# Patient Record
Sex: Male | Born: 1935 | Race: White | Hispanic: No | Marital: Married | Smoking: Former smoker
Health system: Southern US, Community
[De-identification: ages and names within clinical notes are randomized; demographics above are authoritative.]

## PROBLEM LIST (undated history)

## (undated) DIAGNOSIS — N400 Enlarged prostate without lower urinary tract symptoms: Secondary | ICD-10-CM

## (undated) DIAGNOSIS — K219 Gastro-esophageal reflux disease without esophagitis: Secondary | ICD-10-CM

## (undated) DIAGNOSIS — G629 Polyneuropathy, unspecified: Secondary | ICD-10-CM

## (undated) DIAGNOSIS — I639 Cerebral infarction, unspecified: Secondary | ICD-10-CM

## (undated) DIAGNOSIS — I10 Essential (primary) hypertension: Secondary | ICD-10-CM

## (undated) DIAGNOSIS — I6529 Occlusion and stenosis of unspecified carotid artery: Secondary | ICD-10-CM

## (undated) DIAGNOSIS — I4891 Unspecified atrial fibrillation: Secondary | ICD-10-CM

## (undated) DIAGNOSIS — R519 Headache, unspecified: Secondary | ICD-10-CM

## (undated) DIAGNOSIS — H409 Unspecified glaucoma: Secondary | ICD-10-CM

## (undated) DIAGNOSIS — E119 Type 2 diabetes mellitus without complications: Secondary | ICD-10-CM

## (undated) DIAGNOSIS — R51 Headache: Secondary | ICD-10-CM

## (undated) DIAGNOSIS — C801 Malignant (primary) neoplasm, unspecified: Secondary | ICD-10-CM

## (undated) DIAGNOSIS — M1990 Unspecified osteoarthritis, unspecified site: Secondary | ICD-10-CM

## (undated) DIAGNOSIS — I509 Heart failure, unspecified: Secondary | ICD-10-CM

## (undated) HISTORY — PX: OTHER SURGICAL HISTORY: SHX169

## (undated) HISTORY — PX: TRANSURETHRAL RESECTION OF PROSTATE: SHX73

## (undated) HISTORY — PX: CAROTID ENDARTERECTOMY: SUR193

---

## 2004-09-04 ENCOUNTER — Ambulatory Visit: Payer: Self-pay | Admitting: Cardiology

## 2004-09-11 ENCOUNTER — Ambulatory Visit: Payer: Self-pay | Admitting: Cardiology

## 2007-07-06 ENCOUNTER — Ambulatory Visit: Payer: Self-pay | Admitting: Cardiology

## 2011-04-02 DIAGNOSIS — R079 Chest pain, unspecified: Secondary | ICD-10-CM

## 2011-04-02 DIAGNOSIS — I4891 Unspecified atrial fibrillation: Secondary | ICD-10-CM

## 2011-04-03 DIAGNOSIS — I2510 Atherosclerotic heart disease of native coronary artery without angina pectoris: Secondary | ICD-10-CM

## 2011-04-03 DIAGNOSIS — R072 Precordial pain: Secondary | ICD-10-CM

## 2012-02-19 DIAGNOSIS — I639 Cerebral infarction, unspecified: Secondary | ICD-10-CM

## 2012-02-19 HISTORY — DX: Cerebral infarction, unspecified: I63.9

## 2015-07-02 DIAGNOSIS — R0789 Other chest pain: Secondary | ICD-10-CM | POA: Diagnosis not present

## 2015-07-02 DIAGNOSIS — Z7984 Long term (current) use of oral hypoglycemic drugs: Secondary | ICD-10-CM | POA: Diagnosis not present

## 2015-07-02 DIAGNOSIS — I482 Chronic atrial fibrillation: Secondary | ICD-10-CM | POA: Diagnosis not present

## 2015-07-02 DIAGNOSIS — R079 Chest pain, unspecified: Secondary | ICD-10-CM | POA: Diagnosis not present

## 2015-07-02 DIAGNOSIS — I10 Essential (primary) hypertension: Secondary | ICD-10-CM | POA: Diagnosis not present

## 2015-07-02 DIAGNOSIS — Z79899 Other long term (current) drug therapy: Secondary | ICD-10-CM | POA: Diagnosis not present

## 2015-07-02 DIAGNOSIS — E119 Type 2 diabetes mellitus without complications: Secondary | ICD-10-CM | POA: Diagnosis not present

## 2015-07-03 DIAGNOSIS — I517 Cardiomegaly: Secondary | ICD-10-CM | POA: Diagnosis not present

## 2015-07-03 DIAGNOSIS — R079 Chest pain, unspecified: Secondary | ICD-10-CM | POA: Diagnosis not present

## 2015-07-03 DIAGNOSIS — I080 Rheumatic disorders of both mitral and aortic valves: Secondary | ICD-10-CM | POA: Diagnosis not present

## 2015-07-04 DIAGNOSIS — R079 Chest pain, unspecified: Secondary | ICD-10-CM | POA: Diagnosis not present

## 2015-07-11 DIAGNOSIS — M4316 Spondylolisthesis, lumbar region: Secondary | ICD-10-CM | POA: Diagnosis not present

## 2015-07-11 DIAGNOSIS — M47817 Spondylosis without myelopathy or radiculopathy, lumbosacral region: Secondary | ICD-10-CM | POA: Diagnosis not present

## 2015-07-11 DIAGNOSIS — S32009A Unspecified fracture of unspecified lumbar vertebra, initial encounter for closed fracture: Secondary | ICD-10-CM | POA: Diagnosis not present

## 2015-08-01 DIAGNOSIS — Z9889 Other specified postprocedural states: Secondary | ICD-10-CM | POA: Diagnosis not present

## 2015-08-01 DIAGNOSIS — M47817 Spondylosis without myelopathy or radiculopathy, lumbosacral region: Secondary | ICD-10-CM | POA: Diagnosis not present

## 2015-08-01 DIAGNOSIS — M47816 Spondylosis without myelopathy or radiculopathy, lumbar region: Secondary | ICD-10-CM | POA: Diagnosis not present

## 2015-08-01 DIAGNOSIS — M4806 Spinal stenosis, lumbar region: Secondary | ICD-10-CM | POA: Diagnosis not present

## 2015-08-03 DIAGNOSIS — M47817 Spondylosis without myelopathy or radiculopathy, lumbosacral region: Secondary | ICD-10-CM | POA: Diagnosis not present

## 2015-08-03 DIAGNOSIS — M4316 Spondylolisthesis, lumbar region: Secondary | ICD-10-CM | POA: Diagnosis not present

## 2016-05-09 ENCOUNTER — Inpatient Hospital Stay (HOSPITAL_COMMUNITY)
Admission: AD | Admit: 2016-05-09 | Discharge: 2016-05-17 | DRG: 375 | Disposition: A | Discharge status: Home Health | Payer: PPO | Source: Other Acute Inpatient Hospital | Attending: Internal Medicine | Admitting: Internal Medicine

## 2016-05-09 ENCOUNTER — Encounter (HOSPITAL_COMMUNITY): Payer: Self-pay | Admitting: *Deleted

## 2016-05-09 DIAGNOSIS — R933 Abnormal findings on diagnostic imaging of other parts of digestive tract: Secondary | ICD-10-CM | POA: Diagnosis not present

## 2016-05-09 DIAGNOSIS — R112 Nausea with vomiting, unspecified: Secondary | ICD-10-CM | POA: Diagnosis not present

## 2016-05-09 DIAGNOSIS — E119 Type 2 diabetes mellitus without complications: Secondary | ICD-10-CM | POA: Diagnosis not present

## 2016-05-09 DIAGNOSIS — E1142 Type 2 diabetes mellitus with diabetic polyneuropathy: Secondary | ICD-10-CM | POA: Diagnosis not present

## 2016-05-09 DIAGNOSIS — D123 Benign neoplasm of transverse colon: Secondary | ICD-10-CM | POA: Diagnosis not present

## 2016-05-09 DIAGNOSIS — R935 Abnormal findings on diagnostic imaging of other abdominal regions, including retroperitoneum: Secondary | ICD-10-CM | POA: Diagnosis not present

## 2016-05-09 DIAGNOSIS — K8689 Other specified diseases of pancreas: Secondary | ICD-10-CM | POA: Diagnosis not present

## 2016-05-09 DIAGNOSIS — Z79899 Other long term (current) drug therapy: Secondary | ICD-10-CM | POA: Diagnosis not present

## 2016-05-09 DIAGNOSIS — E1159 Type 2 diabetes mellitus with other circulatory complications: Secondary | ICD-10-CM | POA: Diagnosis not present

## 2016-05-09 DIAGNOSIS — I429 Cardiomyopathy, unspecified: Secondary | ICD-10-CM | POA: Diagnosis present

## 2016-05-09 DIAGNOSIS — E1151 Type 2 diabetes mellitus with diabetic peripheral angiopathy without gangrene: Secondary | ICD-10-CM | POA: Diagnosis present

## 2016-05-09 DIAGNOSIS — K56699 Other intestinal obstruction unspecified as to partial versus complete obstruction: Secondary | ICD-10-CM | POA: Diagnosis present

## 2016-05-09 DIAGNOSIS — K6389 Other specified diseases of intestine: Secondary | ICD-10-CM

## 2016-05-09 DIAGNOSIS — I4891 Unspecified atrial fibrillation: Secondary | ICD-10-CM

## 2016-05-09 DIAGNOSIS — K640 First degree hemorrhoids: Secondary | ICD-10-CM | POA: Diagnosis not present

## 2016-05-09 DIAGNOSIS — R1110 Vomiting, unspecified: Secondary | ICD-10-CM | POA: Diagnosis not present

## 2016-05-09 DIAGNOSIS — K639 Disease of intestine, unspecified: Secondary | ICD-10-CM | POA: Diagnosis not present

## 2016-05-09 DIAGNOSIS — C186 Malignant neoplasm of descending colon: Secondary | ICD-10-CM

## 2016-05-09 DIAGNOSIS — C269 Malignant neoplasm of ill-defined sites within the digestive system: Principal | ICD-10-CM | POA: Diagnosis present

## 2016-05-09 DIAGNOSIS — Z01810 Encounter for preprocedural cardiovascular examination: Secondary | ICD-10-CM | POA: Diagnosis not present

## 2016-05-09 DIAGNOSIS — N400 Enlarged prostate without lower urinary tract symptoms: Secondary | ICD-10-CM | POA: Diagnosis present

## 2016-05-09 DIAGNOSIS — R945 Abnormal results of liver function studies: Secondary | ICD-10-CM | POA: Diagnosis not present

## 2016-05-09 DIAGNOSIS — R1902 Left upper quadrant abdominal swelling, mass and lump: Secondary | ICD-10-CM | POA: Diagnosis not present

## 2016-05-09 DIAGNOSIS — C252 Malignant neoplasm of tail of pancreas: Secondary | ICD-10-CM | POA: Diagnosis not present

## 2016-05-09 DIAGNOSIS — R531 Weakness: Secondary | ICD-10-CM | POA: Diagnosis not present

## 2016-05-09 DIAGNOSIS — Z8619 Personal history of other infectious and parasitic diseases: Secondary | ICD-10-CM | POA: Diagnosis not present

## 2016-05-09 DIAGNOSIS — Z886 Allergy status to analgesic agent status: Secondary | ICD-10-CM | POA: Diagnosis not present

## 2016-05-09 DIAGNOSIS — R7989 Other specified abnormal findings of blood chemistry: Secondary | ICD-10-CM | POA: Diagnosis not present

## 2016-05-09 DIAGNOSIS — F0390 Unspecified dementia without behavioral disturbance: Secondary | ICD-10-CM | POA: Diagnosis not present

## 2016-05-09 DIAGNOSIS — Z7901 Long term (current) use of anticoagulants: Secondary | ICD-10-CM

## 2016-05-09 DIAGNOSIS — K5730 Diverticulosis of large intestine without perforation or abscess without bleeding: Secondary | ICD-10-CM | POA: Diagnosis not present

## 2016-05-09 DIAGNOSIS — I255 Ischemic cardiomyopathy: Secondary | ICD-10-CM | POA: Diagnosis not present

## 2016-05-09 DIAGNOSIS — R1012 Left upper quadrant pain: Secondary | ICD-10-CM | POA: Diagnosis not present

## 2016-05-09 DIAGNOSIS — Z7189 Other specified counseling: Secondary | ICD-10-CM

## 2016-05-09 DIAGNOSIS — Z8673 Personal history of transient ischemic attack (TIA), and cerebral infarction without residual deficits: Secondary | ICD-10-CM

## 2016-05-09 DIAGNOSIS — C189 Malignant neoplasm of colon, unspecified: Secondary | ICD-10-CM | POA: Diagnosis not present

## 2016-05-09 DIAGNOSIS — R188 Other ascites: Secondary | ICD-10-CM | POA: Diagnosis not present

## 2016-05-09 DIAGNOSIS — I5022 Chronic systolic (congestive) heart failure: Secondary | ICD-10-CM | POA: Diagnosis present

## 2016-05-09 DIAGNOSIS — Z87891 Personal history of nicotine dependence: Secondary | ICD-10-CM | POA: Diagnosis not present

## 2016-05-09 DIAGNOSIS — E440 Moderate protein-calorie malnutrition: Secondary | ICD-10-CM | POA: Diagnosis not present

## 2016-05-09 DIAGNOSIS — I110 Hypertensive heart disease with heart failure: Secondary | ICD-10-CM | POA: Diagnosis not present

## 2016-05-09 DIAGNOSIS — I700 Atherosclerosis of aorta: Secondary | ICD-10-CM | POA: Diagnosis not present

## 2016-05-09 DIAGNOSIS — K56609 Unspecified intestinal obstruction, unspecified as to partial versus complete obstruction: Secondary | ICD-10-CM | POA: Diagnosis not present

## 2016-05-09 DIAGNOSIS — K635 Polyp of colon: Secondary | ICD-10-CM | POA: Diagnosis not present

## 2016-05-09 DIAGNOSIS — K219 Gastro-esophageal reflux disease without esophagitis: Secondary | ICD-10-CM | POA: Diagnosis not present

## 2016-05-09 DIAGNOSIS — I482 Chronic atrial fibrillation: Secondary | ICD-10-CM | POA: Diagnosis not present

## 2016-05-09 DIAGNOSIS — R110 Nausea: Secondary | ICD-10-CM

## 2016-05-09 DIAGNOSIS — Z794 Long term (current) use of insulin: Secondary | ICD-10-CM | POA: Diagnosis not present

## 2016-05-09 DIAGNOSIS — I420 Dilated cardiomyopathy: Secondary | ICD-10-CM | POA: Diagnosis not present

## 2016-05-09 DIAGNOSIS — R1900 Intra-abdominal and pelvic swelling, mass and lump, unspecified site: Secondary | ICD-10-CM

## 2016-05-09 DIAGNOSIS — D490 Neoplasm of unspecified behavior of digestive system: Secondary | ICD-10-CM | POA: Diagnosis not present

## 2016-05-09 DIAGNOSIS — Z515 Encounter for palliative care: Secondary | ICD-10-CM

## 2016-05-09 DIAGNOSIS — R109 Unspecified abdominal pain: Secondary | ICD-10-CM

## 2016-05-09 DIAGNOSIS — I517 Cardiomegaly: Secondary | ICD-10-CM | POA: Diagnosis not present

## 2016-05-09 DIAGNOSIS — K5669 Other partial intestinal obstruction: Secondary | ICD-10-CM | POA: Diagnosis not present

## 2016-05-09 DIAGNOSIS — I10 Essential (primary) hypertension: Secondary | ICD-10-CM | POA: Diagnosis not present

## 2016-05-09 HISTORY — DX: Type 2 diabetes mellitus without complications: E11.9

## 2016-05-09 HISTORY — DX: Occlusion and stenosis of unspecified carotid artery: I65.29

## 2016-05-09 HISTORY — DX: Polyneuropathy, unspecified: G62.9

## 2016-05-09 HISTORY — DX: Essential (primary) hypertension: I10

## 2016-05-09 HISTORY — DX: Benign prostatic hyperplasia without lower urinary tract symptoms: N40.0

## 2016-05-09 HISTORY — DX: Unspecified atrial fibrillation: I48.91

## 2016-05-09 LAB — GLUCOSE, CAPILLARY
GLUCOSE-CAPILLARY: 61 mg/dL — AB (ref 65–99)
Glucose-Capillary: 76 mg/dL (ref 65–99)

## 2016-05-09 LAB — APTT: aPTT: 34 seconds (ref 24–36)

## 2016-05-09 LAB — TROPONIN I: Troponin I: <0.03 ng/mL (ref <0.03)

## 2016-05-09 MED ORDER — INSULIN GLARGINE 100 UNIT/ML ~~LOC~~ SOLN
20.0000 [IU] | Freq: Every day | SUBCUTANEOUS | Status: DC
  Filled 2016-05-09: qty 0.2

## 2016-05-09 MED ORDER — METOPROLOL TARTRATE 5 MG/5ML IV SOLN
2.5000 mg | INTRAVENOUS | Status: AC
  Administered 2016-05-09: 2.5 mg via INTRAVENOUS

## 2016-05-09 MED ORDER — DILTIAZEM HCL ER COATED BEADS 120 MG PO CP24
120.0000 mg | ORAL_CAPSULE | Freq: Every day | ORAL | Status: DC
  Administered 2016-05-10: 120 mg via ORAL
  Filled 2016-05-09: qty 1

## 2016-05-09 MED ORDER — MORPHINE SULFATE (PF) 4 MG/ML IV SOLN: 2.0000 mg | INTRAVENOUS | Status: DC | PRN

## 2016-05-09 MED ORDER — LATANOPROST 0.005 % OP SOLN
1.0000 [drp] | Freq: Every day | OPHTHALMIC | Status: DC
  Administered 2016-05-09 – 2016-05-16 (×8): 1 [drp] via OPHTHALMIC
  Filled 2016-05-09: qty 2.5

## 2016-05-09 MED ORDER — HEPARIN (PORCINE) IN NACL 100-0.45 UNIT/ML-% IJ SOLN: 1000.0000 [IU]/h | INTRAMUSCULAR | Status: DC

## 2016-05-09 MED ORDER — HEPARIN BOLUS VIA INFUSION
2000.0000 [IU] | Freq: Once | INTRAVENOUS | Status: DC
  Filled 2016-05-09: qty 2000

## 2016-05-09 MED ORDER — METOPROLOL TARTRATE 25 MG PO TABS
25.0000 mg | ORAL_TABLET | Freq: Two times a day (BID) | ORAL | Status: DC
  Administered 2016-05-09 – 2016-05-15 (×12): 25 mg via ORAL
  Filled 2016-05-09 (×13): qty 1

## 2016-05-09 MED ORDER — METOPROLOL TARTRATE 5 MG/5ML IV SOLN
INTRAVENOUS | Status: AC
  Administered 2016-05-09: 2.5 mg
  Filled 2016-05-09: qty 5

## 2016-05-09 MED ORDER — ONDANSETRON HCL 4 MG/2ML IJ SOLN
4.0000 mg | Freq: Four times a day (QID) | INTRAMUSCULAR | Status: DC | PRN
  Administered 2016-05-09 – 2016-05-14 (×4): 4 mg via INTRAVENOUS
  Filled 2016-05-09 (×4): qty 2

## 2016-05-09 MED ORDER — HEPARIN (PORCINE) IN NACL 100-0.45 UNIT/ML-% IJ SOLN
1000.0000 [IU]/h | INTRAMUSCULAR | Status: DC
  Administered 2016-05-09 – 2016-05-10 (×2): 1000 [IU]/h via INTRAVENOUS
  Filled 2016-05-09 (×2): qty 250

## 2016-05-09 MED ORDER — GABAPENTIN 300 MG PO CAPS
300.0000 mg | ORAL_CAPSULE | Freq: Every day | ORAL | Status: DC
  Administered 2016-05-10 – 2016-05-16 (×7): 300 mg via ORAL
  Filled 2016-05-09 (×8): qty 1

## 2016-05-09 MED ORDER — POLYVINYL ALCOHOL 1.4 % OP SOLN
1.0000 [drp] | Freq: Two times a day (BID) | OPHTHALMIC | Status: DC
  Administered 2016-05-10 – 2016-05-17 (×12): 1 [drp] via OPHTHALMIC
  Filled 2016-05-09: qty 15

## 2016-05-09 MED ORDER — ACETAMINOPHEN 325 MG PO TABS
650.0000 mg | ORAL_TABLET | ORAL | Status: DC | PRN
  Administered 2016-05-10 – 2016-05-16 (×5): 650 mg via ORAL
  Filled 2016-05-09 (×5): qty 2

## 2016-05-09 MED ORDER — INSULIN ASPART 100 UNIT/ML ~~LOC~~ SOLN
0.0000 [IU] | Freq: Three times a day (TID) | SUBCUTANEOUS | Status: DC
  Administered 2016-05-10: 5 [IU] via SUBCUTANEOUS
  Administered 2016-05-10: 3 [IU] via SUBCUTANEOUS
  Administered 2016-05-11: 8 [IU] via SUBCUTANEOUS
  Administered 2016-05-11: 3 [IU] via SUBCUTANEOUS
  Administered 2016-05-12: 11 [IU] via SUBCUTANEOUS
  Administered 2016-05-12 – 2016-05-13 (×3): 3 [IU] via SUBCUTANEOUS
  Administered 2016-05-13 – 2016-05-16 (×5): 2 [IU] via SUBCUTANEOUS

## 2016-05-09 NOTE — H&P (Signed)
History and Physical    Benjamin Hurley TDD:[id number] DOB: 12-Mar-1925 DOA: 05/09/2016  PCP: [place] Consultants:  Cardiology; GI; Urology; Endocrinology - all through the [place] Patient coming from: home - lives with wife; NOK: wife, [phone]  Chief Complaint: abdominal pain  HPI: Benjamin Hurley is a 83 y.o. male with medical history significant of DM with peripheral neuropathy; HTN; carotid steonsis; BPH; and afib on Eliquis presenting as a transfer from [facility] with diagnosis of new-onset colon cancer.  His family reports that he remotely had back surgery - and had strokes before and after surgery.  About 3-5 years ago, he started with GI symptoms - midepigastric pain, nausea.  C diff x 2 (remote and recently).  Has been nauseated 24/7.  Thought maybe his symptoms were due to Metoprolol, changed medication within the last month to Diltiazem.  Since then, his heart rate has been very elevated.  When he was hospitalized 2 weeks ago for severe C. Diff, they were adding Metoprolol to the Cardizem.  Came home prior to medication being completed.  A week later, he went back to the [place].  Heart rate was back up again and was admitted again.  Heart rate back down and discharged again.  Given Carafate and Phenergan.  He again started throwing up yesterday and this AM and stools became loose today.  They took him to [facility] today and he had CXR, CT.  They told him it was perforation and then came back to report free air and a "big tumor" in his colon, that it was colon cancer.  10 pounds weight GAIN over 2 weeks.  Wife reports he is not eating hardly anything.  He has never had a colonoscopy/flex sig in his life and also never did annual stool cards.  Abdominal cramping.  Loose stools yesterday x 2, again today (usually 2 daily but these are more loose).  No fevers.  At [facility], pertinent info:  HR 150, given IV metoprolol Lactate 1.6, 1.7 Troponin 0.02, 0.03 BNP 4972 Magnesium  1.2 CT with primary colon cancer in the proximal descending colon and infiltrating the splenic hilum and spleen.  This is resulting in a colon and distal small bowel obstruction.  There is a small amount of free air.  The mass is 6.2 x 4.8 cm.  Review of Systems: As per HPI; otherwise 10 point review of systems reviewed and negative.   Ambulatory Status:  ambulates with a walker  Past Medical History:  Diagnosis Date  . A-fib ([place])    on Eliquis  . BPH (benign prostatic hyperplasia)   . Carotid stenosis   . Diabetes ([place])   . Essential hypertension   . Peripheral neuropathy [facility])     Past Surgical History:  Procedure Laterality Date  . CAROTID ENDARTERECTOMY Bilateral   . lumber spine    . TRANSURETHRAL RESECTION OF PROSTATE      Social History   Social History  . Marital status: Married    Spouse name: N/A  . Number of children: N/A  . Years of education: N/A   Occupational History  . retired    Social History Main Topics  . Smoking status: Former Smoker    Types: Pipe    Quit date: 1968  . Smokeless tobacco: Former Systems developer    Types: Chew    Quit date: 1968  . Alcohol use No  . Drug use: No  . Sexual activity: Not on file   Other Topics Concern  .  Not on file   Social History Narrative  . No narrative on file    No Known Allergies  History reviewed. No pertinent family history.  Prior to Admission medications   Not on File    Physical Exam: Vitals:   05/09/16 1804 05/09/16 2014 05/09/16 2100 05/09/16 2153  BP:  118/71    Pulse:  (!) 138  (!) 120  Temp:  97.4 F (36.3 C)    TempSrc:  Oral    SpO2:  98%    Weight: 71.9 kg (158 lb 8.2 oz)  71.9 kg (158 lb 8.2 oz)   Height:   5\' 6"  (1.676 m)      General: Appears calm and comfortable and is NAD Eyes:  PERRL, EOMI, normal lids, iris ENT:  grossly normal hearing, lips & tongue, mmm Neck:  no LAD, masses or thyromegaly Cardiovascular: Irregularly irregular with tachycardia, no m/r/g. No LE edema.   Respiratory:  CTA bilaterally, no w/r/r. Normal respiratory effort. Abdomen:  soft, mildly diffusely tender, nd, NABS Skin:  no rash or induration seen on limited exam Musculoskeletal:  grossly normal tone BUE/BLE, good ROM, no bony abnormality Psychiatric:  grossly normal mood and affect, speech fluent and appropriate, AOx3 Neurologic:  CN 2-12 grossly intact, moves all extremities in coordinated fashion, sensation intact  Labs on Admission: I have personally reviewed following labs and imaging studies  CBC: No results for input(s): WBC, NEUTROABS, HGB, HCT, MCV, PLT in the last 168 hours. Basic Metabolic Panel: No results for input(s): NA, K, CL, CO2, GLUCOSE, BUN, CREATININE, CALCIUM, MG, PHOS in the last 168 hours. GFR: CrCl cannot be calculated (No order found.). Liver Function Tests: No results for input(s): AST, ALT, ALKPHOS, BILITOT, PROT, ALBUMIN in the last 168 hours. No results for input(s): LIPASE, AMYLASE in the last 168 hours. No results for input(s): AMMONIA in the last 168 hours. Coagulation Profile: No results for input(s): INR, PROTIME in the last 168 hours. Cardiac Enzymes:  Recent Labs Lab 05/09/16 2057  TROPONINI <0.03   BNP (last 3 results) No results for input(s): PROBNP in the last 8760 hours. HbA1C: No results for input(s): HGBA1C in the last 72 hours. CBG:  Recent Labs Lab 05/09/16 2011 05/09/16 2151  GLUCAP 61* 76   Lipid Profile: No results for input(s): CHOL, HDL, LDLCALC, TRIG, CHOLHDL, LDLDIRECT in the last 72 hours. Thyroid Function Tests: No results for input(s): TSH, T4TOTAL, FREET4, T3FREE, THYROIDAB in the last 72 hours. Anemia Panel: No results for input(s): VITAMINB12, FOLATE, FERRITIN, TIBC, IRON, RETICCTPCT in the last 72 hours. Urine analysis: No results found for: COLORURINE, APPEARANCEUR, LABSPEC, PHURINE, GLUCOSEU, HGBUR, BILIRUBINUR, KETONESUR, PROTEINUR, UROBILINOGEN, NITRITE, LEUKOCYTESUR  Creatinine Clearance: CrCl  cannot be calculated (No order found.).  Sepsis Labs: @LABRCNTIP (procalcitonin:4,lacticidven:4) )No results found for this or any previous visit (from the past 240 hour(s)).   Radiological Exams on Admission: No results found.  EKG: not done  Assessment/Plan Principal Problem:   Colon cancer (HCC) Active Problems:   Atrial fibrillation with RVR (HCC)   Hypomagnesemia   Diabetes mellitus, type 2 (HCC)   Colon cancer -This is a new diagnosis -At his age and with afib, this is likely to be a terminal condition -He does not appear to have ever had colon cancer screening -He does appear to have a partial obstruction and possibly a small amount of free air -His nausea is currently controlled and he requests clear liquids and no NG tube; he understands that an NG tube could help  to allay his nausea and he is encouraged to request it if needed -Suggest surgery consult in AM to determine if this is an operable condition -Also suggest oncology in the AM to further assess what treatment options are available -With that information available, the family can then decide how to proceed.  They understand that comfort care may be the most appropriate route. -They are interested in meeting with palliative care and this consult has been placed. -He is on Eliquis (last dose 530 pm on 3/22) and so will need to be off this medication prior to any surgical interventions, if needed. -Pain control with morphine -Nausea control with Zofran for now, although NG tube placement remains a good option  Afib with RVR -Patient presenting with known afib with RVR.  -If possible, will try to control HR with IV pushes of metoprolol; per report, this was successful at the [place] and [facility]. -If this is unsuccessful, he will need to transfer to the SDU for Diltiazem drip as per protocol with plan to transition to PO Diltiazem once heart rate is controlled.   -Will continue home PO Cardizem for now. Will cycle  cardiac enzymes, as they were minimally elevated at [facility] (likely demand ischemia) -His CHA2DS2-VASc Score is 7 with an annual stroke risk of 11.2%/year.  This is an extremely high stroke risk, and by the family's description it is possible that he previously stroked twice in the short peri-operative period for back surgery in the past.  -He has been taking Eliquis at home; this has been held since 3/22pm. -However, given his extremely high stroke risk, he will need bridge therapy with a Heparin drip until all possible procedures are completed. -Heparin dosing per pharmacy has been ordered.  DM -Continue Lantus -Cover with SSI   Hypomagnesemia -Replete and recheck in AM  DVT prophylaxis: Heparin drip Code Status:  Full - confirmed with patient/family Family Communication: Wife and daughter present throughout; we also talked privately for approximately 40 minutes for a total encounter time of 90+ minutes  Disposition Plan: To be determined Consults called: Palliative; will need surgery and oncology consults called in the AM Admission status: Admit - It is my clinical opinion that admission to INPATIENT is reasonable and necessary because this patient will require at least 2 midnights in the hospital to treat this condition based on the medical complexity of the problems presented.  Given the aforementioned information, the predictability of an adverse outcome is felt to be significant.    Karmen Bongo MD [facility]  If 7PM-7AM, please contact night-coverage [email or web address] Password [facility]  05/10/2016, 2:24 AM

## 2016-05-09 NOTE — Progress Notes (Signed)
[facility] for Heparin Indication: atrial fibrillation  No Known Allergies  Patient Measurements: Height: 5\' 6"  (167.6 cm) Weight: 158 lb 8.2 oz (71.9 kg) IBW/kg (Calculated) : 63.8 Heparin Dosing Weight: 71.9  Vital Signs: Temp: 97.4 F (36.3 C) (03/22 2014) Temp Source: Oral (03/22 2014) BP: 118/71 (03/22 2014) Pulse Rate: 138 (03/22 2014)  Labs: No results for input(s): HGB, HCT, PLT, APTT, LABPROT, INR, HEPARINUNFRC, HEPRLOWMOCWT, CREATININE, CKTOTAL, CKMB, TROPONINI in the last 72 hours.  CrCl cannot be calculated (No order found.).  Medical History: Past Medical History:  Diagnosis Date  . A-fib ([place])    on Eliquis  . BPH (benign prostatic hyperplasia)   . Carotid stenosis   . Diabetes ([place])   . Essential hypertension   . Peripheral neuropathy (HCC)    Medications:  Scheduled:  . [START ON 05/10/2016] insulin aspart  0-15 Units Subcutaneous TID WC  . metoprolol tartrate  25 mg Oral BID   Infusions:  . heparin     Assessment: 62 yoM admitted with probable colon perforation and mass, in Afib, seen prev at [place]: for elevated HR - tried Diltiazem & Metoprolol, anti-nausea meds. Hx CVA, on Apixaban 5mg  bid, LD 3/21 at 1700  Begin Heparin infusion for Afib, no bolus  aPTT ordered for baseline, use to measure Apixaban effect  Once aPTT and Heparin level correlate can monitor using only Heparin levels  Goal of Therapy:  APTT 66-102 sec Heparin level 0.3-0.7 units/ml Monitor platelets by anticoagulation protocol: Yes   Plan:   Begin Heparin infusion at 1000 units/hr, no bolus  APTT and Heparin level at 0500  Minda Ditto PharmD Pager [phone] 05/09/2016, 10:36 PM

## 2016-05-10 DIAGNOSIS — Z7189 Other specified counseling: Secondary | ICD-10-CM

## 2016-05-10 DIAGNOSIS — E119 Type 2 diabetes mellitus without complications: Secondary | ICD-10-CM

## 2016-05-10 DIAGNOSIS — Z515 Encounter for palliative care: Secondary | ICD-10-CM

## 2016-05-10 DIAGNOSIS — I4891 Unspecified atrial fibrillation: Secondary | ICD-10-CM | POA: Diagnosis present

## 2016-05-10 DIAGNOSIS — R110 Nausea: Secondary | ICD-10-CM

## 2016-05-10 LAB — COMPREHENSIVE METABOLIC PANEL
ALBUMIN: 3.4 g/dL — AB (ref 3.5–5.0)
ALK PHOS: 68 U/L (ref 38–126)
ALT: 14 U/L — AB (ref 17–63)
AST: 31 U/L (ref 15–41)
Anion gap: 8 (ref 5–15)
BUN: 18 mg/dL (ref 6–20)
CHLORIDE: 101 mmol/L (ref 101–111)
CO2: 21 mmol/L — ABNORMAL LOW (ref 22–32)
Calcium: 8.5 mg/dL — ABNORMAL LOW (ref 8.9–10.3)
Creatinine, Ser: 1.07 mg/dL (ref 0.61–1.24)
GFR calc Af Amer: >60 mL/min (ref >60)
GFR calc non Af Amer: 60 mL/min — ABNORMAL LOW (ref >60)
GLUCOSE: 226 mg/dL — AB (ref 65–99)
Potassium: 4.5 mmol/L (ref 3.5–5.1)
Sodium: 130 mmol/L — ABNORMAL LOW (ref 135–145)
Total Bilirubin: 0.7 mg/dL (ref 0.3–1.2)
Total Protein: 6.8 g/dL (ref 6.5–8.1)

## 2016-05-10 LAB — TROPONIN I: Troponin I: <0.03 ng/mL (ref <0.03)

## 2016-05-10 LAB — GLUCOSE, CAPILLARY
GLUCOSE-CAPILLARY: 161 mg/dL — AB (ref 65–99)
Glucose-Capillary: 79 mg/dL (ref 65–99)
Glucose-Capillary: 243 mg/dL — ABNORMAL HIGH (ref 65–99)
Glucose-Capillary: 70 mg/dL (ref 65–99)

## 2016-05-10 LAB — BASIC METABOLIC PANEL
ANION GAP: 8 (ref 5–15)
BUN: 18 mg/dL (ref 6–20)
CALCIUM: 8.8 mg/dL — AB (ref 8.9–10.3)
CO2: 22 mmol/L (ref 22–32)
CREATININE: 1.01 mg/dL (ref 0.61–1.24)
Chloride: 104 mmol/L (ref 101–111)
Glucose, Bld: 85 mg/dL (ref 65–99)
Potassium: 4.8 mmol/L (ref 3.5–5.1)
SODIUM: 134 mmol/L — AB (ref 135–145)

## 2016-05-10 LAB — GASTROINTESTINAL PANEL BY PCR, STOOL (REPLACES STOOL CULTURE)

## 2016-05-10 LAB — APTT
aPTT: 91 seconds — ABNORMAL HIGH (ref 24–36)
aPTT: 83 seconds — ABNORMAL HIGH (ref 24–36)

## 2016-05-10 LAB — CBC
HCT: 37.1 % — ABNORMAL LOW (ref 39.0–52.0)
Hemoglobin: 12.5 g/dL — ABNORMAL LOW (ref 13.0–17.0)
MCH: 30.6 pg (ref 26.0–34.0)
MCHC: 33.7 g/dL (ref 30.0–36.0)
MCV: 90.7 fL (ref 78.0–100.0)
Platelets: 293 10*3/uL (ref 150–400)
RBC: 4.09 MIL/uL — ABNORMAL LOW (ref 4.22–5.81)
RDW: 14.6 % (ref 11.5–15.5)
WBC: 5.6 10*3/uL (ref 4.0–10.5)

## 2016-05-10 LAB — C DIFFICILE QUICK SCREEN W PCR REFLEX
C DIFFICILE (CDIFF) TOXIN: NEGATIVE
C DIFFICLE (CDIFF) ANTIGEN: NEGATIVE
C Diff interpretation: NOT DETECTED

## 2016-05-10 LAB — HEPARIN LEVEL (UNFRACTIONATED)

## 2016-05-10 LAB — MAGNESIUM: Magnesium: 1.9 mg/dL (ref 1.7–2.4)

## 2016-05-10 MED ORDER — DILTIAZEM HCL 100 MG IV SOLR
5.0000 mg/h | INTRAVENOUS | Status: DC
  Administered 2016-05-10 – 2016-05-15 (×7): 5 mg/h via INTRAVENOUS
  Filled 2016-05-10 (×10): qty 100

## 2016-05-10 MED ORDER — INSULIN GLARGINE 100 UNIT/ML ~~LOC~~ SOLN
5.0000 [IU] | Freq: Every day | SUBCUTANEOUS | Status: DC
  Filled 2016-05-10 (×2): qty 0.05

## 2016-05-10 MED ORDER — BOOST / RESOURCE BREEZE PO LIQD
1.0000 | Freq: Three times a day (TID) | ORAL | Status: DC
  Administered 2016-05-10 – 2016-05-16 (×9): 1 via ORAL

## 2016-05-10 MED ORDER — DEXTROSE 5 % IV SOLN
INTRAVENOUS | Status: DC
  Administered 2016-05-10 – 2016-05-11 (×3): via INTRAVENOUS

## 2016-05-10 MED ORDER — GLUCERNA SHAKE PO LIQD
237.0000 mL | Freq: Three times a day (TID) | ORAL | Status: DC
  Administered 2016-05-10 – 2016-05-14 (×6): 237 mL via ORAL
  Filled 2016-05-10 (×22): qty 237

## 2016-05-10 MED ORDER — MAGNESIUM SULFATE 2 GM/50ML IV SOLN
2.0000 g | Freq: Once | INTRAVENOUS | Status: AC
  Administered 2016-05-10: 2 g via INTRAVENOUS
  Filled 2016-05-10: qty 50

## 2016-05-10 NOTE — Consult Note (Signed)
Reason for Consult:  Colon cancer Referring Physician: Chauncey Cruel Benjamin Hurley is an 83 y.o. male.   HPI: Elderly male transferred from [facility] yesterday evening with a history of recurring C diff colitis, treated at the [place] ? He reports issues with abdominal pain and nausea for 2 years. All his care has been at the [facility] [place]. We do not have records for this. It sounds like he presented to the [place] there about 2 weeks ago and was admitted for uncontrolled atrial fibrillation with rapid ventricular rate. During that time he also was treated for C. difficile colitis. He reports his first admission was about 6 days. He was sent home he returned a few days later was admitted for another 24 hours. Is sounds like both times the primary issue was his atrial fibrillation. Neither ureter is wife can remember exact dates for any of these events. His daughter seems to be the primary person to speak to about times and dates in his medical history. He presented to [facility] yesterday.  He reported having nausea and vomiting yesterday with loose stools at that time.  He had a CXR or three-way film that appeared to have possible free air. A CT scan was then performed. CT scan shows a mass extending from the descending colon into the inferior splenic hilum and into the inferior portion of the spleen. Findings are compatible with the descending colon cancer invading the inferior splenic hilum and spleen. This was causing a colon and distal small bowel obstruction. There was a small amount of peritoneal fluid that was free. Proximal sigmoid, distal and mid descending colon show diverticulosis. He has coronary artery and aortic atherosclerosis. Bilateral pleural effusions and diffuse subcutaneous edema. Dr. Redmond Pulling has reviewed the CT findings and films in our radiology department.. Patient was subsequently referred to [facility] hospital for higher level of  care.  Labs here this AM;  shows the BMP is ok, CBC is also normal. Negative C diff. He is on heparin for anticoagulation.  Past Medical History:  Diagnosis Date  . A-fib ([place])    on Eliquis  . BPH (benign prostatic hyperplasia)   . Carotid stenosis   . Diabetes ([place])   . Essential hypertension   . Peripheral neuropathy [facility])     Past Surgical History:  Procedure Laterality Date  . CAROTID ENDARTERECTOMY Bilateral   . lumber spine    . TRANSURETHRAL RESECTION OF PROSTATE     Married wife is with him. EtOH: Rare none for 20-30 years. Tobacco: 2-3 years while he was in the service; 1945 - 1948 Drugs: None He was a Hydrologist for air conditioning units.  History reviewed. No pertinent family history.  Social History:  reports that he quit smoking about 50 years ago. His smoking use included Pipe. He quit smokeless tobacco use about 50 years ago. His smokeless tobacco use included Chew. He reports that he does not drink alcohol or use drugs.  Allergies: No Known Allergies  Prior to Admission medications   Medication Sig Start Date End Date Taking? Authorizing Provider  apixaban (ELIQUIS) 5 MG TABS tablet Take 5 mg by mouth 2 (two) times daily.   Yes Historical Provider, MD  carboxymethylcellulose (REFRESH PLUS) 0.5 % SOLN Place 1 drop into both eyes 2 (two) times daily.   Yes Historical Provider, MD  diltiazem (DILACOR XR) 120 MG 24 hr capsule Take 120 mg by mouth daily.   Yes Historical Provider, MD  feeding supplement, GLUCERNA SHAKE, (GLUCERNA SHAKE) LIQD Take 237 mLs by mouth daily.   Yes Historical Provider, MD  gabapentin (NEURONTIN) 300 MG capsule Take 300 mg by mouth at bedtime. 03/13/16  Yes Historical Provider, MD  insulin glargine (LANTUS) 100 UNIT/ML injection Inject 15-20 Units into the skin at bedtime. Patients wife states that she does a three day cycle. The first two days she gives the patient 20 units. Then on the third night she checks blood sugar. If blood  sugar is under 100 she gives patient 15 units but if it is over 100 she gives the patient 20 units.   Yes Historical Provider, MD  latanoprost (XALATAN) 0.005 % ophthalmic solution Place 1 drop into both eyes at bedtime.   Yes Historical Provider, MD  metFORMIN (GLUCOPHAGE) 500 MG tablet Take 500 mg by mouth 2 (two) times daily with a meal.   Yes Historical Provider, MD  pantoprazole (PROTONIX) 40 MG tablet Take 40 mg by mouth daily.   Yes Historical Provider, MD  promethazine (PHENERGAN) 25 MG tablet Take 12.5 mg by mouth every 4 (four) hours as needed for nausea or vomiting.   Yes Historical Provider, MD  simethicone (MYLICON) 80 MG chewable tablet Chew 80 mg by mouth every 6 (six) hours as needed for flatulence.   Yes Historical Provider, MD  sucralfate (CARAFATE) 1 GM/10ML suspension Take 1 g by mouth 2 (two) times daily.   Yes Historical Provider, MD     Results for orders placed or performed during the hospital encounter of 05/09/16 (from the past 48 hour(s))  Glucose, capillary     Status: Abnormal   Collection Time: 05/09/16  8:11 PM  Result Value Ref Range   Glucose-Capillary 61 (L) 65 - 99 mg/dL  Troponin I     Status: None   Collection Time: 05/09/16  8:57 PM  Result Value Ref Range   Troponin I <0.03 <0.03 ng/mL  Glucose, capillary     Status: None   Collection Time: 05/09/16  9:51 PM  Result Value Ref Range   Glucose-Capillary 76 65 - 99 mg/dL  APTT     Status: None   Collection Time: 05/09/16 10:56 PM  Result Value Ref Range   aPTT 34 24 - 36 seconds  C difficile quick scan w PCR reflex     Status: None   Collection Time: 05/10/16  3:07 AM  Result Value Ref Range   C Diff antigen NEGATIVE NEGATIVE   C Diff toxin NEGATIVE NEGATIVE   C Diff interpretation No C. difficile detected.   Troponin I     Status: None   Collection Time: 05/10/16  5:00 AM  Result Value Ref Range   Troponin I <0.03 <0.03 ng/mL  Basic metabolic panel     Status: Abnormal   Collection Time:  05/10/16  5:00 AM  Result Value Ref Range   Sodium 134 (L) 135 - 145 mmol/L   Potassium 4.8 3.5 - 5.1 mmol/L   Chloride 104 101 - 111 mmol/L   CO2 22 22 - 32 mmol/L   Glucose, Bld 85 65 - 99 mg/dL   BUN 18 6 - 20 mg/dL   Creatinine, Ser 1.01 0.61 - 1.24 mg/dL   Calcium 8.8 (L) 8.9 - 10.3 mg/dL   GFR calc non Af Amer >60 >60 mL/min   GFR calc Af Amer >60 >60 mL/min    Comment: (NOTE) The eGFR has been calculated using the CKD EPI equation. This calculation has not been validated  in all clinical situations. eGFR's persistently <60 mL/min signify possible Chronic Kidney Disease.    Anion gap 8 5 - 15  CBC     Status: Abnormal   Collection Time: 05/10/16  5:00 AM  Result Value Ref Range   WBC 5.6 4.0 - 10.5 K/uL   RBC 4.09 (L) 4.22 - 5.81 MIL/uL   Hemoglobin 12.5 (L) 13.0 - 17.0 g/dL   HCT 37.1 (L) 39.0 - 52.0 %   MCV 90.7 78.0 - 100.0 fL   MCH 30.6 26.0 - 34.0 pg   MCHC 33.7 30.0 - 36.0 g/dL   RDW 14.6 11.5 - 15.5 %   Platelets 293 150 - 400 K/uL  Heparin level (unfractionated)     Status: Abnormal   Collection Time: 05/10/16  5:00 AM  Result Value Ref Range   Heparin Unfractionated >2.20 (H) 0.30 - 0.70 IU/mL    Comment: RESULTS CONFIRMED BY MANUAL DILUTION        IF HEPARIN RESULTS ARE BELOW EXPECTED VALUES, AND PATIENT DOSAGE HAS BEEN CONFIRMED, SUGGEST FOLLOW UP TESTING OF ANTITHROMBIN III LEVELS.   APTT     Status: Abnormal   Collection Time: 05/10/16  5:00 AM  Result Value Ref Range   aPTT 91 (H) 24 - 36 seconds    Comment:        IF BASELINE aPTT IS ELEVATED, SUGGEST PATIENT RISK ASSESSMENT BE USED TO DETERMINE APPROPRIATE ANTICOAGULANT THERAPY.   Magnesium     Status: None   Collection Time: 05/10/16  5:00 AM  Result Value Ref Range   Magnesium 1.9 1.7 - 2.4 mg/dL  Glucose, capillary     Status: None   Collection Time: 05/10/16  7:32 AM  Result Value Ref Range   Glucose-Capillary 79 65 - 99 mg/dL    No results found.  Review of Systems   Constitutional: Positive for weight loss (sounds like he is gaining weight, not really sure). Negative for chills and fever.  HENT: Positive for hearing loss.   Eyes: Negative.   Respiratory: Negative for cough, hemoptysis, sputum production, shortness of breath and wheezing.   Cardiovascular: Positive for chest pain and leg swelling (Chronic lower extremity swelling if he does not keep his feet raise.). Negative for palpitations, orthopnea, claudication and PND.  Gastrointestinal: Positive for abdominal pain, diarrhea, heartburn, nausea and vomiting. Negative for blood in stool, constipation and melena.  Genitourinary: Negative.   Musculoskeletal: Positive for back pain (He had back surgery for ruptured disc and since that time has had issues with mobility. He can use walker, and wheelchair.).  Skin: Negative.   Neurological: Negative for dizziness, tingling, tremors, sensory change, speech change, focal weakness, seizures, loss of consciousness and headaches.  Endo/Heme/Allergies: Negative for environmental allergies and polydipsia. Bruises/bleeds easily.  Psychiatric/Behavioral: Positive for depression and memory loss.   Blood pressure 94/77, pulse (!) 123, temperature 98.4 F (36.9 C), temperature source Oral, resp. rate 18, height 5' 6"  (1.676 m), weight 65.9 kg (145 lb 4.5 oz), SpO2 97 %. Physical Exam  Constitutional: He is oriented to person, place, and time.  Elderly male in no acute distress.  HENT:  Head: Normocephalic and atraumatic.  Mouth/Throat: No oropharyngeal exudate.  He has a hearing deficit. His wife says he has hearing aids but he won't wear them.  Eyes: Right eye exhibits no discharge. Left eye exhibits no discharge. No scleral icterus.  Neck: Normal range of motion. Neck supple. No JVD present. No tracheal deviation present. No thyromegaly present.  Bilateral  carotid endarterectomy scars.  Cardiovascular: Normal rate, regular rhythm and normal heart sounds.   No  murmur heard. Respiratory: Effort normal and breath sounds normal. No respiratory distress. He has no wheezes. He has no rales. He exhibits no tenderness.  GI: Soft. Bowel sounds are normal. He exhibits no distension and no mass. There is no tenderness. There is no rebound and no guarding.  Musculoskeletal: He exhibits edema (trace edema in lower extremities). He exhibits no tenderness.  Lymphadenopathy:    He has no cervical adenopathy.  Neurological: He is alert and oriented to person, place, and time. No cranial nerve deficit.  He has memory issues.  Psychiatric: He has a normal mood and affect. His behavior is normal. Judgment and thought content normal.  He reports being a little depressed with all the current events.    Assessment/Plan: Abdominal pain, nausea and vomiting Descending colon mass, extending into the spleen. Recent history of C. difficile colitis. Atrial fibrillation with RVR/on chronic anticoagulation (Eliquis) History of strokes/1 affecting his left leg/1 after back surgery History ruptured disc with surgical repair - limited mobility since that surgery Type 2 diabetes with diabetic neuropathy. History of bilateral carotid endarterectomies Hypertension   Plan: Dr. Redmond Pulling has reviewed the CT scan. At this point he would need a left colon, and splenectomy for adequate surgical resection. He has never had a colonoscopy. He was told he didn't need one after 92. I have discussed this with Dr. [place] Pink. We are recommending cardiology consult and palliative medicine consult;  for goals of care. We will follow with you as this evaluation continues. I have ordered labs for the a.m. and included a CEA and prealbumin.Marland Kitchen  Kaylyne Axton 05/10/2016, 10:09 AM

## 2016-05-10 NOTE — Progress Notes (Signed)
Heparin dosing: please see note from N.Glogovac at 2:30 pm.

## 2016-05-10 NOTE — Care Management Note (Signed)
Case Management Note  Patient Details  Name: Benjamin Hurley MRN: [id number] Date of Birth: 03-21-1925  Subjective/Objective:83 y/o m admitted w/Colon Ca. Hx: afib. From home.Full code.Sx cons/palliative cons-await recc.  Has [facility] banner-will cons.                 Action/Plan:d/c plan home.   Expected Discharge Date:                  Expected Discharge Plan:  Home/Self Care  In-House Referral:     Discharge planning Services  CM Consult  Post Acute Care Choice:    Choice offered to:     DME Arranged:    DME Agency:     HH Arranged:    HH Agency:     Status of Service:  In process, will continue to follow  If discussed at Long Length of Stay Meetings, dates discussed:    Additional Comments:  Dessa Phi, RN 05/10/2016, 10:42 AM

## 2016-05-10 NOTE — Progress Notes (Signed)
ANTICOAGULATION CONSULT NOTE   Pharmacy Consult for Heparin Indication: atrial fibrillation  No Known Allergies  Patient Measurements: Height: 5\' 6"  (167.6 cm) Weight: 145 lb 4.5 oz (65.9 kg) IBW/kg (Calculated) : 63.8 Heparin Dosing Weight: 71.9  Vital Signs: Temp: 97.8 F (36.6 C) (03/23 1401) Temp Source: Oral (03/23 1401) BP: 104/68 (03/23 1401) Pulse Rate: 108 (03/23 1401)  Labs:  Recent Labs  05/09/16 2057 05/09/16 2256 05/10/16 0500 05/10/16 1200  HGB  --   --  12.5*  --   HCT  --   --  37.1*  --   PLT  --   --  293  --   APTT  --  34 91* 83*  HEPARINUNFRC  --   --  >2.20*  --   CREATININE  --   --  1.01 1.07  TROPONINI <0.03  --  <0.03 <0.03    Estimated Creatinine Clearance: 43.1 mL/min (by C-G formula based on SCr of 1.07 mg/dL).  Medical History: Past Medical History:  Diagnosis Date  . A-fib ([place])    on Eliquis  . BPH (benign prostatic hyperplasia)   . Carotid stenosis   . Diabetes ([place])   . Essential hypertension   . Peripheral neuropathy ([facility])    Medications:  Scheduled:  . feeding supplement  1 Container Oral TID BM  . gabapentin  300 mg Oral QHS  . insulin aspart  0-15 Units Subcutaneous TID WC  . insulin glargine  5 Units Subcutaneous QHS  . latanoprost  1 drop Both Eyes QHS  . metoprolol tartrate  25 mg Oral BID  . polyvinyl alcohol  1 drop Both Eyes BID   Infusions:  . dextrose 50 mL/hr at 05/10/16 1406  . diltiazem (CARDIZEM) infusion 5 mg/hr (05/10/16 1406)  . heparin 1,000 Units/hr (05/09/16 2332)   Assessment: 42 yoM admitted with probable colon perforation and mass, in Afib, seen prev at [place]: for elevated HR - tried Diltiazem & Metoprolol, anti-nausea meds. Hx CVA, on Apixaban 5mg  bid, LD 3/21 at 1700  Begin Heparin infusion for Afib, no bolus  aPTT ordered for baseline, use to measure Apixaban effect  Once aPTT and Heparin level correlate can monitor using only Heparin levels  Today, 05/10/16  APTT is 83  Hgb 12.5,  plt 293  No bleeding issues reported  Goal of Therapy:  APTT 66-102 sec Heparin level 0.3-0.7 units/ml Monitor platelets by anticoagulation protocol: Yes   Plan:   Continue Heparin infusion at 1000 units/hr, no bolus  APTT and Heparin level with AM labs    Royetta Asal, PharmD, BCPS Pager [phone] 05/10/2016 2:35 PM

## 2016-05-10 NOTE — Progress Notes (Signed)
PROGRESS NOTE  Benjamin Hurley:[id number] DOB: 1925-03-22 DOA: 05/09/2016 PCP: No PCP Per Patient  HPI/Recap of past 67 hours: 83 year old male past medical history of atrial fibrillation on chronic anticoagulation and diabetes mellitus who said several hospitalizations at the [place] for C. difficile including one 2 weeks ago was evaluated at [facility] for several days of severe nausea and vomiting and very loose stools and reportedly had a chest x-ray that showed questionable free air. Patient then underwent a CT scan which did not note any type of bowel perforation, but did note a mass extending from the descending colon into inferior splenic hilum and with spleen involvement concerning for cancer. Hospitalists were contacted and patient was transferred over to [facility].   This morning, patient is doing okay. Does not really complain of pain, only very loose stools and some nausea, which is a chronic problem for the patient. General surgery consulted as well as cardiology. Stools tested on admission negative for C. difficile.  Assessment/Plan: Principal Problem:  Suspected Colon cancer ([place]) with colonic mass causing partial obstruction extending into spleen: Discussed with general surgery and the patient is to be a surgery candidate, he would require a partial colectomy with splenectomy. CEA level pending. Reviewed albumin and if needed, discussed with nutrition. Cardiology to see for possible surgical clearance. Palliative care consulted Active Problems:   Atrial fibrillation with RVR [facility]): Chads 2 score of 7. Eliquis on hold and patient on IV heparin. Given tachycardia, have started Cardizem drip   Hypomagnesemia: Replacing   Diabetes mellitus, type 2 ([place]): With decreased by mouth, patient had hypoglycemia this morning. Checking A1c. Have decrease Lantus from 20 units down to 5. Have added D5 to IV fluids. Continue sliding scale   Code Status: Full code   Family  Communication: Wife at the bedside   Disposition Plan: Depending on goals of care, cardiac clearance    Consultants: General surgery Cardiology Palliative care  Procedures:  None   Antimicrobials:  None   DVT prophylaxis: IV heparin  Objective: Vitals:   05/09/16 2100 05/09/16 2153 05/10/16 0602 05/10/16 0920  BP:   104/72 94/77  Pulse:  (!) 120  (!) 123  Resp:   18 18  Temp:   98 F (36.7 C) 98.4 F (36.9 C)  TempSrc:   Oral Oral  SpO2:   97% 97%  Weight: 71.9 kg (158 lb 8.2 oz)  65.9 kg (145 lb 4.5 oz)   Height: 5\' 6"  (1.676 m)       Intake/Output Summary (Last 24 hours) at 05/10/16 1252 Last data filed at 05/10/16 0200  Gross per 24 hour  Intake            24.67 ml  Output                0 ml  Net            24.67 ml   Filed Weights   05/09/16 2014 05/09/16 2100 05/10/16 0602  Weight: 71.9 kg (158 lb 8.2 oz) 71.9 kg (158 lb 8.2 oz) 65.9 kg (145 lb 4.5 oz)    Exam:   General:  Alert and oriented 2, no acute distress   Cardiovascular: Irregular rhythm, tachycardic   Respiratory: Clear to Auscultation bilaterally  Abdomen:  soft, mild distention, nontender, hypoactive bowel sounds   Musculoskeletal: no clubbing or cyanosis, trace pitting edema   Skin: no skin breaks, tears or lesions  Psychiatry: patient is appropriate, no evidence  of psychoses    Data Reviewed: CBC:  Recent Labs Lab 05/10/16 0500  WBC 5.6  HGB 12.5*  HCT 37.1*  MCV 90.7  PLT 007   Basic Metabolic Panel:  Recent Labs Lab 05/10/16 0500  NA 134*  K 4.8  CL 104  CO2 22  GLUCOSE 85  BUN 18  CREATININE 1.01  CALCIUM 8.8*  MG 1.9   GFR: Estimated Creatinine Clearance: 45.6 mL/min (by C-G formula based on SCr of 1.01 mg/dL). Liver Function Tests: No results for input(s): AST, ALT, ALKPHOS, BILITOT, PROT, ALBUMIN in the last 168 hours. No results for input(s): LIPASE, AMYLASE in the last 168 hours. No results for input(s): AMMONIA in the last 168  hours. Coagulation Profile: No results for input(s): INR, PROTIME in the last 168 hours. Cardiac Enzymes:  Recent Labs Lab 05/09/16 2057 05/10/16 0500  TROPONINI <0.03 <0.03   BNP (last 3 results) No results for input(s): PROBNP in the last 8760 hours. HbA1C: No results for input(s): HGBA1C in the last 72 hours. CBG:  Recent Labs Lab 05/09/16 2011 05/09/16 2151 05/10/16 0732 05/10/16 1154  GLUCAP 61* 76 79 161*   Lipid Profile: No results for input(s): CHOL, HDL, LDLCALC, TRIG, CHOLHDL, LDLDIRECT in the last 72 hours. Thyroid Function Tests: No results for input(s): TSH, T4TOTAL, FREET4, T3FREE, THYROIDAB in the last 72 hours. Anemia Panel: No results for input(s): VITAMINB12, FOLATE, FERRITIN, TIBC, IRON, RETICCTPCT in the last 72 hours. Urine analysis: No results found for: COLORURINE, APPEARANCEUR, LABSPEC, PHURINE, GLUCOSEU, HGBUR, BILIRUBINUR, KETONESUR, PROTEINUR, UROBILINOGEN, NITRITE, LEUKOCYTESUR Sepsis Labs: @LABRCNTIP (procalcitonin:4,lacticidven:4)  ) Recent Results (from the past 240 hour(s))  C difficile quick scan w PCR reflex     Status: None   Collection Time: 05/10/16  3:07 AM  Result Value Ref Range Status   C Diff antigen NEGATIVE NEGATIVE Final   C Diff toxin NEGATIVE NEGATIVE Final   C Diff interpretation No C. difficile detected.  Final      Studies: No results found.  Scheduled Meds: . feeding supplement  1 Container Oral TID BM  . gabapentin  300 mg Oral QHS  . insulin aspart  0-15 Units Subcutaneous TID WC  . insulin glargine  5 Units Subcutaneous QHS  . latanoprost  1 drop Both Eyes QHS  . metoprolol tartrate  25 mg Oral BID  . polyvinyl alcohol  1 drop Both Eyes BID    Continuous Infusions: . dextrose    . diltiazem (CARDIZEM) infusion    . heparin 1,000 Units/hr (05/09/16 2332)     LOS: 1 day    Annita Brod, MD [facility] Pager [phone]  If 7PM-7AM, please contact  night-coverage [email or web address] Password [facility] 05/10/2016, 12:52 PM

## 2016-05-10 NOTE — Consult Note (Signed)
Consultation Note Date: 05/10/2016   Patient Name: Benjamin Hurley  DOB: [birth year]  MRN: [id number]  Age / Sex: 83 y.o., male  PCP: No Pcp Per Patient Referring Physician: Annita Brod, MD  Reason for Consultation: Establishing goals of care  HPI/Patient Profile: 83 y.o. 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 He reportedly had a chest x-ray that showed questionable free air. Patient then underwent a CT scan which did not note any type of bowel perforation, but did note a mass extending from the descending colon into inferior splenic hilum and with spleen involvement concerning for cancer. Hospitalists were contacted and patient was transferred over to [facility].   Clinical Assessment and Goals of Care:  Patient has since been admitted for suspected colon cancer, colonic mass causing partial obstruction extending into the spleen. He has been seen by surgery, being considered for partial colectomy with splenectomy. A CEA level has been ordered. Additionally, cardiology input is being sought for the patient's A fib.   A palliative consult has been placed for goals of care discussions.   The patient is resting in bed, he is in no distress, a peripheral IV is being placed. Son and wife are at the bedside.  I introduced myself and palliative care as follows: Palliative medicine is specialized medical care for people living with serious illness. It focuses on providing relief from the symptoms and stress of a serious illness. The goal is to improve quality of life for both the patient and the family.  The patient has had nausea for 2 years  now, he is gradually getting weaker, has not been able to eat well at home. Additionally, he developed lots of diarrhea and abdominal pain at home. Patient lives at home with wife and daughter. His son lives in [place], [place].   Patient reportedly has had advanced directives discussions with his PCP in the past, he elects full code status, he has discussed CPR and full scope of resuscitative attempts with his provider, his son states that he is not a DNR.   We discussed about possible surgical options, son is asking if there is evidence of metastases. We discussed about getting all of our information from various specialists to guide appropriate decision making. Offered active listening and supportive care.   HCPOA  daughter.  Has a wife, has 2 sons.   SUMMARY OF RECOMMENDATIONS    full code Family and patient asking about surgical options. Continue current mode of care Palliative will continue to follow  Code Status/Advance Care Planning:  Full code    Symptom Management:     As above  Palliative Prophylaxis:   Bowel Regimen  Additional Recommendations (Limitations, Scope, Preferences):  Full Scope Treatment  Psycho-social/Spiritual:   Desire for further Chaplaincy support:no  Additional Recommendations: Caregiving  Support/Resources  Prognosis:   Unable to determine  Discharge Planning: To Be Determined       Primary Diagnoses: Present on Admission: . Colon cancer ([place]) . Atrial fibrillation with RVR ([place]) . Hypomagnesemia   I have reviewed the medical record, interviewed the patient and family, and examined the patient. The following aspects are pertinent.  Past Medical History:  Diagnosis Date  . A-fib ([place])    on Eliquis  . BPH (benign prostatic hyperplasia)   . Carotid stenosis   . Diabetes ([place])   . Essential hypertension   . Peripheral neuropathy [facility])    Social History   Social History  . Marital status: Married    Spouse name: N/A  .  Number of children: N/A  . Years of education: N/A   Occupational History  . retired    Social History Main Topics  . Smoking status: Former Smoker    Types: Pipe    Quit date: 1968  . Smokeless tobacco: Former Systems developer    Types: Chew    Quit date: 1968  . Alcohol use No  . Drug use: No  . Sexual activity: Not Asked   Other Topics Concern  . None   Social History Narrative  . None   History reviewed. No pertinent family history. Scheduled Meds: . feeding supplement  1 Container Oral TID BM  . gabapentin  300 mg Oral QHS  . insulin aspart  0-15 Units Subcutaneous TID WC  . insulin glargine  5 Units Subcutaneous QHS  . latanoprost  1 drop Both Eyes QHS  . metoprolol tartrate  25 mg Oral BID  . polyvinyl alcohol  1 drop Both Eyes BID   Continuous Infusions: . dextrose    . diltiazem (CARDIZEM) infusion    . heparin 1,000 Units/hr (05/09/16 2332)   PRN Meds:.acetaminophen, morphine injection, ondansetron (ZOFRAN) IV Medications Prior to Admission:  Prior to Admission medications   Medication Sig Start Date End Date Taking? Authorizing Provider  apixaban (ELIQUIS) 5 MG TABS tablet Take 5 mg by mouth 2 (two) times daily.   Yes Historical Provider, MD  carboxymethylcellulose (REFRESH PLUS) 0.5 % SOLN Place 1 drop into both eyes 2 (two) times daily.   Yes Historical Provider, MD  diltiazem (DILACOR XR) 120 MG 24 hr capsule Take 120 mg by mouth daily.   Yes Historical Provider, MD  feeding supplement, GLUCERNA SHAKE, (GLUCERNA SHAKE) LIQD Take 237 mLs by mouth daily.   Yes Historical Provider, MD  gabapentin (NEURONTIN) 300 MG capsule Take 300 mg by mouth at bedtime. 03/13/16  Yes Historical Provider, MD  insulin glargine (LANTUS) 100 UNIT/ML injection Inject 15-20 Units into the skin at bedtime. Patients wife states that she does a three day cycle. The first two days she gives the patient 20 units. Then on the third night she checks blood sugar. If blood sugar is under 100 she gives  patient 15 units but if it is over 100 she gives the patient 20 units.   Yes Historical Provider, MD  latanoprost (XALATAN) 0.005 % ophthalmic solution Place 1 drop into both eyes at bedtime.   Yes Historical Provider, MD  metFORMIN (GLUCOPHAGE) 500 MG tablet Take 500 mg by mouth 2 (two) times daily with a meal.   Yes Historical Provider, MD  pantoprazole (PROTONIX) 40 MG tablet Take 40 mg by mouth daily.   Yes Historical Provider, MD  promethazine (PHENERGAN) 25 MG tablet Take 12.5 mg by mouth every 4 (four)  hours as needed for nausea or vomiting.   Yes Historical Provider, MD  simethicone (MYLICON) 80 MG chewable tablet Chew 80 mg by mouth every 6 (six) hours as needed for flatulence.   Yes Historical Provider, MD  sucralfate (CARAFATE) 1 GM/10ML suspension Take 1 g by mouth 2 (two) times daily.   Yes Historical Provider, MD   No Known Allergies Review of Systems +nausea   Physical Exam   General:  Alert and oriented 2, no acute distress   Cardiovascular: Irregular rhythm, tachycardic   Respiratory: Clear to Auscultation bilaterally  Abdomen:  soft, mild distention, nontender, hypoactive bowel sounds   Musculoskeletal: no clubbing or cyanosis, trace pitting edema   Skin: no skin breaks, tears or lesions  Psychiatry: patient is appropriate, no evidence of psychoses  Vital Signs: BP 94/77 (BP Location: Right Arm)   Pulse (!) 123   Temp 98.4 F (36.9 C) (Oral)   Resp 18   Ht 5\' 6"  (1.676 m)   Wt 65.9 kg (145 lb 4.5 oz)   SpO2 97%   BMI 23.45 kg/m  Pain Assessment: 0-10   Pain Score: 7    SpO2: SpO2: 97 % O2 Device:SpO2: 97 % O2 Flow Rate: .   IO: Intake/output summary:  Intake/Output Summary (Last 24 hours) at 05/10/16 1331 Last data filed at 05/10/16 0200  Gross per 24 hour  Intake            24.67 ml  Output                0 ml  Net            24.67 ml    LBM: Last BM Date: 05/09/16 Baseline Weight: Weight: 71.9 kg (158 lb 8.2 oz) Most recent weight:  Weight: 65.9 kg (145 lb 4.5 oz)     Palliative Assessment/Data:   Flowsheet Rows     Most Recent Value  Intake Tab  Referral Department  Hospitalist  Unit at Time of Referral  Oncology Unit  Palliative Care Primary Diagnosis  Cancer  Date Notified  05/09/16  Palliative Care Type  New Palliative care  Reason for referral  Clarify Goals of Care  Date of Admission  05/09/16  Date first seen by Palliative Care  05/10/16  # of days IP prior to Palliative referral  0  Clinical Assessment  Palliative Performance Scale Score  30%  Pain Max last 24 hours  4  Pain Min Last 24 hours  3  Dyspnea Max Last 24 Hours  4  Dyspnea Min Last 24 hours  3  Nausea Max Last 24 Hours  6  Nausea Min Last 24 Hours  5  Psychosocial & Spiritual Assessment  Palliative Care Outcomes  Patient/Family meeting held?  Yes  Who was at the meeting?  patient son wife   Palliative Care Outcomes  Clarified goals of care      Time In:  37 Time Out:  1300 Time Total:  60  Greater than 50%  of this time was spent counseling and coordinating care related to the above assessment and plan.  Signed by: Loistine Chance, MD  [phone]  Please contact Palliative Medicine Team phone at [phone] for questions and concerns.  For individual provider: See Shea Evans

## 2016-05-10 NOTE — Consult Note (Signed)
   [facility] Inpatient Consult   05/10/2016  Benjamin Hurley August 29, 1925 [id number]     [facility] Care Management referral received. Chart reviewed. Palliative Medicine consult pending. Will follow up at later time if appropriate. Discussed above with inpatient above inpatient RNCM.   Marthenia Rolling, MSN-Ed, RN,BSN [facility] Liaison [phone]

## 2016-05-10 NOTE — Progress Notes (Signed)
Nutrition Brief Note  Patient seen and discussed during rounds this AM.  Wt Readings from Last 15 Encounters:  05/10/16 145 lb 4.5 oz (65.9 kg)    Body mass index is 23.45 kg/m. Patient meets criteria for normal weight based on current BMI. Skin WDL.   Current diet order is CLD and for breakfast this AM pt had 100% of broth and cranberry juice. He denies abdominal pain but reports some nausea. His wife, who is at bedside, reports that he has chronic nausea which has been ongoing x2 years. Per Dr. Lorin Mercy' note from last night: transfer from [facility] today and he had CXR, CT.  They told him it was perforation and then came back to report free air and a "big tumor" in his colon, that it was colon cancer. 10 pounds weight GAIN over 2 weeks.  Wife reports he is not eating hardly anything.  Labs and medications reviewed. Will order Boost Breeze TID, each supplement provides 250 kcal and 9 grams of protein; CLD provides very minimal protein. No further nutrition interventions warranted at this time but RD will continue to monitor for recommendations per Surgery and for Palliative Care note. If nutrition issues arise, please consult RD.     Benjamin Matin, MS, RD, LDN, [facility] Inpatient Clinical Dietitian Pager # [phone] After hours/weekend pager # [phone]

## 2016-05-10 NOTE — Plan of Care (Signed)
Problem: Pain Managment: Goal: General experience of comfort will improve Pain 9/10 aching to head. Tylenol requested and given. Adequate relief.    Problem: Nutrition: Goal: Adequate nutrition will be maintained Outcome: Not Progressing On clear liquid diet.  Poor appetite, poor intake. Blood sugar 79 this AM. D5 started at 50 ml/hr. Will continue to monitor.

## 2016-05-10 NOTE — Progress Notes (Signed)
ANTICOAGULATION CONSULT NOTE   Pharmacy Consult for Heparin Indication: atrial fibrillation  No Known Allergies  Patient Measurements: Height: 5\' 6"  (167.6 cm) Weight: 145 lb 4.5 oz (65.9 kg) IBW/kg (Calculated) : 63.8 Heparin Dosing Weight: 71.9  Vital Signs: Temp: 98 F (36.7 C) (03/23 0602) Temp Source: Oral (03/23 0602) BP: 104/72 (03/23 0602) Pulse Rate: 120 (03/22 2153)  Labs:  Recent Labs  05/09/16 2057 05/09/16 2256 05/10/16 0500  HGB  --   --  12.5*  HCT  --   --  37.1*  PLT  --   --  293  APTT  --  34 91*  CREATININE  --   --  1.01  TROPONINI <0.03  --  <0.03    Estimated Creatinine Clearance: 45.6 mL/min (by C-G formula based on SCr of 1.01 mg/dL).  Medical History: Past Medical History:  Diagnosis Date  . A-fib ([place])    on Eliquis  . BPH (benign prostatic hyperplasia)   . Carotid stenosis   . Diabetes ([place]   . Essential hypertension   . Peripheral neuropathy ([facility])    Medications:  Scheduled:  . diltiazem  120 mg Oral Daily  . gabapentin  300 mg Oral QHS  . insulin aspart  0-15 Units Subcutaneous TID WC  . insulin glargine  20 Units Subcutaneous QHS  . latanoprost  1 drop Both Eyes QHS  . metoprolol tartrate  25 mg Oral BID  . polyvinyl alcohol  1 drop Both Eyes BID   Infusions:  . heparin 1,000 Units/hr (05/09/16 2332)   Assessment: [name] admitted with probable colon perforation and mass, in Afib, seen prev at [place]: for elevated HR - tried Diltiazem & Metoprolol, anti-nausea meds. Hx CVA, on Apixaban 5mg  bid, LD 3/21 at 1700  Begin Heparin infusion for Afib, no bolus  aPTT ordered for baseline, use to measure Apixaban effect  Once aPTT and Heparin level correlate can monitor using only Heparin levels  Today, 3/23  0500 aPtt=91 and HL=  >2.20>aPtt at goal (HL affected by recent apixaban use), no infusion or bleeding issues per RN  Goal of Therapy:  APTT 66-102 sec Heparin level 0.3-0.7 units/ml Monitor platelets by  anticoagulation protocol: Yes   Plan:   Continue Heparin infusion at 1000 units/hr  Recheck aPtt at noon today   Benjamin Hurley 05/10/2016, 6:09 AM

## 2016-05-11 LAB — CBC
HCT: 32.2 % — ABNORMAL LOW (ref 39.0–52.0)
HEMOGLOBIN: 10.9 g/dL — AB (ref 13.0–17.0)
MCH: 30.8 pg (ref 26.0–34.0)
MCHC: 33.9 g/dL (ref 30.0–36.0)
MCV: 91 fL (ref 78.0–100.0)
PLATELETS: 210 10*3/uL (ref 150–400)
RBC: 3.54 MIL/uL — ABNORMAL LOW (ref 4.22–5.81)
RDW: 14.5 % (ref 11.5–15.5)
WBC: 4.5 10*3/uL (ref 4.0–10.5)

## 2016-05-11 LAB — GLUCOSE, CAPILLARY
GLUCOSE-CAPILLARY: 119 mg/dL — AB (ref 65–99)
GLUCOSE-CAPILLARY: 161 mg/dL — AB (ref 65–99)
GLUCOSE-CAPILLARY: 110 mg/dL — AB (ref 65–99)
Glucose-Capillary: 267 mg/dL — ABNORMAL HIGH (ref 65–99)

## 2016-05-11 LAB — HEPARIN LEVEL (UNFRACTIONATED): HEPARIN UNFRACTIONATED: 2.14 [IU]/mL — AB (ref 0.30–0.70)

## 2016-05-11 LAB — APTT
aPTT: 158 seconds — ABNORMAL HIGH (ref 24–36)
aPTT: 66 seconds — ABNORMAL HIGH (ref 24–36)

## 2016-05-11 LAB — TSH: TSH: 1.509 u[IU]/mL (ref 0.350–4.500)

## 2016-05-11 LAB — PREALBUMIN: Prealbumin: 9.8 mg/dL — ABNORMAL LOW (ref 18–38)

## 2016-05-11 MED ORDER — INSULIN GLARGINE 100 UNIT/ML ~~LOC~~ SOLN
7.0000 [IU] | Freq: Every day | SUBCUTANEOUS | Status: DC
  Administered 2016-05-11 – 2016-05-16 (×6): 7 [IU] via SUBCUTANEOUS
  Filled 2016-05-11 (×7): qty 0.07

## 2016-05-11 MED ORDER — HEPARIN (PORCINE) IN NACL 100-0.45 UNIT/ML-% IJ SOLN: 800.0000 [IU]/h | INTRAMUSCULAR | Status: DC

## 2016-05-11 MED ORDER — POLYETHYLENE GLYCOL 3350 17 G PO PACK
17.0000 g | PACK | Freq: Three times a day (TID) | ORAL | Status: AC
  Administered 2016-05-11 – 2016-05-12 (×2): 17 g via ORAL
  Filled 2016-05-11: qty 1

## 2016-05-11 MED ORDER — HEPARIN (PORCINE) IN NACL 100-0.45 UNIT/ML-% IJ SOLN
850.0000 [IU]/h | INTRAMUSCULAR | Status: DC
  Administered 2016-05-11: 850 [IU]/h via INTRAVENOUS
  Filled 2016-05-11: qty 250

## 2016-05-11 NOTE — Consult Note (Signed)
Primary cardiologist: [facility] cardiology Consulting cardiologist: Dr Carlyle Dolly Requesting physician: Dr Maryland Pink Indication: afib  Clinical Summary Mr. Santoli is a 83 y.o.male history of DM2, HTN, carotid stenosis, afib on eliquis admitted with abdominal pain and new diagnosis of colon cancer. Appears his metoprolol was changed to diltiazem recently due to nausea suspected to be medication related, since then has had elevated heart rates. Appears he has had recent admits for cdiff and other medical issues, high heart rates during admission and CCB and BB titrated. Currently on dilt gtt and oral lopressor 25mg  bid.     K 4.8 Cr 1, Hgb 12.5 Plt 293 Mg 1.9  Trop neg x 2 EKG afib  No Known Allergies  Medications Scheduled Medications: . feeding supplement  1 Container Oral TID BM  . feeding supplement (GLUCERNA SHAKE)  237 mL Oral TID BM  . gabapentin  300 mg Oral QHS  . insulin aspart  0-15 Units Subcutaneous TID WC  . insulin glargine  5 Units Subcutaneous QHS  . latanoprost  1 drop Both Eyes QHS  . metoprolol tartrate  25 mg Oral BID  . polyvinyl alcohol  1 drop Both Eyes BID     Infusions: . dextrose 50 mL/hr at 05/11/16 0625  . diltiazem (CARDIZEM) infusion 5 mg/hr (05/11/16 0625)  . heparin       PRN Medications:  acetaminophen, morphine injection, ondansetron (ZOFRAN) IV   Past Medical History:  Diagnosis Date  . A-fib ([place])    on Eliquis  . BPH (benign prostatic hyperplasia)   . Carotid stenosis   . Diabetes ([place])   . Essential hypertension   . Peripheral neuropathy [facility])     Past Surgical History:  Procedure Laterality Date  . CAROTID ENDARTERECTOMY Bilateral   . lumber spine    . TRANSURETHRAL RESECTION OF PROSTATE      History reviewed. No pertinent family history.  Social History Mr. Inabinet reports that he quit smoking about 50 years ago. His smoking use included Pipe. He quit smokeless tobacco use about 50 years ago. His smokeless  tobacco use included Chew. Mr. Rewerts reports that he does not drink alcohol.  Review of Systems CONSTITUTIONAL: No weight loss, fever, chills, weakness or fatigue.  HEENT: Eyes: No visual loss, blurred vision, double vision or yellow sclerae. No hearing loss, sneezing, congestion, runny nose or sore throat.  SKIN: No rash or itching.  CARDIOVASCULAR: No chest pain, chest pressure or chest discomfort. No palpitations or edema.  RESPIRATORY: No shortness of breath, cough or sputum.  GASTROINTESTINAL: No anorexia, nausea, vomiting or diarrhea. No abdominal pain or blood.  GENITOURINARY: no polyuria, no dysuria NEUROLOGICAL: No headache, dizziness, syncope, paralysis, ataxia, numbness or tingling in the extremities. No change in bowel or bladder control.  MUSCULOSKELETAL: No muscle, back pain, joint pain or stiffness.  HEMATOLOGIC: No anemia, bleeding or bruising.  LYMPHATICS: No enlarged nodes. No history of splenectomy.  PSYCHIATRIC: No history of depression or anxiety.      Physical Examination Blood pressure (!) 108/52, pulse 76, temperature 97.7 F (36.5 C), temperature source Oral, resp. rate 18, height 5\' 6"  (1.676 m), weight 154 lb 12.2 oz (70.2 kg), SpO2 99 %.  Intake/Output Summary (Last 24 hours) at 05/11/16 0910 Last data filed at 05/11/16 0600  Gross per 24 hour  Intake           1214.5 ml  Output                0  ml  Net           1214.5 ml    HEENT: sclera clear, throat clear  Cardiovascular: irreg, no m/r/g, no jvd  Respiratory: CTAB  GI: abdomen soft, NT, ND  MSK: no LE edema  Neuro: no focal deficits  Psych: appropriate affect   Lab Results  Basic Metabolic Panel:  Recent Labs Lab 05/10/16 0500 05/10/16 1200  NA 134* 130*  K 4.8 4.5  CL 104 101  CO2 22 21*  GLUCOSE 85 226*  BUN 18 18  CREATININE 1.01 1.07  CALCIUM 8.8* 8.5*  MG 1.9  --     Liver Function Tests:  Recent Labs Lab 05/10/16 1200  AST 31  ALT 14*  ALKPHOS 68    BILITOT 0.7  PROT 6.8  ALBUMIN 3.4*    CBC:  Recent Labs Lab 05/10/16 0500 05/11/16 0433  WBC 5.6 4.5  HGB 12.5* 10.9*  HCT 37.1* 32.2*  MCV 90.7 91.0  PLT 293 210    Cardiac Enzymes:  Recent Labs Lab 05/09/16 2057 05/10/16 0500 05/10/16 1200  TROPONINI <0.03 <0.03 <0.03    BNP: Invalid input(s): POCBNP      Impression/Recommendations 1. Afib - on dilt gtt at 5, lopressor 25mg  bid. Rates controlled this AM. Reasonable bp's.  - given his intermittent nausea and vomiting, possible need to be NPO for procedures, we will continue IV dilt today - transition to oral once N/V has resovled and no further invasive testing required - CHADS2Vasc score is 7, eliquis on hold on heparin drip in anticipation of invasive workup for abdominal mass.   2. Abdominal mass - suspected cancer, workup per primary team and oncology     Carlyle Dolly, M.D.

## 2016-05-11 NOTE — Progress Notes (Signed)
ANTICOAGULATION CONSULT NOTE   Pharmacy Consult for Heparin Indication: atrial fibrillation  No Known Allergies  Patient Measurements: Height: 5\' 6"  (167.6 cm) Weight: 154 lb 12.2 oz (70.2 kg) IBW/kg (Calculated) : 63.8 Heparin Dosing Weight: 71.9  Vital Signs: Temp: 97.7 F (36.5 C) (03/23 2017) Temp Source: Oral (03/23 2017) BP: 108/52 (03/24 0543) Pulse Rate: 76 (03/24 0543)  Labs:  Recent Labs  05/09/16 2057  05/10/16 0500 05/10/16 1200 05/11/16 0433  HGB  --   --  12.5*  --  10.9*  HCT  --   --  37.1*  --  32.2*  PLT  --   --  293  --  210  APTT  --   < > 91* 83* 158*  HEPARINUNFRC  --   --  >2.20*  --  2.14*  CREATININE  --   --  1.01 1.07  --   TROPONINI <0.03  --  <0.03 <0.03  --   < > = values in this interval not displayed.  Estimated Creatinine Clearance: 43.1 mL/min (by C-G formula based on SCr of 1.07 mg/dL).  Medical History: Past Medical History:  Diagnosis Date  . A-fib ([place])    on Eliquis  . BPH (benign prostatic hyperplasia)   . Carotid stenosis   . Diabetes ([place])   . Essential hypertension   . Peripheral neuropathy ([facility])    Medications:  Scheduled:  . feeding supplement  1 Container Oral TID BM  . feeding supplement (GLUCERNA SHAKE)  237 mL Oral TID BM  . gabapentin  300 mg Oral QHS  . insulin aspart  0-15 Units Subcutaneous TID WC  . insulin glargine  5 Units Subcutaneous QHS  . latanoprost  1 drop Both Eyes QHS  . metoprolol tartrate  25 mg Oral BID  . polyvinyl alcohol  1 drop Both Eyes BID   Infusions:  . dextrose 50 mL/hr at 05/11/16 0625  . diltiazem (CARDIZEM) infusion 5 mg/hr (05/11/16 0625)  . heparin 1,000 Units/hr (05/10/16 2146)   Assessment: 41 yoM admitted with probable colon perforation and mass, in Afib, seen prev at [place]: for elevated HR - tried Diltiazem & Metoprolol, anti-nausea meds. Hx CVA, on Apixaban 5mg  bid, LD 3/21 at 1700  Begin Heparin infusion for Afib, no bolus  aPTT ordered for baseline, use to  measure Apixaban effect  Once aPTT and Heparin level correlate can monitor using only Heparin levels  Today, 05/11/16  APTT is 158, supratherapeutic  Hgb 12.5, plt 293  No bleeding issues reported  Goal of Therapy:  APTT 66-102 sec Heparin level 0.3-0.7 units/ml Monitor platelets by anticoagulation protocol: Yes   Plan:   Hold heparin infusion for one hour  After one hour restart heparin at 800 units/hr  APTT and Heparin level 8 hours after restart   Royetta Asal, PharmD, BCPS Pager [phone] 05/11/2016 8:15 AM

## 2016-05-11 NOTE — Progress Notes (Signed)
ANTICOAGULATION CONSULT NOTE   Pharmacy Consult for Heparin Indication: atrial fibrillation  No Known Allergies  Patient Measurements: Height: 5\' 6"  (167.6 cm) Weight: 154 lb 12.2 oz (70.2 kg) IBW/kg (Calculated) : 63.8 Heparin Dosing Weight: 71.9  Vital Signs: Temp Source: Oral (03/24 1206) BP: 124/68 (03/24 1206) Pulse Rate: 77 (03/24 1206)  Labs:  Recent Labs  05/09/16 2057  05/10/16 0500 05/10/16 1200 05/11/16 0433 05/11/16 1839  HGB  --   --  12.5*  --  10.9*  --   HCT  --   --  37.1*  --  32.2*  --   PLT  --   --  293  --  210  --   APTT  --   < > 91* 83* 158* 66*  HEPARINUNFRC  --   --  >2.20*  --  2.14*  --   CREATININE  --   --  1.01 1.07  --   --   TROPONINI <0.03  --  <0.03 <0.03  --   --   < > = values in this interval not displayed.  Estimated Creatinine Clearance: 43.1 mL/min (by C-G formula based on SCr of 1.07 mg/dL).  Medical History: Past Medical History:  Diagnosis Date  . A-fib ([place])    on Eliquis  . BPH (benign prostatic hyperplasia)   . Carotid stenosis   . Diabetes ([place])   . Essential hypertension   . Peripheral neuropathy ([facility])    Medications:  Scheduled:  . feeding supplement  1 Container Oral TID BM  . feeding supplement (GLUCERNA SHAKE)  237 mL Oral TID BM  . gabapentin  300 mg Oral QHS  . insulin aspart  0-15 Units Subcutaneous TID WC  . insulin glargine  7 Units Subcutaneous QHS  . latanoprost  1 drop Both Eyes QHS  . metoprolol tartrate  25 mg Oral BID  . polyethylene glycol  17 g Oral TID  . polyvinyl alcohol  1 drop Both Eyes BID   Infusions:  . dextrose 50 mL/hr at 05/11/16 0625  . diltiazem (CARDIZEM) infusion 5 mg/hr (05/11/16 0625)  . heparin 800 Units/hr (05/11/16 1015)   Assessment: 43 yoM admitted with probable colon perforation and mass, in Afib, seen prev at [place]: for elevated HR - tried Diltiazem & Metoprolol, anti-nausea meds. Hx CVA, on Apixaban 5mg  bid, LD 3/21 at 1700  Begin Heparin infusion for Afib, no  bolus  aPTT ordered for baseline, use to measure Apixaban effect  Once aPTT and Heparin level correlate can monitor using only Heparin levels  Today, 05/11/16  AM labs: APTT is 158, supratherapeutic. HL still elevated due to apixaban  1800 level is 66  Hgb 12.5, plt 293  No bleeding issues reported  Goal of Therapy:  APTT 66-102 sec Heparin level 0.3-0.7 units/ml Monitor platelets by anticoagulation protocol: Yes   Plan:   Increase heparin to 850 units/hr  APTT and Heparin level with AM labs   Royetta Asal, PharmD, BCPS Pager [phone] 05/11/2016 7:45 PM

## 2016-05-11 NOTE — Progress Notes (Addendum)
PROGRESS NOTE  Benjamin Hurley TML:[id number] DOB: 01-14-1926 DOA: 05/09/2016 PCP: No PCP Per Patient  HPI/Recap of past 58 hours: 83 year old male past medical history of atrial fibrillation on chronic anticoagulation and diabetes mellitus who said several hospitalizations at the [place] for C. difficile including one 2 weeks ago was evaluated at [facility] for several days of severe nausea and vomiting and very loose stools and reportedly had a chest x-ray that showed questionable free air. Patient then underwent a CT scan which did not note any type of bowel perforation, but did note a mass extending from the descending colon into inferior splenic hilum and with spleen involvement concerning for cancer. Hospitalists were contacted and patient was transferred over to [facility] long hospital.   Stools tested on admission negative for C. difficile.  Seen by general surgery and cardiology. GI consult pending although a plan for potential flexible sigmoidoscopy on Monday 3/26. Patient self doing well with only minimal pain and left upper quadrant  Assessment/Plan: Principal Problem:  Suspected Colon cancer ([place]) with colonic mass causing partial obstruction extending into spleen: Discussed with general surgery and the patient is to be a surgery candidate, he would require a partial colectomy with splenectomy. CEA level pending. Nutritional supplementation with boost breeze, patient is not protein calorie malnourished. Cardiology to see for possible surgical clearance. Seen by palliative care and patient would like everything done. GI to see, potential flexible sigmoidoscopy on Monday. As per their instructions, have started some MiraLAX for cleaning out prior to scope. Active Problems:  Chronic Atrial fibrillation with [facility]): Chads 2 score of 7. Eliquis on hold and patient on IV heparin. Tolerating Cardizem drip. Heart rate under control   Hypomagnesemia: Replacing   Diabetes mellitus, type 2  ([place]): With decreased by mouth, patient had hypoglycemia this morning. Checking A1c. Have decrease Lantus from 20 units down to 7. Have added D5 to IV fluids. Continue sliding scale   Code Status: Full code   Family Communication: Wife and daughter at the bedside   Disposition Plan: Depending on outcome of flex sig and potential surgery   Consultants: General surgery Cardiology Palliative care Gastroenterology   Procedures:  None   Antimicrobials:  None   DVT prophylaxis: IV heparin  Objective: Vitals:   05/11/16 0543 05/11/16 0625 05/11/16 1015 05/11/16 1206  BP: (!) 108/52  105/68 124/68  Pulse: 76  76 77  Resp: 18   19  Temp:      TempSrc:    Oral  SpO2: 99%   100%  Weight:  70.2 kg (154 lb 12.2 oz)    Height:        Intake/Output Summary (Last 24 hours) at 05/11/16 1342 Last data filed at 05/11/16 0600  Gross per 24 hour  Intake           1214.5 ml  Output                0 ml  Net           1214.5 ml   Filed Weights   05/09/16 2100 05/10/16 0602 05/11/16 0625  Weight: 71.9 kg (158 lb 8.2 oz) 65.9 kg (145 lb 4.5 oz) 70.2 kg (154 lb 12.2 oz)    Exam:   General:  Alert and oriented 2, no acute distress   Cardiovascular: Irregular rhythm, Rate controlled   Respiratory: Clear to Auscultation bilaterally  Abdomen:  soft, mild distention, tender in left upper quadrant, hypoactive bowel sounds  Musculoskeletal: no clubbing or cyanosis, trace pitting edema   Skin: no skin breaks, tears or lesions  Psychiatry: patient is appropriate, no evidence of psychoses    Data Reviewed: CBC:  Recent Labs Lab 05/10/16 0500 05/11/16 0433  WBC 5.6 4.5  HGB 12.5* 10.9*  HCT 37.1* 32.2*  MCV 90.7 91.0  PLT 293 474   Basic Metabolic Panel:  Recent Labs Lab 05/10/16 0500 05/10/16 1200  NA 134* 130*  K 4.8 4.5  CL 104 101  CO2 22 21*  GLUCOSE 85 226*  BUN 18 18  CREATININE 1.01 1.07  CALCIUM 8.8* 8.5*  MG 1.9  --    GFR: Estimated  Creatinine Clearance: 43.1 mL/min (by C-G formula based on SCr of 1.07 mg/dL). Liver Function Tests:  Recent Labs Lab 05/10/16 1200  AST 31  ALT 14*  ALKPHOS 68  BILITOT 0.7  PROT 6.8  ALBUMIN 3.4*   No results for input(s): LIPASE, AMYLASE in the last 168 hours. No results for input(s): AMMONIA in the last 168 hours. Coagulation Profile: No results for input(s): INR, PROTIME in the last 168 hours. Cardiac Enzymes:  Recent Labs Lab 05/09/16 2057 05/10/16 0500 05/10/16 1200  TROPONINI <0.03 <0.03 <0.03   BNP (last 3 results) No results for input(s): PROBNP in the last 8760 hours. HbA1C: No results for input(s): HGBA1C in the last 72 hours. CBG:  Recent Labs Lab 05/10/16 1154 05/10/16 1812 05/10/16 2142 05/11/16 0748 05/11/16 1202  GLUCAP 161* 243* 70 119* 161*   Lipid Profile: No results for input(s): CHOL, HDL, LDLCALC, TRIG, CHOLHDL, LDLDIRECT in the last 72 hours. Thyroid Function Tests:  Recent Labs  05/11/16 0433  TSH 1.509   Anemia Panel: No results for input(s): VITAMINB12, FOLATE, FERRITIN, TIBC, IRON, RETICCTPCT in the last 72 hours. Urine analysis: No results found for: COLORURINE, APPEARANCEUR, LABSPEC, PHURINE, GLUCOSEU, HGBUR, BILIRUBINUR, KETONESUR, PROTEINUR, UROBILINOGEN, NITRITE, LEUKOCYTESUR Sepsis Labs: @LABRCNTIP (procalcitonin:4,lacticidven:4)  ) Recent Results (from the past 240 hour(s))  Gastrointestinal Panel by PCR , Stool     Status: None   Collection Time: 05/10/16  3:07 AM  Result Value Ref Range Status   Campylobacter species NOT DETECTED NOT DETECTED Final   Plesimonas shigelloides NOT DETECTED NOT DETECTED Final   Salmonella species NOT DETECTED NOT DETECTED Final   Yersinia enterocolitica NOT DETECTED NOT DETECTED Final   Vibrio species NOT DETECTED NOT DETECTED Final   Vibrio cholerae NOT DETECTED NOT DETECTED Final   Enteroaggregative E coli (EAEC) NOT DETECTED NOT DETECTED Final   Enteropathogenic E coli (EPEC) NOT  DETECTED NOT DETECTED Final   Enterotoxigenic E coli (ETEC) NOT DETECTED NOT DETECTED Final   Shiga like toxin producing E coli (STEC) NOT DETECTED NOT DETECTED Final   Shigella/Enteroinvasive E coli (EIEC) NOT DETECTED NOT DETECTED Final   Cryptosporidium NOT DETECTED NOT DETECTED Final   Cyclospora cayetanensis NOT DETECTED NOT DETECTED Final   Entamoeba histolytica NOT DETECTED NOT DETECTED Final   Giardia lamblia NOT DETECTED NOT DETECTED Final   Adenovirus F40/41 NOT DETECTED NOT DETECTED Final   Astrovirus NOT DETECTED NOT DETECTED Final   Norovirus GI/GII NOT DETECTED NOT DETECTED Final   Rotavirus A NOT DETECTED NOT DETECTED Final   Sapovirus (I, II, IV, and V) NOT DETECTED NOT DETECTED Final  C difficile quick scan w PCR reflex     Status: None   Collection Time: 05/10/16  3:07 AM  Result Value Ref Range Status   C Diff antigen NEGATIVE NEGATIVE Final   C  Diff toxin NEGATIVE NEGATIVE Final   C Diff interpretation No C. difficile detected.  Final      Studies: No results found.  Scheduled Meds: . feeding supplement  1 Container Oral TID BM  . feeding supplement (GLUCERNA SHAKE)  237 mL Oral TID BM  . gabapentin  300 mg Oral QHS  . insulin aspart  0-15 Units Subcutaneous TID WC  . insulin glargine  5 Units Subcutaneous QHS  . latanoprost  1 drop Both Eyes QHS  . metoprolol tartrate  25 mg Oral BID  . polyethylene glycol  17 g Oral TID  . polyvinyl alcohol  1 drop Both Eyes BID    Continuous Infusions: . dextrose 50 mL/hr at 05/11/16 0625  . diltiazem (CARDIZEM) infusion 5 mg/hr (05/11/16 0625)  . heparin 800 Units/hr (05/11/16 1015)     LOS: 2 days    Annita Brod, MD [facility] Pager [phone]  If 7PM-7AM, please contact night-coverage [email or web address] Password [email or web address] 05/11/2016, 1:42 PM

## 2016-05-11 NOTE — Plan of Care (Signed)
Problem: Nutrition: Goal: Adequate nutrition will be maintained Outcome: Not Progressing Pt has Glucerna unopened still at bedside, refusing breeze.   Will continue to encourage and remind family of importance of supplements.

## 2016-05-11 NOTE — Progress Notes (Signed)
Patient ID: Benjamin Hurley, male   DOB: 12/23/1925, 83 y.o.   MRN: [id number]  [facility]  Chart reviewed.  Dr. Maryland Pink in room with patient.  Family present.  Brief "social" visit today.  Await GI and cardiology evaluations.  Will follow with you.  Earnstine Regal, MD, [facility] Office: [phone]

## 2016-05-12 ENCOUNTER — Inpatient Hospital Stay (HOSPITAL_COMMUNITY): Payer: PPO

## 2016-05-12 DIAGNOSIS — R1012 Left upper quadrant pain: Secondary | ICD-10-CM

## 2016-05-12 DIAGNOSIS — R112 Nausea with vomiting, unspecified: Secondary | ICD-10-CM

## 2016-05-12 DIAGNOSIS — R935 Abnormal findings on diagnostic imaging of other abdominal regions, including retroperitoneum: Secondary | ICD-10-CM

## 2016-05-12 DIAGNOSIS — I482 Chronic atrial fibrillation: Secondary | ICD-10-CM

## 2016-05-12 LAB — GLUCOSE, CAPILLARY
GLUCOSE-CAPILLARY: 154 mg/dL — AB (ref 65–99)
GLUCOSE-CAPILLARY: 315 mg/dL — AB (ref 65–99)
GLUCOSE-CAPILLARY: 80 mg/dL (ref 65–99)
GLUCOSE-CAPILLARY: 187 mg/dL — AB (ref 65–99)
Glucose-Capillary: 172 mg/dL — ABNORMAL HIGH (ref 65–99)

## 2016-05-12 LAB — CEA: CEA: 23.2 ng/mL — ABNORMAL HIGH (ref 0.0–4.7)

## 2016-05-12 LAB — APTT
aPTT: 83 seconds — ABNORMAL HIGH (ref 24–36)
aPTT: 65 seconds — ABNORMAL HIGH (ref 24–36)

## 2016-05-12 LAB — CBC
HCT: 31.9 % — ABNORMAL LOW (ref 39.0–52.0)
Hemoglobin: 10.9 g/dL — ABNORMAL LOW (ref 13.0–17.0)
MCH: 31 pg (ref 26.0–34.0)
MCHC: 34.2 g/dL (ref 30.0–36.0)
MCV: 90.6 fL (ref 78.0–100.0)
PLATELETS: 214 10*3/uL (ref 150–400)
RBC: 3.52 MIL/uL — ABNORMAL LOW (ref 4.22–5.81)
RDW: 14.5 % (ref 11.5–15.5)
WBC: 6.4 10*3/uL (ref 4.0–10.5)

## 2016-05-12 LAB — HEPARIN LEVEL (UNFRACTIONATED): Heparin Unfractionated: 1.22 IU/mL — ABNORMAL HIGH (ref 0.30–0.70)

## 2016-05-12 MED ORDER — ALUM & MAG HYDROXIDE-SIMETH 200-200-20 MG/5ML PO SUSP
30.0000 mL | Freq: Four times a day (QID) | ORAL | Status: DC | PRN
Start: 2016-05-12 — End: 2016-05-17
  Administered 2016-05-12 – 2016-05-17 (×2): 30 mL via ORAL
  Filled 2016-05-12 (×2): qty 30

## 2016-05-12 MED ORDER — FUROSEMIDE 10 MG/ML IJ SOLN
20.0000 mg | Freq: Once | INTRAMUSCULAR | Status: AC
  Administered 2016-05-12: 20 mg via INTRAVENOUS
  Filled 2016-05-12: qty 2

## 2016-05-12 MED ORDER — PEG-KCL-NACL-NASULF-NA ASC-C 100 G PO SOLR
0.5000 | Freq: Once | ORAL | Status: AC
  Administered 2016-05-13: 100 g via ORAL

## 2016-05-12 MED ORDER — SODIUM CHLORIDE 0.9 % IV SOLN
INTRAVENOUS | Status: DC
  Administered 2016-05-12 – 2016-05-17 (×6): via INTRAVENOUS

## 2016-05-12 MED ORDER — PEG-KCL-NACL-NASULF-NA ASC-C 100 G PO SOLR: 1.0000 | Freq: Once | ORAL | Status: DC

## 2016-05-12 MED ORDER — HEPARIN (PORCINE) IN NACL 100-0.45 UNIT/ML-% IJ SOLN
1000.0000 [IU]/h | INTRAMUSCULAR | Status: AC
  Administered 2016-05-12: 900 [IU]/h via INTRAVENOUS
  Filled 2016-05-12: qty 250

## 2016-05-12 MED ORDER — PEG-KCL-NACL-NASULF-NA ASC-C 100 G PO SOLR
0.5000 | Freq: Once | ORAL | Status: AC
  Administered 2016-05-12: 100 g via ORAL
  Filled 2016-05-12: qty 1

## 2016-05-12 NOTE — Progress Notes (Signed)
PROGRESS NOTE  Benjamin Hurley:[id number] DOB: 09-29-1925 DOA: 05/09/2016 PCP: No PCP Per Patient  HPI/Recap of past 83 hours: 83 year old male past medical history of atrial fibrillation on chronic anticoagulation and diabetes mellitus who said several hospitalizations at the [place] for C. difficile including one 2 weeks ago was evaluated at [facility] for several days of severe nausea and vomiting and very loose stools and reportedly had a chest x-ray that showed questionable free air. Patient then underwent a CT scan which did not note any type of bowel perforation, but did note a mass extending from the descending colon into inferior splenic hilum and with spleen involvement concerning for cancer. Hospitalists were contacted and patient was transferred over to [facility] long hospital.   Stools tested on admission negative for C. difficile.  Seen by general surgery and cardiology. GI consulted flexible sigmoidoscopy on Monday 3/26. Patient doing okay. Not much appetite and still has some discomfort in the left upper quadrant. Breathing okay.  Assessment/Plan: Principal Problem:  Suspected Colon cancer ([place]) with colonic mass causing partial obstruction extending into spleen: Discussed with general surgery and the patient is to be a surgery candidate, he would require a partial colectomy with splenectomy. CEA level pending. Nutritional supplementation with boost breeze, patient is not protein calorie malnourished. Cardiology to see for possible surgical clearance. Seen by palliative care and patient would like everything done. GI for flexible sigmoidoscopy on Monday.   Active Problems:  Chronic Atrial fibrillation with RVR [facility]): Chads 2 score of 7. Eliquis on hold and patient on IV heparin. Tolerating Cardizem drip. Heart rate under control. Awaiting echocardiogram. If unremarkable, patient cleared for potential surgery.   Hypomagnesemia: Replacing   Diabetes mellitus, type 2 ([place]):  With decreased by mouth, patient had hypoglycemia this morning. Checking A1c. Have decrease Lantus from 20 units down to 7. Noted CBG jump this morning to 315.  We'll turn off dextrose in IV fluids.   Code Status: Full code   Family Communication: Wife and daughter at the bedside   Disposition Plan: Depending on outcome of flex sig and potential surgery   Consultants: General surgery Cardiology Palliative care Gastroenterology   Procedures:  None   Antimicrobials:  None   DVT prophylaxis: IV heparin  Objective: Vitals:   05/11/16 1206 05/11/16 2100 05/12/16 0600 05/12/16 0915  BP: 124/68 129/68 (!) 110/98 138/82  Pulse: 77 60 74 65  Resp: _0 Temp:  98.2 F (36.8 C) 97.8 F (36.6 C)   TempSrc: Oral Oral Oral   SpO2: 100% 98% 100%   Weight:      Height:        Intake/Output Summary (Last 24 hours) at 05/12/16 1307 Last data filed at 05/12/16 0400  Gross per 24 hour  Intake          1268.31 ml  Output                0 ml  Net          1268.31 ml   Filed Weights   05/09/16 2100 05/10/16 0602 05/11/16 0625  Weight: 71.9 kg (158 lb 8.2 oz) 65.9 kg (145 lb 4.5 oz) 70.2 kg (154 lb 12.2 oz)    Exam: Unchanged from previous day.  General:  Alert and oriented 2, no acute distress   Cardiovascular: Irregular rhythm, Rate controlled   Respiratory: Clear to Auscultation bilaterally  Abdomen:  soft, mild distention, tender in left upper quadrant, hypoactive bowel  sounds   Musculoskeletal: no clubbing or cyanosis, trace pitting edema   Skin: no skin breaks, tears or lesions  Psychiatry: patient is appropriate, no evidence of psychoses    Data Reviewed: CBC:  Recent Labs Lab 05/10/16 0500 05/11/16 0433 05/12/16 0508  WBC 5.6 4.5 6.4  HGB 12.5* 10.9* 10.9*  HCT 37.1* 32.2* 31.9*  MCV 90.7 91.0 90.6  PLT 293 210 427   Basic Metabolic Panel:  Recent Labs Lab 05/10/16 0500 05/10/16 1200  NA 134* 130*  K 4.8 4.5  CL 104 101  CO2 22 21*   GLUCOSE 85 226*  BUN 18 18  CREATININE 1.01 1.07  CALCIUM 8.8* 8.5*  MG 1.9  --    GFR: Estimated Creatinine Clearance: 43.1 mL/min (by C-G formula based on SCr of 1.07 mg/dL). Liver Function Tests:  Recent Labs Lab 05/10/16 1200  AST 31  ALT 14*  ALKPHOS 68  BILITOT 0.7  PROT 6.8  ALBUMIN 3.4*   No results for input(s): LIPASE, AMYLASE in the last 168 hours. No results for input(s): AMMONIA in the last 168 hours. Coagulation Profile: No results for input(s): INR, PROTIME in the last 168 hours. Cardiac Enzymes:  Recent Labs Lab 05/09/16 2057 05/10/16 0500 05/10/16 1200  TROPONINI <0.03 <0.03 <0.03   BNP (last 3 results) No results for input(s): PROBNP in the last 8760 hours. HbA1C: No results for input(s): HGBA1C in the last 72 hours. CBG:  Recent Labs Lab 05/11/16 1202 05/11/16 1618 05/11/16 2057 05/12/16 0747 05/12/16 1143  GLUCAP 161* 267* 110* 154* 315*   Lipid Profile: No results for input(s): CHOL, HDL, LDLCALC, TRIG, CHOLHDL, LDLDIRECT in the last 72 hours. Thyroid Function Tests:  Recent Labs  05/11/16 0433  TSH 1.509   Anemia Panel: No results for input(s): VITAMINB12, FOLATE, FERRITIN, TIBC, IRON, RETICCTPCT in the last 72 hours. Urine analysis: No results found for: COLORURINE, APPEARANCEUR, LABSPEC, PHURINE, GLUCOSEU, HGBUR, BILIRUBINUR, KETONESUR, PROTEINUR, UROBILINOGEN, NITRITE, LEUKOCYTESUR Sepsis Labs: _0 (procalcitonin:4,lacticidven:4)  ) Recent Results (from the past 240 hour(s))  Gastrointestinal Panel by PCR , Stool     Status: None   Collection Time: 05/10/16  3:07 AM  Result Value Ref Range Status   Campylobacter species NOT DETECTED NOT DETECTED Final   Plesimonas shigelloides NOT DETECTED NOT DETECTED Final   Salmonella species NOT DETECTED NOT DETECTED Final   Yersinia enterocolitica NOT DETECTED NOT DETECTED Final   Vibrio species NOT DETECTED NOT DETECTED Final   Vibrio cholerae NOT DETECTED NOT DETECTED  Final   Enteroaggregative E coli (EAEC) NOT DETECTED NOT DETECTED Final   Enteropathogenic E coli (EPEC) NOT DETECTED NOT DETECTED Final   Enterotoxigenic E coli (ETEC) NOT DETECTED NOT DETECTED Final   Shiga like toxin producing E coli (STEC) NOT DETECTED NOT DETECTED Final   Shigella/Enteroinvasive E coli (EIEC) NOT DETECTED NOT DETECTED Final   Cryptosporidium NOT DETECTED NOT DETECTED Final   Cyclospora cayetanensis NOT DETECTED NOT DETECTED Final   Entamoeba histolytica NOT DETECTED NOT DETECTED Final   Giardia lamblia NOT DETECTED NOT DETECTED Final   Adenovirus F40/41 NOT DETECTED NOT DETECTED Final   Astrovirus NOT DETECTED NOT DETECTED Final   Norovirus GI/GII NOT DETECTED NOT DETECTED Final   Rotavirus A NOT DETECTED NOT DETECTED Final   Sapovirus (I, II, IV, and V) NOT DETECTED NOT DETECTED Final  C difficile quick scan w PCR reflex     Status: None   Collection Time: 05/10/16  3:07 AM  Result Value Ref Range Status  C Diff antigen NEGATIVE NEGATIVE Final   C Diff toxin NEGATIVE NEGATIVE Final   C Diff interpretation No C. difficile detected.  Final      Studies: No results found.  Scheduled Meds: . feeding supplement  1 Container Oral TID BM  . feeding supplement (GLUCERNA SHAKE)  237 mL Oral TID BM  . gabapentin  300 mg Oral QHS  . insulin aspart  0-15 Units Subcutaneous TID WC  . insulin glargine  7 Units Subcutaneous QHS  . latanoprost  1 drop Both Eyes QHS  . metoprolol tartrate  25 mg Oral BID  . peg 3350 powder  0.5 kit Oral Once   And  . [START ON 05/13/2016] peg 3350 powder  0.5 kit Oral Once  . polyvinyl alcohol  1 drop Both Eyes BID    Continuous Infusions: . dextrose 50 mL/hr at 05/11/16 2352  . diltiazem (CARDIZEM) infusion 5 mg/hr (05/11/16 2352)  . heparin 850 Units/hr (05/11/16 2352)     LOS: 3 days    Annita Brod, MD [facility] Pager [phone]  If 7PM-7AM, please contact night-coverage [email or web address] Password  [email or web address] 05/12/2016, 1:07 PM

## 2016-05-12 NOTE — Progress Notes (Signed)
ANTICOAGULATION CONSULT NOTE   Pharmacy Consult for Heparin Indication: atrial fibrillation  No Known Allergies  Patient Measurements: Height: 5\' 6"  (167.6 cm) Weight: 154 lb 12.2 oz (70.2 kg) IBW/kg (Calculated) : 63.8 Heparin Dosing Weight: 71.9  Vital Signs: Temp: 97.8 F (36.6 C) (03/25 0600) Temp Source: Oral (03/25 0600) BP: 110/98 (03/25 0600) Pulse Rate: 74 (03/25 0600)  Labs:  Recent Labs  05/09/16 2057  05/10/16 0500 05/10/16 1200 05/11/16 0433 05/11/16 1839 05/12/16 0508  HGB  --   < > 12.5*  --  10.9*  --  10.9*  HCT  --   --  37.1*  --  32.2*  --  31.9*  PLT  --   --  293  --  210  --  214  APTT  --   < > 91* 83* 158* 66* 83*  HEPARINUNFRC  --   --  >2.20*  --  2.14*  --  1.22*  CREATININE  --   --  1.01 1.07  --   --   --   TROPONINI <0.03  --  <0.03 <0.03  --   --   --   < > = values in this interval not displayed.  Estimated Creatinine Clearance: 43.1 mL/min (by C-G formula based on SCr of 1.07 mg/dL).  Medical History: Past Medical History:  Diagnosis Date  . A-fib ([place])    on Eliquis  . BPH (benign prostatic hyperplasia)   . Carotid stenosis   . Diabetes ([place])   . Essential hypertension   . Peripheral neuropathy (H[facility])    Medications:  Scheduled:  . feeding supplement  1 Container Oral TID BM  . feeding supplement (GLUCERNA SHAKE)  237 mL Oral TID BM  . gabapentin  300 mg Oral QHS  . insulin aspart  0-15 Units Subcutaneous TID WC  . insulin glargine  7 Units Subcutaneous QHS  . latanoprost  1 drop Both Eyes QHS  . metoprolol tartrate  25 mg Oral BID  . polyethylene glycol  17 g Oral TID  . polyvinyl alcohol  1 drop Both Eyes BID   Infusions:  . dextrose 50 mL/hr at 05/11/16 2352  . diltiazem (CARDIZEM) infusion 5 mg/hr (05/11/16 2352)  . heparin 850 Units/hr (05/11/16 2352)   Assessment: 76 yoM admitted with probable colon perforation and mass, in Afib, seen prev at [place]: for elevated HR - tried Diltiazem & Metoprolol, anti-nausea  meds. Hx CVA, on Apixaban 5mg  bid, LD 3/21 at 1700  Begin Heparin infusion for Afib, no bolus  aPTT ordered for baseline, use to measure Apixaban effect  Once aPTT and Heparin level correlate can monitor using only Heparin levels   3/24  AM labs: APTT is 158, supratherapeutic. HL still elevated due to apixaban  1800 level is 66  Hgb 12.5, plt 293  No bleeding issues reported Today, 3/25  0508 aptt=83 and HL=1.22, at goal using aptts, no infusion or bleeding issues per RN  Goal of Therapy:  APTT 66-102 sec Heparin level 0.3-0.7 units/ml Monitor platelets by anticoagulation protocol: Yes   Plan:   Continue heparin at 850 units/hr  Recheck confirmatory aptt today at 1 pm today   Benjamin Hurley 05/12/2016 6:28 AM

## 2016-05-12 NOTE — Progress Notes (Signed)
ANTICOAGULATION CONSULT NOTE   Pharmacy Consult for Heparin Indication: atrial fibrillation  No Known Allergies  Patient Measurements: Height: 5' 6"  (167.6 cm) Weight: 154 lb 12.2 oz (70.2 kg) IBW/kg (Calculated) : 63.8 Heparin Dosing Weight: 71.9  Vital Signs: Temp: 97.8 F (36.6 C) (03/25 0600) Temp Source: Oral (03/25 0600) BP: 138/82 (03/25 0915) Pulse Rate: 65 (03/25 0915)  Labs:  Recent Labs  05/09/16 2057  05/10/16 0500 05/10/16 1200 05/11/16 0433 05/11/16 1839 05/12/16 0508 05/12/16 1302  HGB  --   < > 12.5*  --  10.9*  --  10.9*  --   HCT  --   --  37.1*  --  32.2*  --  31.9*  --   PLT  --   --  293  --  210  --  214  --   APTT  --   < > 91* 83* 158* 66* 83* 65*  HEPARINUNFRC  --   --  >2.20*  --  2.14*  --  1.22*  --   CREATININE  --   --  1.01 1.07  --   --   --   --   TROPONINI <0.03  --  <0.03 <0.03  --   --   --   --   < > = values in this interval not displayed.  Estimated Creatinine Clearance: 43.1 mL/min (by C-G formula based on SCr of 1.07 mg/dL).  Medical History: Past Medical History:  Diagnosis Date  . A-fib ([place])    on Eliquis  . BPH (benign prostatic hyperplasia)   . Carotid stenosis   . Diabetes ([place])   . Essential hypertension   . Peripheral neuropathy (H[facility])    Medications:  Scheduled:  . feeding supplement  1 Container Oral TID BM  . feeding supplement (GLUCERNA SHAKE)  237 mL Oral TID BM  . gabapentin  300 mg Oral QHS  . insulin aspart  0-15 Units Subcutaneous TID WC  . insulin glargine  7 Units Subcutaneous QHS  . latanoprost  1 drop Both Eyes QHS  . metoprolol tartrate  25 mg Oral BID  . peg 3350 powder  0.5 kit Oral Once   And  . [START ON 05/13/2016] peg 3350 powder  0.5 kit Oral Once  . polyvinyl alcohol  1 drop Both Eyes BID   Infusions:  . sodium chloride    . diltiazem (CARDIZEM) infusion 5 mg/hr (05/11/16 2352)  . heparin 850 Units/hr (05/11/16 2352)   Assessment: 71 yoM admitted with probable colon  perforation and mass, in Afib, seen prev at [place]: for elevated HR - tried Diltiazem & Metoprolol, anti-nausea meds. Hx CVA, on Apixaban 69m bid, LD 3/21 at 1700  Begin Heparin infusion for Afib, no bolus  aPTT ordered for baseline, use to measure Apixaban effect  Once aPTT and Heparin level correlate can monitor using only Heparin levels  Today, 3/25  AM labs: APTT is 83, therapeutic. HL still elevated due to apixaban  1300 level is 65, subtherapeutic  Hgb 10.9, plt 214  No bleeding issues reported  Goal of Therapy:  APTT 66-102 sec Heparin level 0.3-0.7 units/ml Monitor platelets by anticoagulation protocol: Yes   Plan:   Increase heparin to 900 units/hr  Plan is to hold heparin after 0400 tomorrow 6 hours prior to colonoscopy   Recheck confirmatory aptt 8 hours after rate change    NRoyetta Asal PharmD, BCPS Pager [phone]/25/2018 1:52 PM

## 2016-05-12 NOTE — Consult Note (Signed)
Referring Provider: Dr. Redmond Pulling, CCS Primary Care Physician:  [place] Primary Gastroenterologist:  [place]  Reason for Consultation:  Colon mass  HPI: Benjamin Hurley is a 83 y.o. male with medical history significant of DM with peripheral neuropathy; HTN; carotid steonsis s/p CEA x 2; BPH; and afib on Eliquis presenting as a transfer from [facility] emergency room (patient's request to go elsewhere, ER called [facility]) with diagnosis of new-onset colon cancer.  His family reports that he remotely had back surgery - and had strokes before and after surgery.  About 2 years ago, he started with GI symptoms - midepigastric/LUQ abdominal pain/cramping, nausea.  C diff x 2 (remote and recently).  Has been nauseated 24/7, but family tells me no vomiting.  They tell me that he's had multiple tests and scans.  Has even had EGD's at the [place] but never had a colonoscopy.  They took him to [facility] on 3/22 and he had CT scan.  They told him it was perforation and then came back to report free air and a "big tumor" in his colon, that it was colon cancer.  He was transferred to [place].  Has been seen by surgery.  Apparently CT scan reported primary colon cancer in the proximal descending colon and infiltrating the splenic hilum and spleen.  This is resulting in a colon and distal small bowel obstruction.  There is a small amount of free air.  The mass is 6.2 x 4.8 cm.  Outside CT scan was reviewed by radiology and surgeons here and their assessment is the following:  He has colonic mass in the proximal left colon and splenic flexure that appears to be infiltrating the spleen. There is some mild fluid in this area but no free air or gross free air. There is no associated lymphadenopathy. He also has evidence of diverticulosis.  His Eliquis has been on hold since he has been here.  he is on heparin gtt.  Cardiology has seen him and ECHO is pending.   He is currently tolerating liquids in small amounts.  Says that he  can only drink a little at a time because he gets full.  Is sipping on some Miralax now.  Cdiff here was negative.  Daughter and wife were at bedside this AM and help provide history.   Past Medical History:  Diagnosis Date  . A-fib ([place])    on Eliquis  . BPH (benign prostatic hyperplasia)   . Carotid stenosis   . Diabetes ([place])   . Essential hypertension   . Peripheral neuropathy [facility])     Past Surgical History:  Procedure Laterality Date  . CAROTID ENDARTERECTOMY Bilateral   . lumber spine    . TRANSURETHRAL RESECTION OF PROSTATE      Prior to Admission medications   Medication Sig Start Date End Date Taking? Authorizing Provider  apixaban (ELIQUIS) 5 MG TABS tablet Take 5 mg by mouth 2 (two) times daily.   Yes Historical Provider, MD  carboxymethylcellulose (REFRESH PLUS) 0.5 % SOLN Place 1 drop into both eyes 2 (two) times daily.   Yes Historical Provider, MD  diltiazem (DILACOR XR) 120 MG 24 hr capsule Take 120 mg by mouth daily.   Yes Historical Provider, MD  feeding supplement, GLUCERNA SHAKE, (GLUCERNA SHAKE) LIQD Take 237 mLs by mouth daily.   Yes Historical Provider, MD  gabapentin (NEURONTIN) 300 MG capsule Take 300 mg by mouth at bedtime. 03/13/16  Yes Historical Provider, MD  insulin glargine (LANTUS) 100  UNIT/ML injection Inject 15-20 Units into the skin at bedtime. Patients wife states that she does a three day cycle. The first two days she gives the patient 20 units. Then on the third night she checks blood sugar. If blood sugar is under 100 she gives patient 15 units but if it is over 100 she gives the patient 20 units.   Yes Historical Provider, MD  latanoprost (XALATAN) 0.005 % ophthalmic solution Place 1 drop into both eyes at bedtime.   Yes Historical Provider, MD  metFORMIN (GLUCOPHAGE) 500 MG tablet Take 500 mg by mouth 2 (two) times daily with a meal.   Yes Historical Provider, MD  pantoprazole (PROTONIX) 40 MG tablet Take 40 mg by mouth daily.   Yes Historical  Provider, MD  promethazine (PHENERGAN) 25 MG tablet Take 12.5 mg by mouth every 4 (four) hours as needed for nausea or vomiting.   Yes Historical Provider, MD  simethicone (MYLICON) 80 MG chewable tablet Chew 80 mg by mouth every 6 (six) hours as needed for flatulence.   Yes Historical Provider, MD  sucralfate (CARAFATE) 1 GM/10ML suspension Take 1 g by mouth 2 (two) times daily.   Yes Historical Provider, MD    Current Facility-Administered Medications  Medication Dose Route Frequency Provider Last Rate Last Dose  . acetaminophen (TYLENOL) tablet 650 mg  650 mg Oral Q4H PRN Karmen Bongo, MD   650 mg at 05/10/16 2146  . dextrose 5 % solution   Intravenous Continuous Annita Brod, MD 50 mL/hr at 05/11/16 2352    . diltiazem (CARDIZEM) 100 mg in dextrose 5 % 100 mL (1 mg/mL) infusion  5 mg/hr Intravenous Continuous Annita Brod, MD 5 mL/hr at 05/11/16 2352 5 mg/hr at 05/11/16 2352  . feeding supplement (BOOST / RESOURCE BREEZE) liquid 1 Container  1 Container Oral TID BM Annita Brod, MD   1 Container at 05/11/16 2019  . feeding supplement (GLUCERNA SHAKE) (GLUCERNA SHAKE) liquid 237 mL  237 mL Oral TID BM Greer Pickerel, MD   237 mL at 05/11/16 2018  . furosemide (LASIX) injection 20 mg  20 mg Intravenous Once Arnoldo Lenis, MD      . gabapentin (NEURONTIN) capsule 300 mg  300 mg Oral QHS Karmen Bongo, MD   300 mg at 05/11/16 2150  . heparin ADULT infusion 100 units/mL (25000 units/249mL sodium chloride 0.45%)  850 Units/hr Intravenous Continuous Nikola Glogovac, RPH 8.5 mL/hr at 05/11/16 2352 850 Units/hr at 05/11/16 2352  . insulin aspart (novoLOG) injection 0-15 Units  0-15 Units Subcutaneous TID WC Karmen Bongo, MD   8 Units at 05/11/16 1802  . insulin glargine (LANTUS) injection 7 Units  7 Units Subcutaneous QHS Annita Brod, MD   7 Units at 05/11/16 2150  . latanoprost (XALATAN) 0.005 % ophthalmic solution 1 drop  1 drop Both Eyes QHS Karmen Bongo, MD   1 drop at  05/11/16 2151  . metoprolol tartrate (LOPRESSOR) tablet 25 mg  25 mg Oral BID Karmen Bongo, MD   25 mg at 05/11/16 2150  . morphine 4 MG/ML injection 2 mg  2 mg Intravenous Q2H PRN Karmen Bongo, MD      . ondansetron [facility]) injection 4 mg  4 mg Intravenous Q6H PRN Karmen Bongo, MD   4 mg at 05/09/16 2157  . polyethylene glycol (MIRALAX / GLYCOLAX) packet 17 g  17 g Oral TID Annita Brod, MD   Stopped at 05/12/16 [phone]  . polyvinyl alcohol (LIQUIFILM TEARS)  1.4 % ophthalmic solution 1 drop  1 drop Both Eyes BID Karmen Bongo, MD   1 drop at 05/11/16 2151    Allergies as of 05/09/2016  . (No Known Allergies)    History reviewed. No pertinent family history.  Social History   Social History  . Marital status: Married    Spouse name: N/A  . Number of children: N/A  . Years of education: N/A   Occupational History  . retired    Social History Main Topics  . Smoking status: Former Smoker    Types: Pipe    Quit date: 1968  . Smokeless tobacco: Former Systems developer    Types: Chew    Quit date: 1968  . Alcohol use No  . Drug use: No  . Sexual activity: Not on file   Other Topics Concern  . Not on file   Social History Narrative  . No narrative on file    Review of Systems: ROS is O/W negative except as mentioned in HPI.  Physical Exam: Vital signs in last 24 hours: Temp:  [97.8 F (36.6 C)-98.2 F (36.8 C)] 97.8 F (36.6 C) (03/25 0600) Pulse Rate:  [60-77] 74 (03/25 0600) Resp:  [16-20] 16 (03/25 0600) BP: (105-129)/(68-98) 110/98 (03/25 0600) SpO2:  [98 %-100 %] 100 % (03/25 0600) Last BM Date: 05/11/16 (per pt) General:  Alert, Well-developed, well-nourished, pleasant and cooperative in NAD Head:  Normocephalic and atraumatic. Eyes:  Sclera clear, no icterus.  Conjunctiva pink. Ears:  Normal auditory acuity. Mouth:  No deformity or lesions.   Lungs:  Clear throughout to auscultation.  No wheezes, crackles, or rhonchi.  Heart:  Irregular.  No  M/R/G. Abdomen:  Soft,nontender, BS active,nonpalp mass or hsm.   Rectal:  Deferred  Msk:  Symmetrical without gross deformities. Pulses:  Normal pulses noted. Extremities:  Without clubbing or edema. Neurologic:  Alert and oriented x 4;  grossly normal neurologically. Skin:  Intact without significant lesions or rashes. Psych:  Alert and cooperative. Normal mood and affect.  Intake/Output from previous day: 03/24 0701 - 03/25 0700 In: 1268.3 [P.O.:120; I.V.:1148.3] Out: -   Lab Results:  Recent Labs  05/10/16 0500 05/11/16 0433 05/12/16 0508  WBC 5.6 4.5 6.4  HGB 12.5* 10.9* 10.9*  HCT 37.1* 32.2* 31.9*  PLT 293 210 214   BMET  Recent Labs  05/10/16 0500 05/10/16 1200  NA 134* 130*  K 4.8 4.5  CL 104 101  CO2 22 21*  GLUCOSE 85 226*  BUN 18 18  CREATININE 1.01 1.07  CALCIUM 8.8* 8.5*   LFT  Recent Labs  05/10/16 1200  PROT 6.8  ALBUMIN 3.4*  AST 31  ALT 14*  ALKPHOS 68  BILITOT 0.7   IMPRESSION:  -Colonic mass in the proximal left colon and splenic flexure that appears to be infiltrating the spleen. There is some mild fluid in this area but no free air or gross free air. -Atrial fibrillation:  On Eliquis at home but on heparin gtt here.  ECHO pending per cardiology.  PLAN: -Tentatively planning for colonoscopy/flex sig on 3/26 with Dr. Fuller Plan on 10 AM. -Heparin gtt on hold starting at 0400. -Check abdominal films today. -Drinking Miralax currently and will give split dose moviprep starting later this evening as well.  ZEHR, JESSICA D.  05/12/2016, 9:05 AM  Pager number [phone]  GI ATTENDING  History, laboratories, x-ray report reviewed. Surgical consultation note reviewed. Patient personally seen and examined. Family in room. Elderly gentleman presents with nausea  vomiting and left upper quadrant pain. Found to have probable left-sided colon mass with infiltrative component. Tolerated several doses of MiraLAX yesterday and today with several bowel  movements. We will obtain repeat plain films of the abdomen today. Plan colonoscopy tomorrow around 10 AM with Dr. Fuller Plan. The patient is high-risk given his age and comorbidities.The nature of the procedure, as well as the risks, benefits, and alternatives were carefully and thoroughly reviewed with the patient. Ample time for discussion and questions allowed. The patient understood, was satisfied, and agreed to proceed.. Heparin being held 6 hours prior. Cardiology workup underway. Cardiology note reviewed. Echo plans noted.  Docia Chuck. Geri Seminole., M.D. [facility] Division of Gastroenterology

## 2016-05-12 NOTE — Progress Notes (Signed)
Subjective:    Mild SOB overnight  Objective:   Temp:  [97.8 F (36.6 C)-98.2 F (36.8 C)] 97.8 F (36.6 C) (03/25 0600) Pulse Rate:  [60-77] 74 (03/25 0600) Resp:  [16-20] 16 (03/25 0600) BP: (105-129)/(68-98) 110/98 (03/25 0600) SpO2:  [98 %-100 %] 100 % (03/25 0600) Last BM Date: 05/11/16 (per pt)  Filed Weights   05/09/16 2100 05/10/16 0602 05/11/16 0625  Weight: 158 lb 8.2 oz (71.9 kg) 145 lb 4.5 oz (65.9 kg) 154 lb 12.2 oz (70.2 kg)    Intake/Output Summary (Last 24 hours) at 05/12/16 0757 Last data filed at 05/12/16 0400  Gross per 24 hour  Intake          1268.31 ml  Output                0 ml  Net          1268.31 ml    Telemetry: rate controlled afib  Exam:  General: NAD  HEENT: sclera clear, throat clear  Resp: CTAB  Cardiac: irreg, no m/r/g, mildly elevated jvd  UU:VOZDGUY soft, NT, ND  MSK: trace bilateral LE edema  Neuro: no focal deficits  Psych: appropriate affect  Lab Results:  Basic Metabolic Panel:  Recent Labs Lab 05/10/16 0500 05/10/16 1200  NA 134* 130*  K 4.8 4.5  CL 104 101  CO2 22 21*  GLUCOSE 85 226*  BUN 18 18  CREATININE 1.01 1.07  CALCIUM 8.8* 8.5*  MG 1.9  --     Liver Function Tests:  Recent Labs Lab 05/10/16 1200  AST 31  ALT 14*  ALKPHOS 68  BILITOT 0.7  PROT 6.8  ALBUMIN 3.4*    CBC:  Recent Labs Lab 05/10/16 0500 05/11/16 0433 05/12/16 0508  WBC 5.6 4.5 6.4  HGB 12.5* 10.9* 10.9*  HCT 37.1* 32.2* 31.9*  MCV 90.7 91.0 90.6  PLT 293 210 214    Cardiac Enzymes:  Recent Labs Lab 05/09/16 2057 05/10/16 0500 05/10/16 1200  TROPONINI <0.03 <0.03 <0.03    BNP: No results for input(s): PROBNP in the last 8760 hours.  Coagulation: No results for input(s): INR in the last 168 hours.  ECG:   Medications:   Scheduled Medications: . feeding supplement  1 Container Oral TID BM  . feeding supplement (GLUCERNA SHAKE)  237 mL Oral TID BM  . gabapentin  300 mg Oral QHS  .  insulin aspart  0-15 Units Subcutaneous TID WC  . insulin glargine  7 Units Subcutaneous QHS  . latanoprost  1 drop Both Eyes QHS  . metoprolol tartrate  25 mg Oral BID  . polyethylene glycol  17 g Oral TID  . polyvinyl alcohol  1 drop Both Eyes BID     Infusions: . dextrose 50 mL/hr at 05/11/16 2352  . diltiazem (CARDIZEM) infusion 5 mg/hr (05/11/16 2352)  . heparin 850 Units/hr (05/11/16 2352)     PRN Medications:  acetaminophen, morphine injection, ondansetron (ZOFRAN) IV     Assessment/Plan   1. Afib - on dilt gtt at 5, lopressor 25mg  bid. Rates controlled this AM.  - given his intermittent nausea and vomiting, possible need to be NPO for procedures, we will continue IV dilt at this time. Can convert to oral regimen once N/V has resolved and no potential need for NPO status. From notes possible sigmoidoscopy by GI Monday, possible colectomy next week - CHADS2Vasc score is 7, eliquis on hold on heparin drip in anticipation of  invasive workup for abdominal mass.   2. Abdominal mass - suspected cancer, workup per primary team and oncology - from notes he is being considered for partial colectomy and splenectomy.   3. Preoperative clearance - patient being considered for colectomy for abdominal mass - exertion is generally limited due to chronic leg weakness. Reports with a walker he is able to walk approx 1 block. Denies any exertional chest pain or SOB - we will obtain echo. Would not delay possible surgery for noninvasive ischemic testing due to the urgency of the surgery. Pending echo would likely clear for surgery.   4. LE edema - mild edema, mild JVD. Some mild SOB overnight - f/u echo results. We will give lasix 20mg  IV x 1.     Carlyle Dolly, M.D

## 2016-05-13 ENCOUNTER — Encounter (HOSPITAL_COMMUNITY): Admission: AD | Disposition: A | Discharge status: Home Health | Payer: Self-pay | Attending: Internal Medicine

## 2016-05-13 ENCOUNTER — Inpatient Hospital Stay (HOSPITAL_COMMUNITY): Payer: PPO | Admitting: Registered Nurse

## 2016-05-13 ENCOUNTER — Encounter (HOSPITAL_COMMUNITY): Payer: Self-pay | Admitting: *Deleted

## 2016-05-13 ENCOUNTER — Inpatient Hospital Stay (HOSPITAL_COMMUNITY): Payer: PPO

## 2016-05-13 DIAGNOSIS — R933 Abnormal findings on diagnostic imaging of other parts of digestive tract: Secondary | ICD-10-CM

## 2016-05-13 DIAGNOSIS — R1012 Left upper quadrant pain: Secondary | ICD-10-CM

## 2016-05-13 DIAGNOSIS — K6389 Other specified diseases of intestine: Secondary | ICD-10-CM

## 2016-05-13 DIAGNOSIS — K56699 Other intestinal obstruction unspecified as to partial versus complete obstruction: Secondary | ICD-10-CM

## 2016-05-13 DIAGNOSIS — Z01810 Encounter for preprocedural cardiovascular examination: Secondary | ICD-10-CM

## 2016-05-13 DIAGNOSIS — D123 Benign neoplasm of transverse colon: Secondary | ICD-10-CM

## 2016-05-13 DIAGNOSIS — I4891 Unspecified atrial fibrillation: Secondary | ICD-10-CM

## 2016-05-13 HISTORY — PX: COLONOSCOPY WITH PROPOFOL: SHX5780

## 2016-05-13 LAB — GLUCOSE, CAPILLARY
GLUCOSE-CAPILLARY: 187 mg/dL — AB (ref 65–99)
Glucose-Capillary: 136 mg/dL — ABNORMAL HIGH (ref 65–99)
Glucose-Capillary: 108 mg/dL — ABNORMAL HIGH (ref 65–99)
Glucose-Capillary: 186 mg/dL — ABNORMAL HIGH (ref 65–99)

## 2016-05-13 LAB — HEPARIN LEVEL (UNFRACTIONATED): Heparin Unfractionated: 0.69 IU/mL (ref 0.30–0.70)

## 2016-05-13 LAB — ECHOCARDIOGRAM COMPLETE
HEIGHTINCHES: 66 in
WEIGHTICAEL: 2627.88 [oz_av]

## 2016-05-13 LAB — CBC
HCT: 39.4 % (ref 39.0–52.0)
HEMOGLOBIN: 12.9 g/dL — AB (ref 13.0–17.0)
MCH: 29.7 pg (ref 26.0–34.0)
MCHC: 32.7 g/dL (ref 30.0–36.0)
MCV: 90.6 fL (ref 78.0–100.0)
Platelets: 235 10*3/uL (ref 150–400)
RBC: 4.35 MIL/uL (ref 4.22–5.81)
RDW: 14.3 % (ref 11.5–15.5)
WBC: 6.9 10*3/uL (ref 4.0–10.5)

## 2016-05-13 LAB — APTT
aPTT: 44 seconds — ABNORMAL HIGH (ref 24–36)
aPTT: 64 seconds — ABNORMAL HIGH (ref 24–36)

## 2016-05-13 SURGERY — COLONOSCOPY WITH PROPOFOL
Anesthesia: Monitor Anesthesia Care

## 2016-05-13 MED ORDER — PROPOFOL 500 MG/50ML IV EMUL
INTRAVENOUS | Status: DC | PRN
  Administered 2016-05-13: 100 ug/kg/min via INTRAVENOUS

## 2016-05-13 MED ORDER — PROPOFOL 10 MG/ML IV BOLUS
INTRAVENOUS | Status: DC | PRN
  Administered 2016-05-13: 20 mg via INTRAVENOUS

## 2016-05-13 MED ORDER — SPOT INK MARKER SYRINGE KIT
PACK | SUBMUCOSAL | Status: AC
  Filled 2016-05-13: qty 5

## 2016-05-13 MED ORDER — LACTATED RINGERS IV SOLN
INTRAVENOUS | Status: DC | PRN
  Administered 2016-05-13: 10:00:00 via INTRAVENOUS

## 2016-05-13 MED ORDER — PHENYLEPHRINE 40 MCG/ML (10ML) SYRINGE FOR IV PUSH (FOR BLOOD PRESSURE SUPPORT)
PREFILLED_SYRINGE | INTRAVENOUS | Status: AC
  Filled 2016-05-13: qty 10

## 2016-05-13 MED ORDER — PHENYLEPHRINE 40 MCG/ML (10ML) SYRINGE FOR IV PUSH (FOR BLOOD PRESSURE SUPPORT)
PREFILLED_SYRINGE | INTRAVENOUS | Status: DC | PRN
  Administered 2016-05-13 (×7): 80 ug via INTRAVENOUS

## 2016-05-13 MED ORDER — EPHEDRINE SULFATE-NACL 50-0.9 MG/10ML-% IV SOSY
PREFILLED_SYRINGE | INTRAVENOUS | Status: DC | PRN
  Administered 2016-05-13 (×3): 5 mg via INTRAVENOUS

## 2016-05-13 MED ORDER — SPOT INK MARKER SYRINGE KIT
PACK | SUBMUCOSAL | Status: DC | PRN
  Administered 2016-05-13: 3 mL via SUBMUCOSAL

## 2016-05-13 MED ORDER — PROPOFOL 10 MG/ML IV BOLUS
INTRAVENOUS | Status: AC
Start: 2016-05-13 — End: 2016-05-13
  Filled 2016-05-13: qty 40

## 2016-05-13 MED ORDER — HEPARIN (PORCINE) IN NACL 100-0.45 UNIT/ML-% IJ SOLN
1000.0000 [IU]/h | INTRAMUSCULAR | Status: DC
  Administered 2016-05-13 – 2016-05-17 (×4): 1000 [IU]/h via INTRAVENOUS
  Filled 2016-05-13 (×4): qty 250

## 2016-05-13 MED ORDER — LIDOCAINE 2% (20 MG/ML) 5 ML SYRINGE
INTRAMUSCULAR | Status: AC
  Filled 2016-05-13: qty 5

## 2016-05-13 MED ORDER — SODIUM CHLORIDE 0.9 % IV SOLN: INTRAVENOUS | Status: DC

## 2016-05-13 MED ORDER — LIDOCAINE 2% (20 MG/ML) 5 ML SYRINGE
INTRAMUSCULAR | Status: DC | PRN
  Administered 2016-05-13: 100 mg via INTRAVENOUS

## 2016-05-13 SURGICAL SUPPLY — 22 items

## 2016-05-13 NOTE — Progress Notes (Signed)
Progress Note  Patient Name: Benjamin Hurley Date of Encounter: 05/13/2016  Primary Cardiologist: [facility], Dr Harl Bowie  Patient Profile     83 y.o. male w/ hx  DM with peripheral neuropathy; HTN; carotid steonsis s/p CEA x 2; BPH; and afib on Eliquis, admitted from [facility] 03/22 w/ new dx colon CA. Cards following for elevated HR  Subjective   Feels his heart skip at times, not light-headed or dizzy. No CP, no SOB. No problems w/ colonoscopy.  Inpatient Medications    Scheduled Meds: . feeding supplement  1 Container Oral TID BM  . feeding supplement (GLUCERNA SHAKE)  237 mL Oral TID BM  . gabapentin  300 mg Oral QHS  . insulin aspart  0-15 Units Subcutaneous TID WC  . insulin glargine  7 Units Subcutaneous QHS  . latanoprost  1 drop Both Eyes QHS  . metoprolol tartrate  25 mg Oral BID  . polyvinyl alcohol  1 drop Both Eyes BID   Continuous Infusions: . sodium chloride    . sodium chloride 50 mL/hr at 05/12/16 1400  . diltiazem (CARDIZEM) infusion 5 mg/hr (05/12/16 2013)   PRN Meds: acetaminophen, alum & mag hydroxide-simeth, morphine injection, ondansetron (ZOFRAN) IV   Vital Signs    Vitals:   05/12/16 0915 05/12/16 1428 05/12/16 2147 05/13/16 0630  BP: 138/82 115/75 129/61 113/69  Pulse: 65 77 65 65  Resp:  16 17 17   Temp:  97.7 F (36.5 C) 97.6 F (36.4 C) 98.2 F (36.8 C)  TempSrc:  Oral Oral Oral  SpO2:  100% 98% 99%  Weight:    164 lb 3.9 oz (74.5 kg)  Height:        Intake/Output Summary (Last 24 hours) at 05/13/16 0803 Last data filed at 05/13/16 0600  Gross per 24 hour  Intake          1619.78 ml  Output                1 ml  Net          1618.78 ml   Filed Weights   05/10/16 0602 05/11/16 0625 05/13/16 0630  Weight: 145 lb 4.5 oz (65.9 kg) 154 lb 12.2 oz (70.2 kg) 164 lb 3.9 oz (74.5 kg)    Telemetry    Atrial fib, rate generally < 100, no sig pauses - Personally Reviewed  ECG    n/a - Personally Reviewed  Physical Exam   General:  Well developed, well nourished, male appearing in no acute distress. Head: Normocephalic, atraumatic.  Neck: Supple without bruits, JVD 9 cm. Lungs:  Resp regular and unlabored, decreased BS bases. Heart: Irreg R&R, S1, S2, no S3, S4, soft murmur; no rub. Abdomen: Soft, non-tender, non-distended with normoactive bowel sounds. No hepatomegaly. No rebound/guarding. No obvious abdominal masses. Extremities: No clubbing, cyanosis, no edema. Distal pedal pulses are 2+ bilaterally. Neuro: Alert and oriented X 3. Moves all extremities spontaneously. Psych: Normal affect.  Labs    Hematology Recent Labs Lab 05/11/16 0433 05/12/16 0508 05/13/16 0456  WBC 4.5 6.4 6.9  RBC 3.54* 3.52* 4.35  HGB 10.9* 10.9* 12.9*  HCT 32.2* 31.9* 39.4  MCV 91.0 90.6 90.6  MCH 30.8 31.0 29.7  MCHC 33.9 34.2 32.7  RDW 14.5 14.5 14.3  PLT 210 214 235    Chemistry Recent Labs Lab 05/10/16 0500 05/10/16 1200  NA 134* 130*  K 4.8 4.5  CL 104 101  CO2 22 21*  GLUCOSE 85 226*  BUN 18  18  CREATININE 1.01 1.07  CALCIUM 8.8* 8.5*  PROT  --  6.8  ALBUMIN  --  3.4*  AST  --  31  ALT  --  14*  ALKPHOS  --  68  BILITOT  --  0.7  GFRNONAA >60 60*  GFRAA >60 >60  ANIONGAP 8 8     Cardiac Enzymes Recent Labs Lab 05/09/16 2057 05/10/16 0500 05/10/16 1200  TROPONINI <0.03 <0.03 <0.03    Radiology    Dg Abd 2 Views Result Date: 05/12/2016 CLINICAL DATA:  Colonic mass, abdominal pain EXAM: ABDOMEN - 2 VIEW COMPARISON:  None. FINDINGS: There is mild gaseous distension of small bowel in left mid abdomen suspicious for ileus. Moderate distension of the right colon and proximal transverse colon with gas suspicious for ileus or early colonic obstruction. Nonspecific air-fluid levels are noted within colon. No evidence of free abdominal air. IMPRESSION: There is mild gaseous distension of small bowel in left mid abdomen suspicious for ileus. Moderate distension of the right colon and proximal transverse  colon with gas suspicious for ileus or early colonic obstruction. Nonspecific air-fluid levels are noted within colon. No evidence of free abdominal air. Electronically Signed   By: Lahoma Crocker M.D.   On: 05/12/2016 15:55    Cardiac Studies   ECHO: 05/13/2016 ordered  Patient Profile     83 y.o. male w/ hx  DM with peripheral neuropathy; HTN; carotid steonsis s/p CEA x 2; BPH; afib on Eliquis, admitted from [facility] 03/22 w/ new dx colon CA. Cards following for elevated HR   Assessment & Plan   Principal Problem:   Colon cancer ([place]) Active Problems:   Atrial fibrillation with RVR (HCC)   Hypomagnesemia   Diabetes mellitus, type 2 ([place])   Encounter for palliative care   Goals of care, counseling/discussion   Nausea without vomiting   1. Chronic/Persistent Afib - on dilt gtt at 5, lopressor 25mg  bid. Rates controlled this AM. Reasonable BP's.  - given his intermittent nausea and vomiting, continue IV dilt today. - If liquids go well at lunch, change Dilt to po, if no further invasive testing required - CHADS2Vasc score is 5 (age x 2, DM, HTN, PAD), Eliquis on hold   2. Abdominal mass - suspected cancer, workup per primary team and oncology - from notes he is being considered for partial colectomy and splenectomy.   3. Preoperative clearance - patient being considered for colectomy for abdominal mass - exertion is generally limited due to chronic leg weakness. Reports with a walker he is able to walk approx 1 block. Denies any exertional chest pain or SOB - we will obtain echo. Would not delay possible surgery for noninvasive ischemic testing due to the urgency of the surgery. Pending echo would likely clear for surgery.   4. LE edema - mild edema, mild JVD. Some mild SOB overnight - f/u echo results. Had Lasix 20mg  IV x 1 on 03/25 - mild JVD but do not feel needs additional IV Lasix now.  Otherwise, per IM   Signed, Rosaria Ferries , PA-C 8:03 AM 05/13/2016 Pager:  [phone]   I have seen, examined and evaluated the patient this PM along with Ms. Ahmed Prima, PA.  After reviewing all the available data and chart, we discussed the patients laboratory, study & physical findings as well as symptoms in detail. I agree with her findings, examination as well as impression recommendations as per our discussion.    Afib rate controled with BB& dilt.  AC on hold. Agree with Echo for risk stratification, but unlikely any further pre-op evaluation needed.   Will follow   Glenetta Hew, M.D., M.S. Interventional Cardiologist   Pager # [phone] Phone # [phone] [address] [place], [place] [address]

## 2016-05-13 NOTE — Anesthesia Postprocedure Evaluation (Signed)
Anesthesia Post Note  Patient: Benjamin Hurley  Procedure(s) Performed: Procedure(s) (LRB): COLONOSCOPY WITH PROPOFOL (N/A)  Patient location during evaluation: PACU Anesthesia Type: MAC Level of consciousness: awake and alert Pain management: pain level controlled Vital Signs Assessment: post-procedure vital signs reviewed and stable Respiratory status: spontaneous breathing, nonlabored ventilation and respiratory function stable Cardiovascular status: stable and blood pressure returned to baseline Anesthetic complications: no       Last Vitals:  Vitals:   05/13/16 1034 05/13/16 1107  BP: (!) 82/37 114/62  Pulse: 75 87  Resp: 12 15  Temp: 36.4 C 36.4 C    Last Pain:  Vitals:   05/13/16 1107  TempSrc: Oral  PainSc:                  Lynda Rainwater

## 2016-05-13 NOTE — Interval H&P Note (Signed)
History and Physical Interval Note:  05/13/2016 9:35 AM  Richardson Dopp  has presented today for surgery, with the diagnosis of Colon mass  The various methods of treatment have been discussed with the patient and family. After consideration of risks, benefits and other options for treatment, the patient has consented to  Procedure(s): COLONOSCOPY WITH PROPOFOL (N/A) as a surgical intervention .  The patient's history has been reviewed, patient examined, no change in status, stable for surgery.  I have reviewed the patient's chart and labs.  Questions were answered to the patient's satisfaction.     Pricilla Riffle. [place] Plan

## 2016-05-13 NOTE — Progress Notes (Signed)
ANTICOAGULATION CONSULT NOTE   Pharmacy Consult for Heparin Indication: atrial fibrillation  No Known Allergies  Patient Measurements: Height: 5\' 6"  (167.6 cm) Weight: 164 lb 3.9 oz (74.5 kg) IBW/kg (Calculated) : 63.8 Heparin Dosing Weight: 71.9  Vital Signs: Temp: 97.6 F (36.4 C) (03/26 1107) Temp Source: Oral (03/26 1107) BP: 114/62 (03/26 1107) Pulse Rate: 87 (03/26 1107)  Labs:  Recent Labs  05/10/16 1200  05/11/16 0433  05/12/16 0508 05/12/16 1302 05/13/16 0018 05/13/16 0456  HGB  --   < > 10.9*  --  10.9*  --   --  12.9*  HCT  --   --  32.2*  --  31.9*  --   --  39.4  PLT  --   --  210  --  214  --   --  235  APTT 83*  --  158*  < > 83* 65* 64* 44*  HEPARINUNFRC  --   --  2.14*  --  1.22*  --   --  0.69  CREATININE 1.07  --   --   --   --   --   --   --   TROPONINI <0.03  --   --   --   --   --   --   --   < > = values in this interval not displayed.  Estimated Creatinine Clearance: 43.1 mL/min (by C-G formula based on SCr of 1.07 mg/dL).  Medical History: Past Medical History:  Diagnosis Date  . A-fib ([place])    on Eliquis  . BPH (benign prostatic hyperplasia)   . Carotid stenosis   . Diabetes ([place])   . Essential hypertension   . Peripheral neuropathy (HCC)    Medications:  Scheduled:  . feeding supplement  1 Container Oral TID BM  . feeding supplement (GLUCERNA SHAKE)  237 mL Oral TID BM  . gabapentin  300 mg Oral QHS  . insulin aspart  0-15 Units Subcutaneous TID WC  . insulin glargine  7 Units Subcutaneous QHS  . latanoprost  1 drop Both Eyes QHS  . metoprolol tartrate  25 mg Oral BID  . polyvinyl alcohol  1 drop Both Eyes BID   Infusions:  . sodium chloride 50 mL/hr at 05/12/16 1400  . diltiazem (CARDIZEM) infusion 5 mg/hr (05/13/16 0943)  . heparin     Assessment: 29 yoM admitted with probable colon perforation and mass, in Afib, seen prev at [place]: for elevated HR - tried Diltiazem & Metoprolol, anti-nausea meds. Hx CVA, on Apixaban 5mg   bid, LD 3/21 at 1700  Begin Heparin infusion for Afib, no bolus  aPTT ordered for baseline, use to measure Apixaban effect  Once aPTT and Heparin level correlate can monitor using only Heparin levels  05/13/2016:   AM labs: APTT is sub-therapeutic, but drawn after heparin stopped for procedure. HL still elevated due to apixaban.  CBC- Hg improved, Pltc stable  No bleeding issues reported   Goal of Therapy:  APTT 66-102 sec Heparin level 0.3-0.7 units/ml Monitor platelets by anticoagulation protocol: Yes   Plan:   At 4p today, resume heparin drip at 1000 units/hr  Check 8hr heparin level & aPTT  Daily heparin level & aPTT   F/U surgery plans, OK to resume Apixaban in 48hrs from GI standpoint.   Netta Cedars, PharmD, BCPS Pager: [phone] 05/13/2016 11:26 AM

## 2016-05-13 NOTE — Progress Notes (Signed)
ANTICOAGULATION CONSULT NOTE   Pharmacy Consult for Heparin Indication: atrial fibrillation  No Known Allergies  Patient Measurements: Height: _0  (167.6 cm) Weight: 154 lb 12.2 oz (70.2 kg) IBW/kg (Calculated) : 63.8 Heparin Dosing Weight: 71.9  Vital Signs: Temp: 97.6 F (36.4 C) (03/25 2147) Temp Source: Oral (03/25 2147) BP: 129/61 (03/25 2147) Pulse Rate: 65 (03/25 2147)  Labs:  Recent Labs  05/10/16 0500 05/10/16 1200 05/11/16 0433  05/12/16 0508 05/12/16 1302 05/13/16 0018  HGB 12.5*  --  10.9*  --  10.9*  --   --   HCT 37.1*  --  32.2*  --  31.9*  --   --   PLT 293  --  210  --  214  --   --   APTT 91* 83* 158*  < > 83* 65* 64*  HEPARINUNFRC >2.20*  --  2.14*  --  1.22*  --   --   CREATININE 1.01 1.07  --   --   --   --   --   TROPONINI <0.03 <0.03  --   --   --   --   --   < > = values in this interval not displayed.  Estimated Creatinine Clearance: 43.1 mL/min (by C-G formula based on SCr of 1.07 mg/dL).  Medical History: Past Medical History:  Diagnosis Date  . A-fib ([place])    on Eliquis  . BPH (benign prostatic hyperplasia)   . Carotid stenosis   . Diabetes ([place])   . Essential hypertension   . Peripheral neuropathy (HCC)    Medications:  Scheduled:  . feeding supplement  1 Container Oral TID BM  . feeding supplement (GLUCERNA SHAKE)  237 mL Oral TID BM  . gabapentin  300 mg Oral QHS  . insulin aspart  0-15 Units Subcutaneous TID WC  . insulin glargine  7 Units Subcutaneous QHS  . latanoprost  1 drop Both Eyes QHS  . metoprolol tartrate  25 mg Oral BID  . peg 3350 powder  0.5 kit Oral Once  . polyvinyl alcohol  1 drop Both Eyes BID   Infusions:  . sodium chloride 50 mL/hr at 05/12/16 1400  . diltiazem (CARDIZEM) infusion 5 mg/hr (05/12/16 2013)  . heparin 900 Units/hr (05/12/16 1600)   Assessment: 42 yoM admitted with probable colon perforation and mass, in Afib, seen prev at [place]: for elevated HR - tried Diltiazem & Metoprolol,  anti-nausea meds. Hx CVA, on Apixaban 86m bid, LD 3/21 at 1700  Begin Heparin infusion for Afib, no bolus  aPTT ordered for baseline, use to measure Apixaban effect  Once aPTT and Heparin level correlate can monitor using only Heparin levels  3/25  AM labs: APTT is 83, therapeutic. HL still elevated due to apixaban  1300 level is 65, subtherapeutic  Hgb 10.9, plt 214  No bleeding issues reported Today, 3/26   0018 aptt=64, below goal, no infusion or bleeding issues per RN  Goal of Therapy:  APTT 66-102 sec Heparin level 0.3-0.7 units/ml Monitor platelets by anticoagulation protocol: Yes   Plan:   Increase heparin drip to 1000 units/hr  Plan is to hold heparin after 0400 today 6 hours prior to colonoscopy   f/u resume of anticoagulation after colonoscopy  GDorrene German3/26/2018 1:11 AM

## 2016-05-13 NOTE — Care Management Important Message (Addendum)
Important Message  Patient Details IM Letter given to Kathy/Case Manager to present to Patient Name: Benjamin Hurley MRN: [id number] Date of Birth: 1925/12/09   Medicare Important Message Given:  Yes    Kerin Salen 05/13/2016, 11:28 [place] Message  Patient Details  Name: Benjamin Hurley MRN: [id number] Date of Birth: 02-03-1926   Medicare Important Message Given:  Yes    Kerin Salen 05/13/2016, 11:28 AM

## 2016-05-13 NOTE — Transfer of Care (Signed)
Immediate Anesthesia Transfer of Care Note  Patient: Benjamin Hurley  Procedure(s) Performed: Procedure(s): COLONOSCOPY WITH PROPOFOL (N/A)  Patient Location: PACU  Anesthesia Type:MAC  Level of Consciousness: awake, alert , oriented and patient cooperative  Airway & Oxygen Therapy: Patient Spontanous Breathing and Patient connected to face mask oxygen  Post-op Assessment: Report given to RN, Post -op Vital signs reviewed and stable and Patient moving all extremities  Post vital signs: Reviewed and stable  Last Vitals:  Vitals:   05/13/16 0630 05/13/16 0918  BP: 113/69 (!) 161/65  Pulse: 65 88  Resp: 17 10  Temp: 36.8 C 36.6 C    Last Pain:  Vitals:   05/13/16 0918  TempSrc: Oral  PainSc:       Patients Stated Pain Goal: 3 (82/99/37 1696)  Complications: No apparent anesthesia complications

## 2016-05-13 NOTE — Op Note (Addendum)
[facility] Patient Name: Benjamin Hurley Procedure Date: 05/13/2016 MRN: [id number] Attending MD: Ladene Artist , MD Date of Birth: 10/23/1925 CSN: [id number] Age: 83 Admit Type: Inpatient Procedure:                Colonoscopy Indications:              Abdominal pain in the left upper quadrant, Abnormal                            CT of the GI tract Providers:                Pricilla Riffle. Fuller Plan, MD, Zenon Mayo, RN, William Dalton, Technician Referring MD:             Leighton Ruff. Redmond Pulling, MD Medicines:                Monitored Anesthesia Care Complications:            No immediate complications. Estimated blood loss:                            None. Estimated Blood Loss:     Estimated blood loss: none. Procedure:                Pre-Anesthesia Assessment:                           - Prior to the procedure, a History and Physical                            was performed, and patient medications and                            allergies were reviewed. The patient's tolerance of                            previous anesthesia was also reviewed. The risks                            and benefits of the procedure and the sedation                            options and risks were discussed with the patient.                            All questions were answered, and informed consent                            was obtained. Prior Anticoagulants: The patient has                            taken Eliquis (apixaban), last dose was 2 days  prior to procedure. IV heparin stopped 6 hours                            prior to the procedure. ASA Grade Assessment: III -                            A patient with severe systemic disease. After                            reviewing the risks and benefits, the patient was                            deemed in satisfactory condition to undergo the                            procedure.            After obtaining informed consent, the colonoscope                            was passed under direct vision. Throughout the                            procedure, the patient's blood pressure, pulse, and                            oxygen saturations were monitored continuously. The                            EC-[id number] ([id number]) scope was introduced through                            the anus and advanced to the the cecum, identified                            by appendiceal orifice and ileocecal valve. The                            ileocecal valve, appendiceal orifice, and rectum                            were photographed. The quality of the bowel                            preparation was adequate after extensive lavage an                            suctioning. The colonoscopy was performed without                            difficulty. The patient tolerated the procedure                            well. Scope In: 9:58:15 AM Scope Out: 10:26:32 AM Scope  Withdrawal Time: 0 hours 15 minutes 6 seconds  Total Procedure Duration: 0 hours 28 minutes 17 seconds  Findings:      The perianal and digital rectal examinations were normal.      A 8 mm polyp was found in the proximal transverse colon. The polyp was       sessile. The polyp was removed with a cold snare. Resection and       retrieval were complete.      Internal hemorrhoids were found during retroflexion. The hemorrhoids       were small and Grade I (internal hemorrhoids that do not prolapse).      Many large-mouthed diverticula were found in the sigmoid colon.      A moderate stenosis measuring 6 cm (in length) x 1.1 cm (inner diameter)       was found in the proximal descending colon and was traversed. A mass was       not visualized in the lumen-could be submucosal or extrinsic. Biopsies       were taken with a cold forceps for histology. Area was tattooed with       injections of Spot at 2 locations at the distal  aspect of the stricture.      The exam was otherwise without abnormality on direct and retroflexion       views. Impression:               - One 8 mm polyp in the proximal transverse colon,                            removed with a cold snare. Resected and retrieved.                           - Internal hemorrhoids.                           - Diverticulosis in the sigmoid colon.                           - Stricture in the proximal descending colon.                            Biopsied. Tattooed at distal margin.                           - The examination was otherwise normal on direct                            and retroflexion views. Moderate Sedation:      N/A- Per Anesthesia Care Recommendation:           - Possible repeat colonoscopy after pending                            pathology results are reviewed for surveillance,                            per Dr. Henrene Pastor.                           -  Resume IV heparin today at prior dose after 1600                            today. OK to resume Eliquis as indicated in 2 days.                            Refer to managing physician for further adjustment                            of therapy.                           - Patient has a contact number available for                            emergencies. The signs and symptoms of potential                            delayed complications were discussed with the                            patient. Return to normal activities tomorrow.                            Written discharge instructions were provided to the                            patient.                           - Clear liquid diet today, until seen by surgery.                           - Continue present medications.                           - Await pathology results.                           - Surgery contacted to re-evaluate. Procedure Code(s):        --- Professional ---                           [phone], Colonoscopy, flexible;  with removal of                            tumor(s), polyp(s), or other lesion(s) by snare                            technique                           45381, Colonoscopy, flexible; with directed                            submucosal injection(s), any substance  10932, 37, Colonoscopy, flexible; with biopsy,                            single or multiple Diagnosis Code(s):        --- Professional ---                           D12.3, Benign neoplasm of transverse colon (hepatic                            flexure or splenic flexure)                           K64.0, First degree hemorrhoids                           K56.69, Other intestinal obstruction                           R10.12, Left upper quadrant pain                           K57.30, Diverticulosis of large intestine without                            perforation or abscess without bleeding                           R93.3, Abnormal findings on diagnostic imaging of                            other parts of digestive tract CPT copyright 2016 American Medical Association. All rights reserved. The codes documented in this report are preliminary and upon coder review may  be revised to meet current compliance requirements. Ladene Artist, MD 05/13/2016 10:39:41 AM This report has been signed electronically. Number of Addenda: 0

## 2016-05-13 NOTE — Anesthesia Preprocedure Evaluation (Addendum)
Anesthesia Evaluation  Patient identified by MRN, date of birth, ID band Patient awake    Reviewed: Allergy & Precautions, NPO status , Patient's Chart, lab work & pertinent test results  Airway Mallampati: II  TM Distance: >3 FB Neck ROM: Full    Dental no notable dental hx. (+) Chipped   Pulmonary neg pulmonary ROS, former smoker,    Pulmonary exam normal breath sounds clear to auscultation       Cardiovascular hypertension, + Peripheral Vascular Disease  negative cardio ROS Normal cardiovascular exam+ dysrhythmias Atrial Fibrillation  Rhythm:Regular Rate:Normal     Neuro/Psych negative neurological ROS  negative psych ROS   GI/Hepatic negative GI ROS, Neg liver ROS,   Endo/Other  negative endocrine ROSdiabetes, Type 2, Insulin Dependent  Renal/GU negative Renal ROS  negative genitourinary   Musculoskeletal negative musculoskeletal ROS (+)   Abdominal   Peds negative pediatric ROS (+)  Hematology negative hematology ROS (+)   Anesthesia Other Findings   Reproductive/Obstetrics negative OB ROS                             Anesthesia Physical Anesthesia Plan  ASA: IV  Anesthesia Plan: MAC   Post-op Pain Management:    Induction: Intravenous  Airway Management Planned: Simple Face Mask  Additional Equipment:   Intra-op Plan:   Post-operative Plan:   Informed Consent: I have reviewed the patients History and Physical, chart, labs and discussed the procedure including the risks, benefits and alternatives for the proposed anesthesia with the patient or authorized representative who has indicated his/her understanding and acceptance.   Dental advisory given  Plan Discussed with: CRNA  Anesthesia Plan Comments:         Anesthesia Quick Evaluation

## 2016-05-13 NOTE — Care Management Note (Signed)
Case Management Note  Patient Details  Name: Benjamin Hurley MRN: [id number] Date of Birth: September 17, 1925  Subjective/Objective: 83 y/o m admitted w/Colon Ca, afib. From home.GI-flex sig done today. Surgery following for possible surgery. Cardio following-afib-iv heparin.palliative following-full code.                   Action/Plan:d/c plan home.   Expected Discharge Date:                  Expected Discharge Plan:  Home/Self Care  In-House Referral:     Discharge planning Services  CM Consult  Post Acute Care Choice:    Choice offered to:     DME Arranged:    DME Agency:     HH Arranged:    HH Agency:     Status of Service:  In process, will continue to follow  If discussed at Long Length of Stay Meetings, dates discussed:    Additional Comments:  Dessa Phi, RN 05/13/2016, 11:31 AM

## 2016-05-13 NOTE — Progress Notes (Signed)
  Echocardiogram 2D Echocardiogram has been performed.  Benjamin Hurley 05/13/2016, 4:10 PM

## 2016-05-13 NOTE — H&P (View-Only) (Signed)
Referring Provider: Dr. Redmond Pulling, CCS Primary Care Physician:  [place] Primary Gastroenterologist:  [place]  Reason for Consultation:  Colon mass  HPI: Benjamin Hurley is a 83 y.o. male with medical history significant of DM with peripheral neuropathy; HTN; carotid steonsis s/p CEA x 2; BPH; and afib on Eliquis presenting as a transfer from [facility] emergency room (patient's request to go elsewhere, ER called [facility]) with diagnosis of new-onset colon cancer.  His family reports that he remotely had back surgery - and had strokes before and after surgery.  About 2 years ago, he started with GI symptoms - midepigastric/LUQ abdominal pain/cramping, nausea.  C diff x 2 (remote and recently).  Has been nauseated 24/7, but family tells me no vomiting.  They tell me that he's had multiple tests and scans.  Has even had EGD's at the [place] but never had a colonoscopy.  They took him to [facility] on 3/22 and he had CT scan.  They told him it was perforation and then came back to report free air and a "big tumor" in his colon, that it was colon cancer.  He was transferred to [place].  Has been seen by surgery.  Apparently CT scan reported primary colon cancer in the proximal descending colon and infiltrating the splenic hilum and spleen.  This is resulting in a colon and distal small bowel obstruction.  There is a small amount of free air.  The mass is 6.2 x 4.8 cm.  Outside CT scan was reviewed by radiology and surgeons here and their assessment is the following:  He has colonic mass in the proximal left colon and splenic flexure that appears to be infiltrating the spleen. There is some mild fluid in this area but no free air or gross free air. There is no associated lymphadenopathy. He also has evidence of diverticulosis.  His Eliquis has been on hold since he has been here.  he is on heparin gtt.  Cardiology has seen him and ECHO is pending.   He is currently tolerating liquids in small amounts.  Says that he  can only drink a little at a time because he gets full.  Is sipping on some Miralax now.  Cdiff here was negative.  Daughter and wife were at bedside this AM and help provide history.   Past Medical History:  Diagnosis Date  . A-fib ([place])    on Eliquis  . BPH (benign prostatic hyperplasia)   . Carotid stenosis   . Diabetes ([place])   . Essential hypertension   . Peripheral neuropathy [facility])     Past Surgical History:  Procedure Laterality Date  . CAROTID ENDARTERECTOMY Bilateral   . lumber spine    . TRANSURETHRAL RESECTION OF PROSTATE      Prior to Admission medications   Medication Sig Start Date End Date Taking? Authorizing Provider  apixaban (ELIQUIS) 5 MG TABS tablet Take 5 mg by mouth 2 (two) times daily.   Yes Historical Provider, MD  carboxymethylcellulose (REFRESH PLUS) 0.5 % SOLN Place 1 drop into both eyes 2 (two) times daily.   Yes Historical Provider, MD  diltiazem (DILACOR XR) 120 MG 24 hr capsule Take 120 mg by mouth daily.   Yes Historical Provider, MD  feeding supplement, GLUCERNA SHAKE, (GLUCERNA SHAKE) LIQD Take 237 mLs by mouth daily.   Yes Historical Provider, MD  gabapentin (NEURONTIN) 300 MG capsule Take 300 mg by mouth at bedtime. 03/13/16  Yes Historical Provider, MD  insulin glargine (LANTUS) 100  UNIT/ML injection Inject 15-20 Units into the skin at bedtime. Patients wife states that she does a three day cycle. The first two days she gives the patient 20 units. Then on the third night she checks blood sugar. If blood sugar is under 100 she gives patient 15 units but if it is over 100 she gives the patient 20 units.   Yes Historical Provider, MD  latanoprost (XALATAN) 0.005 % ophthalmic solution Place 1 drop into both eyes at bedtime.   Yes Historical Provider, MD  metFORMIN (GLUCOPHAGE) 500 MG tablet Take 500 mg by mouth 2 (two) times daily with a meal.   Yes Historical Provider, MD  pantoprazole (PROTONIX) 40 MG tablet Take 40 mg by mouth daily.   Yes Historical  Provider, MD  promethazine (PHENERGAN) 25 MG tablet Take 12.5 mg by mouth every 4 (four) hours as needed for nausea or vomiting.   Yes Historical Provider, MD  simethicone (MYLICON) 80 MG chewable tablet Chew 80 mg by mouth every 6 (six) hours as needed for flatulence.   Yes Historical Provider, MD  sucralfate (CARAFATE) 1 GM/10ML suspension Take 1 g by mouth 2 (two) times daily.   Yes Historical Provider, MD    Current Facility-Administered Medications  Medication Dose Route Frequency Provider Last Rate Last Dose  . acetaminophen (TYLENOL) tablet 650 mg  650 mg Oral Q4H PRN Karmen Bongo, MD   650 mg at 05/10/16 2146  . dextrose 5 % solution   Intravenous Continuous Annita Brod, MD 50 mL/hr at 05/11/16 2352    . diltiazem (CARDIZEM) 100 mg in dextrose 5 % 100 mL (1 mg/mL) infusion  5 mg/hr Intravenous Continuous Annita Brod, MD 5 mL/hr at 05/11/16 2352 5 mg/hr at 05/11/16 2352  . feeding supplement (BOOST / RESOURCE BREEZE) liquid 1 Container  1 Container Oral TID BM Annita Brod, MD   1 Container at 05/11/16 2019  . feeding supplement (GLUCERNA SHAKE) (GLUCERNA SHAKE) liquid 237 mL  237 mL Oral TID BM Greer Pickerel, MD   237 mL at 05/11/16 2018  . furosemide (LASIX) injection 20 mg  20 mg Intravenous Once Arnoldo Lenis, MD      . gabapentin (NEURONTIN) capsule 300 mg  300 mg Oral QHS Karmen Bongo, MD   300 mg at 05/11/16 2150  . heparin ADULT infusion 100 units/mL (25000 units/237mL sodium chloride 0.45%)  850 Units/hr Intravenous Continuous Nikola Glogovac, RPH 8.5 mL/hr at 05/11/16 2352 850 Units/hr at 05/11/16 2352  . insulin aspart (novoLOG) injection 0-15 Units  0-15 Units Subcutaneous TID WC Karmen Bongo, MD   8 Units at 05/11/16 1802  . insulin glargine (LANTUS) injection 7 Units  7 Units Subcutaneous QHS Annita Brod, MD   7 Units at 05/11/16 2150  . latanoprost (XALATAN) 0.005 % ophthalmic solution 1 drop  1 drop Both Eyes QHS Karmen Bongo, MD   1 drop at  05/11/16 2151  . metoprolol tartrate (LOPRESSOR) tablet 25 mg  25 mg Oral BID Karmen Bongo, MD   25 mg at 05/11/16 2150  . morphine 4 MG/ML injection 2 mg  2 mg Intravenous Q2H PRN Karmen Bongo, MD      . ondansetron [facility]) injection 4 mg  4 mg Intravenous Q6H PRN Karmen Bongo, MD   4 mg at 05/09/16 2157  . polyethylene glycol (MIRALAX / GLYCOLAX) packet 17 g  17 g Oral TID Annita Brod, MD   Stopped at 05/12/16 [phone]  . polyvinyl alcohol (LIQUIFILM TEARS)  1.4 % ophthalmic solution 1 drop  1 drop Both Eyes BID Karmen Bongo, MD   1 drop at 05/11/16 2151    Allergies as of 05/09/2016  . (No Known Allergies)    History reviewed. No pertinent family history.  Social History   Social History  . Marital status: Married    Spouse name: N/A  . Number of children: N/A  . Years of education: N/A   Occupational History  . retired    Social History Main Topics  . Smoking status: Former Smoker    Types: Pipe    Quit date: 1968  . Smokeless tobacco: Former Systems developer    Types: Chew    Quit date: 1968  . Alcohol use No  . Drug use: No  . Sexual activity: Not on file   Other Topics Concern  . Not on file   Social History Narrative  . No narrative on file    Review of Systems: ROS is O/W negative except as mentioned in HPI.  Physical Exam: Vital signs in last 24 hours: Temp:  [97.8 F (36.6 C)-98.2 F (36.8 C)] 97.8 F (36.6 C) (03/25 0600) Pulse Rate:  [60-77] 74 (03/25 0600) Resp:  [16-20] 16 (03/25 0600) BP: (105-129)/(68-98) 110/98 (03/25 0600) SpO2:  [98 %-100 %] 100 % (03/25 0600) Last BM Date: 05/11/16 (per pt) General:  Alert, Well-developed, well-nourished, pleasant and cooperative in NAD Head:  Normocephalic and atraumatic. Eyes:  Sclera clear, no icterus.  Conjunctiva pink. Ears:  Normal auditory acuity. Mouth:  No deformity or lesions.   Lungs:  Clear throughout to auscultation.  No wheezes, crackles, or rhonchi.  Heart:  Irregular.  No  M/R/G. Abdomen:  Soft,nontender, BS active,nonpalp mass or hsm.   Rectal:  Deferred  Msk:  Symmetrical without gross deformities. Pulses:  Normal pulses noted. Extremities:  Without clubbing or edema. Neurologic:  Alert and oriented x 4;  grossly normal neurologically. Skin:  Intact without significant lesions or rashes. Psych:  Alert and cooperative. Normal mood and affect.  Intake/Output from previous day: 03/24 0701 - 03/25 0700 In: 1268.3 [P.O.:120; I.V.:1148.3] Out: -   Lab Results:  Recent Labs  05/10/16 0500 05/11/16 0433 05/12/16 0508  WBC 5.6 4.5 6.4  HGB 12.5* 10.9* 10.9*  HCT 37.1* 32.2* 31.9*  PLT 293 210 214   BMET  Recent Labs  05/10/16 0500 05/10/16 1200  NA 134* 130*  K 4.8 4.5  CL 104 101  CO2 22 21*  GLUCOSE 85 226*  BUN 18 18  CREATININE 1.01 1.07  CALCIUM 8.8* 8.5*   LFT  Recent Labs  05/10/16 1200  PROT 6.8  ALBUMIN 3.4*  AST 31  ALT 14*  ALKPHOS 68  BILITOT 0.7   IMPRESSION:  -Colonic mass in the proximal left colon and splenic flexure that appears to be infiltrating the spleen. There is some mild fluid in this area but no free air or gross free air. -Atrial fibrillation:  On Eliquis at home but on heparin gtt here.  ECHO pending per cardiology.  PLAN: -Tentatively planning for colonoscopy/flex sig on 3/26 with Dr. Fuller Plan on 10 AM. -Heparin gtt on hold starting at 0400. -Check abdominal films today. -Drinking Miralax currently and will give split dose moviprep starting later this evening as well.  ZEHR, JESSICA D.  05/12/2016, 9:05 AM  Pager number [phone]  GI ATTENDING  History, laboratories, x-ray report reviewed. Surgical consultation note reviewed. Patient personally seen and examined. Family in room. Elderly gentleman presents with nausea  vomiting and left upper quadrant pain. Found to have probable left-sided colon mass with infiltrative component. Tolerated several doses of MiraLAX yesterday and today with several bowel  movements. We will obtain repeat plain films of the abdomen today. Plan colonoscopy tomorrow around 10 AM with Dr. Fuller Plan. The patient is high-risk given his age and comorbidities.The nature of the procedure, as well as the risks, benefits, and alternatives were carefully and thoroughly reviewed with the patient. Ample time for discussion and questions allowed. The patient understood, was satisfied, and agreed to proceed.. Heparin being held 6 hours prior. Cardiology workup underway. Cardiology note reviewed. Echo plans noted.  Docia Chuck. Geri Seminole., M.D. [facility] Division of Gastroenterology

## 2016-05-14 ENCOUNTER — Encounter (HOSPITAL_COMMUNITY): Payer: Self-pay | Admitting: Gastroenterology

## 2016-05-14 DIAGNOSIS — K639 Disease of intestine, unspecified: Secondary | ICD-10-CM

## 2016-05-14 DIAGNOSIS — I5022 Chronic systolic (congestive) heart failure: Secondary | ICD-10-CM

## 2016-05-14 DIAGNOSIS — I420 Dilated cardiomyopathy: Secondary | ICD-10-CM

## 2016-05-14 LAB — CBC
HCT: 32.8 % — ABNORMAL LOW (ref 39.0–52.0)
Hemoglobin: 10.8 g/dL — ABNORMAL LOW (ref 13.0–17.0)
MCH: 30.6 pg (ref 26.0–34.0)
MCHC: 32.9 g/dL (ref 30.0–36.0)
MCV: 92.9 fL (ref 78.0–100.0)
PLATELETS: 185 10*3/uL (ref 150–400)
RBC: 3.53 MIL/uL — ABNORMAL LOW (ref 4.22–5.81)
RDW: 15 % (ref 11.5–15.5)
WBC: 6 10*3/uL (ref 4.0–10.5)

## 2016-05-14 LAB — GLUCOSE, CAPILLARY
GLUCOSE-CAPILLARY: 147 mg/dL — AB (ref 65–99)
GLUCOSE-CAPILLARY: 162 mg/dL — AB (ref 65–99)
Glucose-Capillary: 129 mg/dL — ABNORMAL HIGH (ref 65–99)
Glucose-Capillary: 124 mg/dL — ABNORMAL HIGH (ref 65–99)

## 2016-05-14 LAB — APTT: APTT: 90 s — AB (ref 24–36)

## 2016-05-14 LAB — HEPARIN LEVEL (UNFRACTIONATED)
HEPARIN UNFRACTIONATED: 0.37 [IU]/mL (ref 0.30–0.70)
Heparin Unfractionated: 0.44 IU/mL (ref 0.30–0.70)

## 2016-05-14 MED ORDER — ZOLPIDEM TARTRATE 5 MG PO TABS
5.0000 mg | ORAL_TABLET | Freq: Every evening | ORAL | Status: DC | PRN
  Administered 2016-05-14: 5 mg via ORAL
  Filled 2016-05-14: qty 1

## 2016-05-14 MED ORDER — FUROSEMIDE 10 MG/ML IJ SOLN
20.0000 mg | Freq: Once | INTRAMUSCULAR | Status: AC
  Administered 2016-05-14: 20 mg via INTRAVENOUS
  Filled 2016-05-14: qty 2

## 2016-05-14 NOTE — Progress Notes (Signed)
PROGRESS NOTE  Benjamin Hurley WFU:[id number] DOB: [birth year] DOA: 05/09/2016 PCP: No PCP Per Patient  HPI/Recap of past 22 hours: 83 year old male past medical history of atrial fibrillation on chronic anticoagulation and diabetes mellitus who said several hospitalizations at the [place] for C. difficile including one 2 weeks ago was evaluated at [facility] for several days of severe nausea and vomiting and very loose stools and reportedly had a chest x-ray that showed questionable free air. Patient then underwent a CT scan which did not note any type of bowel perforation, but did note a mass extending from the descending colon into inferior splenic hilum and with spleen involvement concerning for cancer. Hospitalists were contacted and patient was transferred over to [facility] long hospital.   Stools tested on admission negative for C. difficile.  Seen by general surgery and cardiology. Patient underwent flexible sigmoidoscopy 3/26 which noted some stricture and 1 benign polyp, but no visible mass. GI felt findings could be consistent with colon cancer within the colonic wall versus extrinsic compression. Echocardiogram done by cardiology noted significant decrease of ejection fraction of 25-30 percent however his risk for surgery remains the same regardless of ejection fraction and patient started on beta blocker for risk reduction. Awaiting surgery plan.  Patient himself going okay. No complaints.  Assessment/Plan: Principal Problem:  Suspected Colon cancer ([place]) with colonic mass causing partial obstruction extending into spleen: Discussed with general surgery and the patient is to be a surgery candidate, he would require a partial colectomy with splenectomy. CEA level somewhat elevated at 23. Nutritional supplementation with boost breeze, patient is not protein calorie malnourished. Cardiology on board for surgical clearance. He does remain high risk, but has been cleared from cardiac  standpoint Seen by palliative care and patient would like everything done. GI flexible sigmoidoscopy on revealing. Pathology negative. Awaiting surgical plan.  Active Problems:  Chronic Atrial fibrillation with RVR [facility]): Chads 2 score of 7. Eliquis on hold and patient on IV heparin. Tolerating Cardizem drip. Heart rate under control.   Chronic systolic heart failure: Noted ejection fraction 25-30 percent. Significant weakness since echo 3 years ago. Nevertheless, this does not preclude surgery. Cardiology recommends outpatient follow-up for evaluation of systolic disease later on. In the meantime, watch strict input and output.    Hypomagnesemia: Replacing   Diabetes mellitus, type 2 ([place]): With decreased by mouth, patient had hypoglycemia this morning. Checking A1c. Have decrease Lantus from 20 units down to 7. Noted CBG jump this morning to 315.  We'll turn off dextrose in IV fluids. CBGs have been much better controlled.   Code Status: Full code   Family Communication: Wife and daughter at the bedside   Disposition Plan: Depending on outcome of flex sig and potential surgery   Consultants: General surgery Cardiology Palliative care Gastroenterology   Procedures:  None   Antimicrobials:  None   DVT prophylaxis: IV heparin  Objective: Vitals:   05/13/16 1536 05/13/16 2220 05/14/16 0444 05/14/16 1455  BP: 131/69 113/66 134/76 122/86  Pulse: 64 76 78 76  Resp: 16 16 16 19   Temp: 98.7 F (37.1 C) 97.6 F (36.4 C) 97.8 F (36.6 C) 98.2 F (36.8 C)  TempSrc: Oral Oral Oral Oral  SpO2: 99% 99% 100% 100%  Weight:   71.2 kg (157 lb)   Height:        Intake/Output Summary (Last 24 hours) at 05/14/16 1715 Last data filed at 05/14/16 1455  Gross per 24 hour  Intake  2428.33 ml  Output                0 ml  Net          2428.33 ml   Filed Weights   05/11/16 0625 05/13/16 0630 05/14/16 0444  Weight: 70.2 kg (154 lb 12.2 oz) 74.5 kg (164 lb 3.9 oz) 71.2 kg (157  lb)    Exam:   General:  Alert and oriented 2, no acute distress   Cardiovascular: Irregular rhythm, Rate controlled   Respiratory: Clear to Auscultation bilaterally  Abdomen:  soft, mild distention, Minimal tender in left upper quadrant, hypoactive bowel sounds   Musculoskeletal: no clubbing or cyanosis, trace pitting edema   Skin: no skin breaks, tears or lesions  Psychiatry: patient is appropriate, no evidence of psychoses    Data Reviewed: CBC:  Recent Labs Lab 05/10/16 0500 05/11/16 0433 05/12/16 0508 05/13/16 0456 05/14/16 0513  WBC 5.6 4.5 6.4 6.9 6.0  HGB 12.5* 10.9* 10.9* 12.9* 10.8*  HCT 37.1* 32.2* 31.9* 39.4 32.8*  MCV 90.7 91.0 90.6 90.6 92.9  PLT 293 210 214 235 381   Basic Metabolic Panel:  Recent Labs Lab 05/10/16 0500 05/10/16 1200  NA 134* 130*  K 4.8 4.5  CL 104 101  CO2 22 21*  GLUCOSE 85 226*  BUN 18 18  CREATININE 1.01 1.07  CALCIUM 8.8* 8.5*  MG 1.9  --    GFR: Estimated Creatinine Clearance: 43.1 mL/min (by C-G formula based on SCr of 1.07 mg/dL). Liver Function Tests:  Recent Labs Lab 05/10/16 1200  AST 31  ALT 14*  ALKPHOS 68  BILITOT 0.7  PROT 6.8  ALBUMIN 3.4*   No results for input(s): LIPASE, AMYLASE in the last 168 hours. No results for input(s): AMMONIA in the last 168 hours. Coagulation Profile: No results for input(s): INR, PROTIME in the last 168 hours. Cardiac Enzymes:  Recent Labs Lab 05/09/16 2057 05/10/16 0500 05/10/16 1200  TROPONINI <0.03 <0.03 <0.03   BNP (last 3 results) No results for input(s): PROBNP in the last 8760 hours. HbA1C: No results for input(s): HGBA1C in the last 72 hours. CBG:  Recent Labs Lab 05/13/16 1639 05/13/16 2222 05/14/16 0803 05/14/16 1148 05/14/16 1627  GLUCAP 186* 187* 147* 162* 129*   Lipid Profile: No results for input(s): CHOL, HDL, LDLCALC, TRIG, CHOLHDL, LDLDIRECT in the last 72 hours. Thyroid Function Tests: No results for input(s): TSH, T4TOTAL,  FREET4, T3FREE, THYROIDAB in the last 72 hours. Anemia Panel: No results for input(s): VITAMINB12, FOLATE, FERRITIN, TIBC, IRON, RETICCTPCT in the last 72 hours. Urine analysis: No results found for: COLORURINE, APPEARANCEUR, LABSPEC, PHURINE, GLUCOSEU, HGBUR, BILIRUBINUR, KETONESUR, PROTEINUR, UROBILINOGEN, NITRITE, LEUKOCYTESUR Sepsis Labs: @LABRCNTIP (procalcitonin:4,lacticidven:4)  ) Recent Results (from the past 240 hour(s))  Gastrointestinal Panel by PCR , Stool     Status: None   Collection Time: 05/10/16  3:07 AM  Result Value Ref Range Status   Campylobacter species NOT DETECTED NOT DETECTED Final   Plesimonas shigelloides NOT DETECTED NOT DETECTED Final   Salmonella species NOT DETECTED NOT DETECTED Final   Yersinia enterocolitica NOT DETECTED NOT DETECTED Final   Vibrio species NOT DETECTED NOT DETECTED Final   Vibrio cholerae NOT DETECTED NOT DETECTED Final   Enteroaggregative E coli (EAEC) NOT DETECTED NOT DETECTED Final   Enteropathogenic E coli (EPEC) NOT DETECTED NOT DETECTED Final   Enterotoxigenic E coli (ETEC) NOT DETECTED NOT DETECTED Final   Shiga like toxin producing E coli (STEC) NOT DETECTED NOT DETECTED  Final   Shigella/Enteroinvasive E coli (EIEC) NOT DETECTED NOT DETECTED Final   Cryptosporidium NOT DETECTED NOT DETECTED Final   Cyclospora cayetanensis NOT DETECTED NOT DETECTED Final   Entamoeba histolytica NOT DETECTED NOT DETECTED Final   Giardia lamblia NOT DETECTED NOT DETECTED Final   Adenovirus F40/41 NOT DETECTED NOT DETECTED Final   Astrovirus NOT DETECTED NOT DETECTED Final   Norovirus GI/GII NOT DETECTED NOT DETECTED Final   Rotavirus A NOT DETECTED NOT DETECTED Final   Sapovirus (I, II, IV, and V) NOT DETECTED NOT DETECTED Final  C difficile quick scan w PCR reflex     Status: None   Collection Time: 05/10/16  3:07 AM  Result Value Ref Range Status   C Diff antigen NEGATIVE NEGATIVE Final   C Diff toxin NEGATIVE NEGATIVE Final   C Diff  interpretation No C. difficile detected.  Final      Studies: No results found.  Scheduled Meds: . feeding supplement  1 Container Oral TID BM  . feeding supplement (GLUCERNA SHAKE)  237 mL Oral TID BM  . gabapentin  300 mg Oral QHS  . insulin aspart  0-15 Units Subcutaneous TID WC  . insulin glargine  7 Units Subcutaneous QHS  . latanoprost  1 drop Both Eyes QHS  . metoprolol tartrate  25 mg Oral BID  . polyvinyl alcohol  1 drop Both Eyes BID    Continuous Infusions: . sodium chloride 50 mL/hr at 05/14/16 1034  . diltiazem (CARDIZEM) infusion 5 mg/hr (05/14/16 0854)  . heparin 1,000 Units/hr (05/13/16 1612)     LOS: 5 days    Annita Brod, MD [facility] Pager [phone]  If 7PM-7AM, please contact night-coverage [email or web address] Password [facility] 05/14/2016, 5:15 PM

## 2016-05-14 NOTE — Consult Note (Signed)
   [facility] Inpatient Consult   05/14/2016  Benjamin Hurley Mar 30, 1925 [id number]    [facility] Care Management follow up. Chart reviewed. Noted possible surgery is pending. Will continue to follow and engage for potential [facility] Care Management services when appropriate. Made inpatient RNCM aware.   Marthenia Rolling, MSN-Ed, RN,BSN [facility] Liaison [phone]

## 2016-05-14 NOTE — Progress Notes (Signed)
     [employer] [facility] Progress Note  Chief Complaint:   Abdominal pain, nausea and abnormal CTscan  Subjective: Feels okay today. Having liquids right now for lunch. Talkative  Objective:  Vital signs in last 24 hours: Temp:  [97.6 F (36.4 C)-98.7 F (37.1 C)] 97.8 F (36.6 C) (03/27 0444) Pulse Rate:  [64-88] 78 (03/27 0444) Resp:  [10-16] 16 (03/27 0444) BP: (82-161)/(37-76) 134/76 (03/27 0444) SpO2:  [98 %-100 %] 100 % (03/27 0444) Weight:  [157 lb (71.2 kg)] 157 lb (71.2 kg) (03/27 0444) Last BM Date: 05/13/16 General:   Alert, well-developed, white male in NAD EENT:  Normal hearing, non icteric sclera, conjunctive pink.  Heart:  Regular rate, irreg rhythm, no lower extremity edema Pulm: Normal respiratory effort, lungs Abdomen:  Soft, nondistended, nontender. High pitched tinkle sounds. .    Neurologic:  Alert and  oriented x4;  grossly normal neurologically. Psych:  Alert and cooperative. Normal mood and affect.   Intake/Output from previous day: 03/26 0701 - 03/27 0700 In: 2053 [P.O.:320; I.V.:1733] Out: -  Intake/Output this shift: No intake/output data recorded.  Lab Results:  Recent Labs  05/12/16 0508 05/13/16 0456  WBC 6.4 6.9  HGB 10.9* 12.9*  HCT 31.9* 39.4  PLT 214 235   Dg Abd 2 Views  Result Date: 05/12/2016 CLINICAL DATA:  Colonic mass, abdominal pain EXAM: ABDOMEN - 2 VIEW COMPARISON:  None. FINDINGS: There is mild gaseous distension of small bowel in left mid abdomen suspicious for ileus. Moderate distension of the right colon and proximal transverse colon with gas suspicious for ileus or early colonic obstruction. Nonspecific air-fluid levels are noted within colon. No evidence of free abdominal air. IMPRESSION: There is mild gaseous distension of small bowel in left mid abdomen suspicious for ileus. Moderate distension of the right colon and proximal transverse colon with gas suspicious for ileus or early colonic obstruction.  Nonspecific air-fluid levels are noted within colon. No evidence of free abdominal air. Electronically Signed   By: Lahoma Crocker M.D.   On: 05/12/2016 15:55    Assessment / Plan:   83 yo male with abdominal pain / nausea and abnormal CTscan suggesting left colon and splenic flexure mass infiltrating spleen. Patient underwent colonoscopy yesterday with findings of an 9 mm prox transverse polyp, left sided diverticular disease, and  moderate stenosis in proximal descending colon.  No mass in lumen was seen. Polyp path c/w tubular adenoma. Colon biopsies at site of stenosis were benign.  Surgery is evaluating.    . Principal Problem:   Colon cancer ([place]) Active Problems:   Atrial fibrillation with RVR ([place])   Hypomagnesemia   Diabetes mellitus, type 2 ([place])   Encounter for palliative care   Goals of care, counseling/discussion   Nausea without vomiting   Abnormal CT scan, colon   LUQ abdominal pain   Benign neoplasm of transverse colon   Stricture of colon   Colonic mass   LOS: 5 days   Tye Savoy NP 05/14/2016, 9:12 AM  Pager number [phone]    Attending physician's note   I have taken an interval history, reviewed the chart and examined the patient. I agree with the Advanced Practitioner's note, impression and recommendations. Colon biopsies of desc colon stricture were normal which is consistent with the appearance at colonoscopy. The proximal colon polyp was a tubular adenoma. Consider repeat CT, laparotomy. Surgery evaluating.   Lucio Edward, MD [facility] [phone] Mon-Fri 8a-5p [phone] after 5p, weekends, holidays

## 2016-05-14 NOTE — Progress Notes (Signed)
ANTICOAGULATION CONSULT NOTE   Pharmacy Consult for Heparin Indication: atrial fibrillation  No Known Allergies  Patient Measurements: Height: 5\' 6"  (167.6 cm) Weight: 157 lb (71.2 kg) IBW/kg (Calculated) : 63.8 Heparin Dosing Weight: 71.9  Vital Signs: Temp: 97.8 F (36.6 C) (03/27 0444) Temp Source: Oral (03/27 0444) BP: 134/76 (03/27 0444) Pulse Rate: 78 (03/27 0444)  Labs:  Recent Labs  05/12/16 0508  05/13/16 0018 05/13/16 0456 05/13/16 2349 05/14/16 0513 05/14/16 0926  HGB 10.9*  --   --  12.9*  --  10.8*  --   HCT 31.9*  --   --  39.4  --  32.8*  --   PLT 214  --   --  235  --  185  --   APTT 83*  < > 64* 44* 90*  --   --   HEPARINUNFRC 1.22*  --   --  0.69 0.44  --  0.37  < > = values in this interval not displayed.  Estimated Creatinine Clearance: 43.1 mL/min (by C-G formula based on SCr of 1.07 mg/dL).  Medical History: Past Medical History:  Diagnosis Date  . A-fib ([place])    on Eliquis  . BPH (benign prostatic hyperplasia)   . Carotid stenosis   . Diabetes ([place])   . Essential hypertension   . Peripheral neuropathy ([facility])    Medications:  Scheduled:  . feeding supplement  1 Container Oral TID BM  . feeding supplement (GLUCERNA SHAKE)  237 mL Oral TID BM  . gabapentin  300 mg Oral QHS  . insulin aspart  0-15 Units Subcutaneous TID WC  . insulin glargine  7 Units Subcutaneous QHS  . latanoprost  1 drop Both Eyes QHS  . metoprolol tartrate  25 mg Oral BID  . polyvinyl alcohol  1 drop Both Eyes BID   Infusions:  . sodium chloride 50 mL/hr at 05/14/16 1034  . diltiazem (CARDIZEM) infusion 5 mg/hr (05/14/16 0854)  . heparin 1,000 Units/hr (05/13/16 1612)   Assessment: Benjamin Hurley admitted with probable colon perforation and mass, in Afib, seen prev at [place]: for elevated HR - tried Diltiazem & Metoprolol, anti-nausea meds. Hx CVA, on Apixaban 5mg  bid, LD 3/21 at 1700   05/14/2016:   aPTT and Heparin levels correlating 3/27 AM so monitoring using only  Heparin levels going forward  Heparin level this morning remains therapeutic at 0.37 with infusion at 1000 units/hr  CBC- Hgb and platelets decreased from yesterday. S/p colonoscopy yesterday with polyp removed.  No bleeding issues reported.  Goal of Therapy:  Heparin level 0.3-0.7 units/ml Monitor platelets by anticoagulation protocol: Yes  Plan:   Continue heparin infusion at 1000 units/hr  Daily heparin level and CBC while on heparin infusion  F/U surgery plans, OK to resume Apixaban 48hrs following colonoscopy from GI standpoint.   Hershal Coria, PharmD, BCPS Pager: [phone] 05/14/2016 11:10 AM

## 2016-05-14 NOTE — Progress Notes (Signed)
ANTICOAGULATION CONSULT NOTE - Follow Up Consult  Pharmacy Consult for Heparin Indication: atrial fibrillation  No Known Allergies  Patient Measurements: Height: 5\' 6"  (167.6 cm) Weight: 164 lb 3.9 oz (74.5 kg) IBW/kg (Calculated) : 63.8 Heparin Dosing Weight:   Vital Signs: Temp: 97.6 F (36.4 C) (03/26 2220) Temp Source: Oral (03/26 2220) BP: 113/66 (03/26 2220) Pulse Rate: 76 (03/26 2220)  Labs:  Recent Labs  05/11/16 0433  05/12/16 0508  05/13/16 0018 05/13/16 0456 05/13/16 2349  HGB 10.9*  --  10.9*  --   --  12.9*  --   HCT 32.2*  --  31.9*  --   --  39.4  --   PLT 210  --  214  --   --  235  --   APTT 158*  < > 83*  < > 64* 44* 90*  HEPARINUNFRC 2.14*  --  1.22*  --   --  0.69 0.44  < > = values in this interval not displayed.  Estimated Creatinine Clearance: 43.1 mL/min (by C-G formula based on SCr of 1.07 mg/dL).   Medications:  Infusions:  . sodium chloride 50 mL/hr at 05/13/16 1440  . diltiazem (CARDIZEM) infusion 5 mg/hr (05/13/16 1805)  . heparin 1,000 Units/hr (05/13/16 1612)    Assessment: Patient with PTT and Heparin level both at goal.  PTT ordered with Heparin level until both correlate due to possible drug-lab interaction between oral anticoagulant (rivaroxaban, edoxaban, or apixaban) and anti-Xa level (aka heparin level) While both were at goal, will only use heparin level going forward.  Goal of Therapy:  Heparin level 0.3-0.7 units/ml Monitor platelets by anticoagulation protocol: Yes   Plan:  Continue heparin drip at current rate Recheck level at Home, [place] 05/14/2016,4:09 AM

## 2016-05-14 NOTE — Progress Notes (Signed)
Patient ID: Benjamin Hurley, male   DOB: 1925-08-06, 83 y.o.   MRN: [id number]  [facility] Surgery Progress Note  1 Day Post-Op  Subjective: Sitting up in chair. No complaints today. Denies abdominal pain, nausea, or vomiting. Tolerating clear liquids. Passing flatus and had loose BM this AM.  Objective: Vital signs in last 24 hours: Temp:  [97.6 F (36.4 C)-98.7 F (37.1 C)] 97.8 F (36.6 C) (03/27 0444) Pulse Rate:  [64-87] 78 (03/27 0444) Resp:  [15-16] 16 (03/27 0444) BP: (113-134)/(62-76) 134/76 (03/27 0444) SpO2:  [98 %-100 %] 100 % (03/27 0444) Weight:  [157 lb (71.2 kg)] 157 lb (71.2 kg) (03/27 0444) Last BM Date: 05/13/16  Intake/Output from previous day: 03/26 0701 - 03/27 0700 In: 2053 [P.O.:320; I.V.:1733] Out: -  Intake/Output this shift: No intake/output data recorded.  PE: Gen:  Alert, NAD, pleasant Pulm:  Effort normal Abd: Soft, NT/ND, +BS, no HSM Ext:  No erythema, edema, or tenderness   Lab Results:   Recent Labs  05/12/16 0508 05/13/16 0456  WBC 6.4 6.9  HGB 10.9* 12.9*  HCT 31.9* 39.4  PLT 214 235   BMET No results for input(s): NA, K, CL, CO2, GLUCOSE, BUN, CREATININE, CALCIUM in the last 72 hours. PT/INR No results for input(s): LABPROT, INR in the last 72 hours. CMP     Component Value Date/Time   NA 130 (L) 05/10/2016 1200   K 4.5 05/10/2016 1200   CL 101 05/10/2016 1200   CO2 21 (L) 05/10/2016 1200   GLUCOSE 226 (H) 05/10/2016 1200   BUN 18 05/10/2016 1200   CREATININE 1.07 05/10/2016 1200   CALCIUM 8.5 (L) 05/10/2016 1200   PROT 6.8 05/10/2016 1200   ALBUMIN 3.4 (L) 05/10/2016 1200   AST 31 05/10/2016 1200   ALT 14 (L) 05/10/2016 1200   ALKPHOS 68 05/10/2016 1200   BILITOT 0.7 05/10/2016 1200   GFRNONAA 60 (L) 05/10/2016 1200   GFRAA >60 05/10/2016 1200   Lipase  No results found for: LIPASE     Studies/Results: Dg Abd 2 Views  Result Date: 05/12/2016 CLINICAL DATA:  Colonic mass, abdominal pain EXAM:  ABDOMEN - 2 VIEW COMPARISON:  None. FINDINGS: There is mild gaseous distension of small bowel in left mid abdomen suspicious for ileus. Moderate distension of the right colon and proximal transverse colon with gas suspicious for ileus or early colonic obstruction. Nonspecific air-fluid levels are noted within colon. No evidence of free abdominal air. IMPRESSION: There is mild gaseous distension of small bowel in left mid abdomen suspicious for ileus. Moderate distension of the right colon and proximal transverse colon with gas suspicious for ileus or early colonic obstruction. Nonspecific air-fluid levels are noted within colon. No evidence of free abdominal air. Electronically Signed   By: Lahoma Crocker M.D.   On: 05/12/2016 15:55    Anti-infectives: Anti-infectives    None       Assessment/Plan Left colon and splenic flexure mass infiltrating spleen - colonoscopy 3/26 showed 8 mm proximal transverse polyp removed, Diverticulosis in the sigmoid colon, and stricture in the proximal descending colon that was biopsied and tattooed at distal margin - Polyp path c/w tubular adenoma. Colon biopsies at site of stenosis were benign  Chronic atrial fibrillation - eliquis on hold.  Acute systolic HF - ECHO 4/09 EF 30%-35%, awaiting cardiology recs regarding preop clearance HTN DM Dementia  ID - none FEN - CLD VTE - heparin  Plan - Biopsies from colonoscopy benign. Will review plan  with MD. Madaline Brilliant to continue clear liquid diet for now, NPO after midnight. Awaiting cardiology recommendations regarding preop clearance.    LOS: 5 days    Jerrye Beavers , [facility] Surgery 05/14/2016, 10:54 AM Pager: [phone] Consults: [phone] Mon-Fri 7:00 am-4:30 pm Sat-Sun 7:00 am-11:30 am

## 2016-05-14 NOTE — Progress Notes (Signed)
Progress Note  Patient Name: Benjamin Hurley Date of Encounter: 05/14/2016  Primary Cardiologist: Harl Bowie  Subjective   Feeling well today - no CP or SOB   Inpatient Medications    Scheduled Meds: . feeding supplement  1 Container Oral TID BM  . feeding supplement (GLUCERNA SHAKE)  237 mL Oral TID BM  . gabapentin  300 mg Oral QHS  . insulin aspart  0-15 Units Subcutaneous TID WC  . insulin glargine  7 Units Subcutaneous QHS  . latanoprost  1 drop Both Eyes QHS  . metoprolol tartrate  25 mg Oral BID  . polyvinyl alcohol  1 drop Both Eyes BID   Continuous Infusions: . sodium chloride 50 mL/hr at 05/13/16 1440  . diltiazem (CARDIZEM) infusion 5 mg/hr (05/13/16 1805)  . heparin 1,000 Units/hr (05/13/16 1612)   PRN Meds: acetaminophen, alum & mag hydroxide-simeth, morphine injection, ondansetron (ZOFRAN) IV   Vital Signs    Vitals:   05/13/16 1107 05/13/16 1536 05/13/16 2220 05/14/16 0444  BP: 114/62 131/69 113/66 134/76  Pulse: 87 64 76 78  Resp: 15 16 16 16   Temp: 97.6 F (36.4 C) 98.7 F (37.1 C) 97.6 F (36.4 C) 97.8 F (36.6 C)  TempSrc: Oral Oral Oral Oral  SpO2: 98% 99% 99% 100%  Weight:    157 lb (71.2 kg)  Height:        Intake/Output Summary (Last 24 hours) at 05/14/16 0946 Last data filed at 05/14/16 0200  Gross per 24 hour  Intake             2053 ml  Output                0 ml  Net             2053 ml   Filed Weights   05/11/16 0625 05/13/16 0630 05/14/16 0444  Weight: 154 lb 12.2 oz (70.2 kg) 164 lb 3.9 oz (74.5 kg) 157 lb (71.2 kg)    Telemetry    Afib (controlled) - Personally Reviewed  ECG    N/A - Personally Reviewed  Physical Exam   General: Well developed, well nourished, male appearing in no acute distress. Head: Normocephalic, atraumatic.  Neck: Supple without bruits, JVD. Lungs:  Resp regular and unlabored, CTA. Heart: Irreg Irreg, S1, S2, no S3, S4, or murmur; no rub. Abdomen: Soft, non-tender, non-distended with  normoactive bowel sounds. No hepatomegaly. No rebound/guarding. No obvious abdominal masses. Extremities: No clubbing, cyanosis, 1+ bilateral LE edema. Distal pedal pulses are 2+ bilaterally. Neuro: Alert and oriented X 3. Moves all extremities spontaneously. Psych: Normal affect.  Labs    Chemistry Recent Labs Lab 05/10/16 0500 05/10/16 1200  NA 134* 130*  K 4.8 4.5  CL 104 101  CO2 22 21*  GLUCOSE 85 226*  BUN 18 18  CREATININE 1.01 1.07  CALCIUM 8.8* 8.5*  PROT  --  6.8  ALBUMIN  --  3.4*  AST  --  31  ALT  --  14*  ALKPHOS  --  68  BILITOT  --  0.7  GFRNONAA >60 60*  GFRAA >60 >60  ANIONGAP 8 8     Hematology Recent Labs Lab 05/11/16 0433 05/12/16 0508 05/13/16 0456  WBC 4.5 6.4 6.9  RBC 3.54* 3.52* 4.35  HGB 10.9* 10.9* 12.9*  HCT 32.2* 31.9* 39.4  MCV 91.0 90.6 90.6  MCH 30.8 31.0 29.7  MCHC 33.9 34.2 32.7  RDW 14.5 14.5 14.3  PLT 210 214  235    Cardiac Enzymes Recent Labs Lab 05/09/16 2057 05/10/16 0500 05/10/16 1200  TROPONINI <0.03 <0.03 <0.03   No results for input(s): TROPIPOC in the last 168 hours.   BNPNo results for input(s): BNP, PROBNP in the last 168 hours.   DDimer No results for input(s): DDIMER in the last 168 hours.    Radiology    Dg Abd 2 Views  Result Date: 05/12/2016 CLINICAL DATA:  Colonic mass, abdominal pain EXAM: ABDOMEN - 2 VIEW COMPARISON:  None. FINDINGS: There is mild gaseous distension of small bowel in left mid abdomen suspicious for ileus. Moderate distension of the right colon and proximal transverse colon with gas suspicious for ileus or early colonic obstruction. Nonspecific air-fluid levels are noted within colon. No evidence of free abdominal air. IMPRESSION: There is mild gaseous distension of small bowel in left mid abdomen suspicious for ileus. Moderate distension of the right colon and proximal transverse colon with gas suspicious for ileus or early colonic obstruction. Nonspecific air-fluid levels are  noted within colon. No evidence of free abdominal air. Electronically Signed   By: Lahoma Crocker M.D.   On: 05/12/2016 15:55    Cardiac Studies   TTE: 05/13/16  Study Conclusions  - Left ventricle: The cavity size was normal. Systolic function was   moderately to severely reduced. The estimated ejection fraction   was in the range of 30% to 35%. Diffuse hypokinesis. - Aortic valve: Transvalvular velocity was within the normal range.   There was no stenosis. There was no regurgitation. - Mitral valve: Transvalvular velocity was within the normal range.   There was no evidence for stenosis. There was trivial   regurgitation. - Left atrium: The atrium was mildly dilated. - Right ventricle: The cavity size was mildly dilated. Wall   thickness was normal. Systolic function was mildly reduced. - Tricuspid valve: There was trivial regurgitation. Regurgitant   peak velocity: 323 cm/s. - Pulmonary arteries: Systolic pressure was moderately increased.   PA peak pressure: 50 mm Hg (S). - Pericardium, extracardiac: There was a left pleural effusion.  Patient Profile     83 y.o. male with PMH of DM with peripheral neuropathy; HTN; carotid steonsis s/p CEA x 2; BPH; and afib on Eliquis, admitted from [facility] 03/22 w/ new dx colon CA.  Assessment & Plan    1. Permanent Afib: Rates remained controlled on Iv dilt, on IV heparin for anticoagulation for invasive procedures. Had colonoscopy yesterday, waiting to see surgery today.  -- would resume oral regimen when appropriate from surgery standpoint.   2. Acute systolic HF: Echo this admission shows reduced EF from 60% in 2015 to 30-35%. Denies any anginal symptoms. This does increase his overall risk for surgical intervention, but does not prevent.  -- would convert Dilt to Toprol once taking POs with this new finding.  -- does have some lower extremity edema, will give 20mg  IV lasix x1 today. BMET in the morning.  3. Abd mass: Underwent  colonoscopy yesterday with polyp removed. GI and surgery following  4. HTN: controlled  Signed, Reino Bellis, NP  05/14/2016, 9:46 AM    I have seen, examined and evaluated the patient this PM along with Ms. Mancel Bale, NP.  After reviewing all the available data and chart, we discussed the patients laboratory, study & physical findings as well as symptoms in detail. I agree with her findings, examination as well as impression recommendations as per our discussion.    Overall he remains stable cardiac standpoint.  A. fib rate continues to be controlled with diltiazem and oral beta blocker - would need to convert oral beta blocker to IV postoperatively.  Discharge perioperative risk assessment goes echocardiogram did show reduced EF, however he does not have any active anginal or heart failure symptoms indicated he is not decompensated. Based on the revised cardiac risk index, he would still be considered a high-risk patient regardless because of his history of stroke, diabetes and intraperitoneal surgery.. This risk is reduced with him being on beta blocker. The presence of a reduced ejection fraction simply adds to this risk somewhat as there is more risk of arrhythmia or heart failure. The beta blocker also reduces that risk, and watching very closely for volume shifts is very important. I do suspect with a little bit of edema and he may intermittently need IV Lasix. At present he is clearly euvolemic as he is lying flat.  I began as per the original consult note, I don't think I would allow this new finding of a reduced ejection fraction delayed planes for proceeding to the OR as at present I would not do any additional evaluation as an urgent requirement. Once he has recovered from his surgery, we can consider evaluating the etiology for his cardiomyopathy. Most likely based on global hypokinesis, it is related to his A. fib, however since there is been over 3 years since his last evaluation, we  don't know the timing of this.   In addition to the time spent by Ms. Mancel Bale, I spent close to 30 minutes in direct counseling with the patient explaining the cardiac findings as well as his perioperative risk. We basically concluded that the surgery was still something that probably need to be done and the risks of surgery are outweighed by the risks of not doing surgery.   Glenetta Hew, M.D., M.S. Interventional Cardiologist   Pager # [phone] Phone # [phone] [address] [place], [place] [address]

## 2016-05-15 ENCOUNTER — Inpatient Hospital Stay (HOSPITAL_COMMUNITY): Payer: PPO

## 2016-05-15 DIAGNOSIS — E440 Moderate protein-calorie malnutrition: Secondary | ICD-10-CM | POA: Insufficient documentation

## 2016-05-15 LAB — CBC
HCT: 33.9 % — ABNORMAL LOW (ref 39.0–52.0)
HEMOGLOBIN: 11.4 g/dL — AB (ref 13.0–17.0)
MCH: 30.6 pg (ref 26.0–34.0)
MCHC: 33.6 g/dL (ref 30.0–36.0)
MCV: 90.9 fL (ref 78.0–100.0)
Platelets: 211 10*3/uL (ref 150–400)
RBC: 3.73 MIL/uL — ABNORMAL LOW (ref 4.22–5.81)
RDW: 14.6 % (ref 11.5–15.5)
WBC: 7.9 10*3/uL (ref 4.0–10.5)

## 2016-05-15 LAB — GLUCOSE, CAPILLARY
Glucose-Capillary: 159 mg/dL — ABNORMAL HIGH (ref 65–99)
Glucose-Capillary: 160 mg/dL — ABNORMAL HIGH (ref 65–99)
Glucose-Capillary: 201 mg/dL — ABNORMAL HIGH (ref 65–99)

## 2016-05-15 LAB — BASIC METABOLIC PANEL
Anion gap: 6 (ref 5–15)
CO2: 22 mmol/L (ref 22–32)
CREATININE: 0.79 mg/dL (ref 0.61–1.24)
Calcium: 8.6 mg/dL — ABNORMAL LOW (ref 8.9–10.3)
Chloride: 106 mmol/L (ref 101–111)
Glucose, Bld: 118 mg/dL — ABNORMAL HIGH (ref 65–99)
POTASSIUM: 4 mmol/L (ref 3.5–5.1)
SODIUM: 134 mmol/L — AB (ref 135–145)

## 2016-05-15 LAB — HEPARIN LEVEL (UNFRACTIONATED): HEPARIN UNFRACTIONATED: 0.48 [IU]/mL (ref 0.30–0.70)

## 2016-05-15 LAB — CANCER ANTIGEN 19-9: CA 19 9: 6576 U/mL — AB (ref 0–35)

## 2016-05-15 MED ORDER — ADULT MULTIVITAMIN W/MINERALS CH
1.0000 | ORAL_TABLET | Freq: Every day | ORAL | Status: DC
  Filled 2016-05-15: qty 1

## 2016-05-15 MED ORDER — GADOBENATE DIMEGLUMINE 529 MG/ML IV SOLN
15.0000 mL | Freq: Once | INTRAVENOUS | Status: AC | PRN
  Administered 2016-05-15: 20 mL via INTRAVENOUS

## 2016-05-15 NOTE — Progress Notes (Signed)
ANTICOAGULATION CONSULT NOTE   Pharmacy Consult for Heparin Indication: atrial fibrillation  No Known Allergies  Patient Measurements: Height: 5\' 6"  (167.6 cm) Weight: 157 lb (71.2 kg) IBW/kg (Calculated) : 63.8 Heparin Dosing Weight: 71.9  Vital Signs: Temp: 97.9 F (36.6 C) (03/28 0620) Temp Source: Oral (03/28 0620) BP: 109/61 (03/28 0620) Pulse Rate: 80 (03/28 0620)  Labs:  Recent Labs  05/13/16 0018  05/13/16 0456 05/13/16 2349 05/14/16 0513 05/14/16 0926 05/15/16 0505  HGB  --   < > 12.9*  --  10.8*  --  11.4*  HCT  --   --  39.4  --  32.8*  --  33.9*  PLT  --   --  235  --  185  --  211  APTT 64*  --  44* 90*  --   --   --   HEPARINUNFRC  --   < > 0.69 0.44  --  0.37 0.48  CREATININE  --   --   --   --   --   --  0.79  < > = values in this interval not displayed.  Estimated Creatinine Clearance: 57.6 mL/min (by C-G formula based on SCr of 0.79 mg/dL).  Assessment: 89 yoM admitted with probable colon perforation and mass, in Afib, seen prev at [place]: for elevated HR - tried Diltiazem & Metoprolol, anti-nausea meds. Hx CVA, on Apixaban 5mg  bid, LD 3/21 at 1700   Today, 05/15/2016:  Heparin level this morning remains therapeutic at 0.48 with infusion at 1000 units/hr  CBC- stable. S/p colonoscopy 3/26 with polyp removed.  Awaiting surgery plan  No bleeding issues reported.  Goal of Therapy:  Heparin level 0.3-0.7 units/ml Monitor platelets by anticoagulation protocol: Yes  Plan:   Continue heparin infusion at 1000 units/hr  Daily heparin level and CBC while on heparin infusion F/U surgery plans, OK to resume Apixaban 48hrs following colonoscopy from GI standpoint.   Eudelia Bunch, Pharm.D. [phone] 05/15/2016 7:25 AM

## 2016-05-15 NOTE — Progress Notes (Signed)
Initial Nutrition Assessment  DOCUMENTATION CODES:   Non-severe (moderate) malnutrition in context of chronic illness  INTERVENTION:   Glucerna Shake po TID, each supplement provides 220 kcal and 10 grams of protein  Boost Breeze po TID, each supplement provides 250 kcal and 9 grams of protein  MVI  NUTRITION DIAGNOSIS:   Malnutrition related to catabolic illness, heart disease and probable colon cancer as evidenced by moderate depletions of muscle mass, moderate depletion of body fat.  GOAL:   Patient will meet greater than or equal to 90% of their needs  MONITOR:   PO intake, Supplement acceptance, Labs, Weight trends  REASON FOR ASSESSMENT:   Consult Assessment of nutrition requirement/status  ASSESSMENT:   83 year old male past medical history of atrial fibrillation on chronic anticoagulation and diabetes mellitus who has had several hospitalizations at the [place] for C. difficile including one 2 weeks ago was evaluated at [facility] for several days of severe nausea and vomiting and very loose stools. Patient then underwent a CT scan which noted a mass extending from the descending colon into inferior splenic hilum and with spleen involvement concerning for cancer.  Suspected Colon cancer ([place]) with colonic mass causing partial obstruction extending into spleen: Per MD note, patient is to be a surgery candidate, he would require a partial colectomy with splenectomy. Pt seen by palliative care and patient would like everything done. GI flexible sigmoidoscopy on revealing. Pathology negative. Awaiting surgical plan.   Met with pt and wife in room today. Pt is upset that he is NPO for MRI today. Pt reports that he is hungry and that his stomach is making noises. Pt reports that his appetite has not been good for the past two years and that he used to weigh 190lbs. Pt reports that his appetite has really declined over the past month. Pt has been drinking Glucerna, at  least one per day, that he gets from the [facility] hospital. Per pt, his weight is stable around 155lbs. Pt ordered for Boost Breeze and Glucerna. RD discussed with pt the importance of adequate protein intake given his current disease processes.   Medications reviewed and include: insulin  Labs reviewed: Na 134(L), BUN <5(L), Ca 8.6(L), prealb 9.8(L) 3/24  Nutrition-Focused physical exam completed. Findings are moderate fat depletion in chest and upper arms, moderate muscle depletion in clavicles, hands, and shoulders, and mild edema in ankles.   Diet Order:  Diet clear liquid Room service appropriate? Yes; Fluid consistency: Thin  Skin:  Reviewed, no issues  Last BM:  3/26  Height:   Ht Readings from Last 1 Encounters:  05/09/16 5' 6"  (1.676 m)    Weight:   Wt Readings from Last 1 Encounters:  05/15/16 154 lb 12.8 oz (70.2 kg)    Ideal Body Weight:  64.5 kg  BMI:  Body mass index is 24.99 kg/m.  Estimated Nutritional Needs:   Kcal:  1900-2200kcal/day   Protein:  98-112g/day   Fluid:  >1.9L/day   EDUCATION NEEDS:   No education needs identified at this time  Koleen Distance, RD, LDN Pager #[phone]

## 2016-05-15 NOTE — Progress Notes (Signed)
PROGRESS NOTE  Benjamin Hurley XMI:[id number] DOB: 08-Dec-1925 DOA: 05/09/2016 PCP: No PCP Per Patient  HPI/Recap of past 23 hours: 83 year old male past medical history of atrial fibrillation on chronic anticoagulation and diabetes mellitus who said several hospitalizations at the [place] for C. difficile including one 2 weeks ago was evaluated at [facility] for several days of severe nausea and vomiting and very loose stools and reportedly had a chest x-ray that showed questionable free air. Patient then underwent a CT scan which did not note any type of bowel perforation, but did note a mass extending from the descending colon into inferior splenic hilum and with spleen involvement concerning for cancer. Hospitalists were contacted and patient was transferred over to [facility] long hospital.   Stools tested on admission negative for C. difficile.  Seen by general surgery and cardiology. Patient underwent flexible sigmoidoscopy 3/26 which noted some stricture and 1 benign polyp, but no visible mass. GI felt findings could be consistent with colon cancer within the colonic wall versus extrinsic compression. Echocardiogram done by cardiology noted significant decrease of ejection fraction of 25-30 percent however his risk for surgery remains the same regardless of ejection fraction and patient started on beta blocker for risk reduction. Awaiting surgery plan.  Patient is irritated/perseverative about not being able to eat. No other complaints. Abd pain is stable.   Assessment/Plan: Principal Problem:  Suspected Colon cancer ([place]) with colonic mass causing partial obstruction extending into spleen: CEA level somewhat elevated at 23. CA 19-9 severely elevated at 6,576. GI flexible sigmoidoscopy unrevealing. Pathology negative. - MRI today, anticipate biopsy per surgery note. - General surgery says he would require a partial colectomy with splenectomy.   - Nutritional status optimization,  supplementation with boost breeze, patient is not protein calorie malnourished. Nutrition is consulted.  - Cardiology on board for surgical clearance. He does remain high risk, but has been cleared from cardiac standpoint.  - Seen by palliative care and patient would like everything done.   Active Problems:  Chronic Atrial fibrillation with RVR [facility]): Chads 2 score of 7.  - Eliquis on hold and patient on IV heparin.  - Was transiently in RVR, now rate controlled on home medications.  - Will DC cardizem gtt given low EF, continue po beta blocker.   Chronic systolic heart failure: EF 30% from 60% in 2015 with diffuse hypokinesis. ?AFib-mediated. - No ischemic evaluation planned prior to surgery, though he is at high risk.   - Cardiology recommends outpatient follow-up for evaluation of systolic disease later on. - Daily weight, strict I/O. Weights stable. Appears euvolemic.    Hypomagnesemia: Resolved. Goal >2.    Diabetes mellitus, type 2 ([place]): With decreased by mouth, patient has had limited hypoglycemia  - Checking A1c.  - Have decrease Lantus from 20 units down to 7. CBGs at inpatient goal.   Code Status: Full code   Family Communication: Wife and daughter at the bedside   Disposition Plan: Depending on further work up, likely MRI today, IR biopsy 3/29.   Consultants: General surgery Cardiology Palliative care Gastroenterology   Procedures:  None   Antimicrobials:  None   DVT prophylaxis: IV heparin  Objective: Vitals:   05/14/16 2159 05/15/16 0620 05/15/16 1100 05/15/16 1417  BP: (!) 114/50 109/61 (!) 139/93 112/75  Pulse: 75 80 97 64  Resp: 17 18  19   Temp: 97.5 F (36.4 C) 97.9 F (36.6 C)  98.9 F (37.2 C)  TempSrc: Oral Oral  Oral  SpO2: 98% 100%  95%  Weight:  70.2 kg (154 lb 12.8 oz)    Height:        Intake/Output Summary (Last 24 hours) at 05/15/16 1524 Last data filed at 05/15/16 1420  Gross per 24 hour  Intake          1439.67 ml  Output                 0 ml  Net          1439.67 ml   Filed Weights   05/13/16 0630 05/14/16 0444 05/15/16 0620  Weight: 74.5 kg (164 lb 3.9 oz) 71.2 kg (157 lb) 70.2 kg (154 lb 12.8 oz)    Exam:   General:  Alert and irritable, and oriented 2, no acute distress   Cardiovascular: Irregular rhythm, Rate controlled.   Respiratory: Clear to Auscultation bilaterally, nonlabored  Abdomen:  soft, mild left sided tenderness without rebound or guarding. No masses felt.   Musculoskeletal: no clubbing or cyanosis, trace pitting edema   Skin: no skin breaks, tears or lesions  Psychiatry: patient is appropriate, no evidence of psychoses    Data Reviewed: CBC:  Recent Labs Lab 05/11/16 0433 05/12/16 0508 05/13/16 0456 05/14/16 0513 05/15/16 0505  WBC 4.5 6.4 6.9 6.0 7.9  HGB 10.9* 10.9* 12.9* 10.8* 11.4*  HCT 32.2* 31.9* 39.4 32.8* 33.9*  MCV 91.0 90.6 90.6 92.9 90.9  PLT 210 214 235 185 563   Basic Metabolic Panel:  Recent Labs Lab 05/10/16 0500 05/10/16 1200 05/15/16 0505  NA 134* 130* 134*  K 4.8 4.5 4.0  CL 104 101 106  CO2 22 21* 22  GLUCOSE 85 226* 118*  BUN 18 18 <5*  CREATININE 1.01 1.07 0.79  CALCIUM 8.8* 8.5* 8.6*  MG 1.9  --   --    GFR: Estimated Creatinine Clearance: 57.6 mL/min (by C-G formula based on SCr of 0.79 mg/dL). Liver Function Tests:  Recent Labs Lab 05/10/16 1200  AST 31  ALT 14*  ALKPHOS 68  BILITOT 0.7  PROT 6.8  ALBUMIN 3.4*   No results for input(s): LIPASE, AMYLASE in the last 168 hours. No results for input(s): AMMONIA in the last 168 hours. Coagulation Profile: No results for input(s): INR, PROTIME in the last 168 hours. Cardiac Enzymes:  Recent Labs Lab 05/09/16 2057 05/10/16 0500 05/10/16 1200  TROPONINI <0.03 <0.03 <0.03   BNP (last 3 results) No results for input(s): PROBNP in the last 8760 hours. HbA1C: No results for input(s): HGBA1C in the last 72 hours. CBG:  Recent Labs Lab 05/14/16 1148 05/14/16 1627  05/14/16 2153 05/15/16 0809 05/15/16 1135  GLUCAP 162* 129* 124* 159* 160*   Lipid Profile: No results for input(s): CHOL, HDL, LDLCALC, TRIG, CHOLHDL, LDLDIRECT in the last 72 hours. Thyroid Function Tests: No results for input(s): TSH, T4TOTAL, FREET4, T3FREE, THYROIDAB in the last 72 hours. Anemia Panel: No results for input(s): VITAMINB12, FOLATE, FERRITIN, TIBC, IRON, RETICCTPCT in the last 72 hours. Urine analysis: No results found for: COLORURINE, APPEARANCEUR, LABSPEC, PHURINE, GLUCOSEU, HGBUR, BILIRUBINUR, KETONESUR, PROTEINUR, UROBILINOGEN, NITRITE, LEUKOCYTESUR Sepsis Labs: @LABRCNTIP (procalcitonin:4,lacticidven:4)  ) Recent Results (from the past 240 hour(s))  Gastrointestinal Panel by PCR , Stool     Status: None   Collection Time: 05/10/16  3:07 AM  Result Value Ref Range Status   Campylobacter species NOT DETECTED NOT DETECTED Final   Plesimonas shigelloides NOT DETECTED NOT DETECTED Final   Salmonella species NOT DETECTED NOT DETECTED Final  Yersinia enterocolitica NOT DETECTED NOT DETECTED Final   Vibrio species NOT DETECTED NOT DETECTED Final   Vibrio cholerae NOT DETECTED NOT DETECTED Final   Enteroaggregative E coli (EAEC) NOT DETECTED NOT DETECTED Final   Enteropathogenic E coli (EPEC) NOT DETECTED NOT DETECTED Final   Enterotoxigenic E coli (ETEC) NOT DETECTED NOT DETECTED Final   Shiga like toxin producing E coli (STEC) NOT DETECTED NOT DETECTED Final   Shigella/Enteroinvasive E coli (EIEC) NOT DETECTED NOT DETECTED Final   Cryptosporidium NOT DETECTED NOT DETECTED Final   Cyclospora cayetanensis NOT DETECTED NOT DETECTED Final   Entamoeba histolytica NOT DETECTED NOT DETECTED Final   Giardia lamblia NOT DETECTED NOT DETECTED Final   Adenovirus F40/41 NOT DETECTED NOT DETECTED Final   Astrovirus NOT DETECTED NOT DETECTED Final   Norovirus GI/GII NOT DETECTED NOT DETECTED Final   Rotavirus A NOT DETECTED NOT DETECTED Final   Sapovirus (I, II, IV, and V)  NOT DETECTED NOT DETECTED Final  C difficile quick scan w PCR reflex     Status: None   Collection Time: 05/10/16  3:07 AM  Result Value Ref Range Status   C Diff antigen NEGATIVE NEGATIVE Final   C Diff toxin NEGATIVE NEGATIVE Final   C Diff interpretation No C. difficile detected.  Final      Studies: No results found.  Scheduled Meds: . feeding supplement  1 Container Oral TID BM  . feeding supplement (GLUCERNA SHAKE)  237 mL Oral TID BM  . gabapentin  300 mg Oral QHS  . insulin aspart  0-15 Units Subcutaneous TID WC  . insulin glargine  7 Units Subcutaneous QHS  . latanoprost  1 drop Both Eyes QHS  . metoprolol tartrate  25 mg Oral BID  . multivitamin with minerals  1 tablet Oral Daily  . polyvinyl alcohol  1 drop Both Eyes BID    Continuous Infusions: . sodium chloride 50 mL/hr at 05/15/16 0501  . heparin 1,000 Units/hr (05/14/16 1915)    LOS: 6 days   Vance Gather, MD [facility] Pager [phone]   If 7PM-7AM, please contact night-coverage [email or web address] Password [facility] 05/15/2016, 3:24 PM

## 2016-05-15 NOTE — Progress Notes (Signed)
2 Days Post-Op  Subjective: He has pain in the left upper quadrant that persist. His son acute pain but apparently is there all the time. He is also hungry waiting on the MRI. That will not be done until 6 PM tonight. They're going to feed him now and then keep him nothing by mouth from 2 PM until the test.  Objective: Vital signs in last 24 hours: Temp:  [97.5 F (36.4 C)-98.2 F (36.8 C)] 97.9 F (36.6 C) (03/28 0620) Pulse Rate:  [75-80] 80 (03/28 0620) Resp:  [17-19] 18 (03/28 0620) BP: (109-122)/(50-86) 109/61 (03/28 0620) SpO2:  [98 %-100 %] 100 % (03/28 0620) Weight:  [70.2 kg (154 lb 12.8 oz)] 70.2 kg (154 lb 12.8 oz) (03/28 0620) Last BM Date: 05/13/16 620 PO recorded 1495 IV Voided x 5 BM x 4 Afebrile, VSS Labs:  CBC and BMP OK CA 19-9  6576  (0-35 normal) MRI scheduled for 6 PM tonight,  Ordered 6:15 last PM  Intake/Output from previous day: 03/27 0701 - 03/28 0700 In: 2115 [P.O.:620; I.V.:1495] Out: -  Intake/Output this shift: No intake/output data recorded.  General appearance: alert, cooperative and no distress GI: He is soft, he is not tender on palpation. He is not distended normal bowel function. He will tell you he has discomfort in the left upper quadrant.  Lab Results:   Recent Labs  05/14/16 0513 05/15/16 0505  WBC 6.0 7.9  HGB 10.8* 11.4*  HCT 32.8* 33.9*  PLT 185 211    BMET  Recent Labs  05/15/16 0505  NA 134*  K 4.0  CL 106  CO2 22  GLUCOSE 118*  BUN <5*  CREATININE 0.79  CALCIUM 8.6*   PT/INR No results for input(s): LABPROT, INR in the last 72 hours.   Recent Labs Lab 05/10/16 1200  AST 31  ALT 14*  ALKPHOS 68  BILITOT 0.7  PROT 6.8  ALBUMIN 3.4*     Lipase  No results found for: LIPASE   Studies/Results: No results found.  Medications: . feeding supplement  1 Container Oral TID BM  . feeding supplement (GLUCERNA SHAKE)  237 mL Oral TID BM  . gabapentin  300 mg Oral QHS  . insulin aspart  0-15 Units  Subcutaneous TID WC  . insulin glargine  7 Units Subcutaneous QHS  . latanoprost  1 drop Both Eyes QHS  . metoprolol tartrate  25 mg Oral BID  . polyvinyl alcohol  1 drop Both Eyes BID   . sodium chloride 50 mL/hr at 05/15/16 0501  . heparin 1,000 Units/hr (05/14/16 1915)     1. Colon, polyp(s), transverse - TUBULAR ADENOMA(S). - HIGH GRADE DYSPLASIA IS NOT IDENTIFIED. 2. Colon, biopsy, mass - BENIGN POLYPOID COLORECTAL MUCOSA WITH A LYMPHOID AGGREGATE. - THERE IS NO EVIDENCE OF MALIGNANCY Assessment/Plan Left colon and splenic flexure mass infiltrating spleen - colonoscopy 3/26 showed 8 mm proximal transverse polyp removed, Diverticulosis in the sigmoid colon, and stricture in the proximal descending colon that was biopsied and tattooed at distal margin - Polyp path c/w tubular adenoma. Colon biopsies at site of stenosis were benign  Chronic atrial fibrillation - eliquis on hold.  Acute systolic HF - ECHO 6/16 EF 30%-35%, awaiting cardiology recs regarding preop clearance HTN DM Dementia  ID - none FEN - CLD VTE - heparin  Plan: He is scheduled for an MRI today. CA 19-9 is strongly positive. Pending the MRI we anticipate asking IR for biopsy of the abdominal mass. We will  continue to follow with you.      LOS: 6 days    Panfilo Ketchum 05/15/2016 [phone]

## 2016-05-15 NOTE — Progress Notes (Addendum)
Progress Note  Patient Name: Benjamin Hurley Date of Encounter: 05/15/2016  Primary Cardiologist: Harl Bowie  Subjective   Feeling well today - no CP or SOB. Hungry, wants to eat.  -- food just delivered  Inpatient Medications    Scheduled Meds: . feeding supplement  1 Container Oral TID BM  . feeding supplement (GLUCERNA SHAKE)  237 mL Oral TID BM  . gabapentin  300 mg Oral QHS  . insulin aspart  0-15 Units Subcutaneous TID WC  . insulin glargine  7 Units Subcutaneous QHS  . latanoprost  1 drop Both Eyes QHS  . metoprolol tartrate  25 mg Oral BID  . polyvinyl alcohol  1 drop Both Eyes BID   Continuous Infusions: . sodium chloride 50 mL/hr at 05/15/16 0501  . diltiazem (CARDIZEM) infusion 5 mg/hr (05/15/16 0200)  . heparin 1,000 Units/hr (05/14/16 1915)   PRN Meds: acetaminophen, alum & mag hydroxide-simeth, morphine injection, ondansetron (ZOFRAN) IV, zolpidem   Vital Signs    Vitals:   05/14/16 0444 05/14/16 1455 05/14/16 2159 05/15/16 0620  BP: 134/76 122/86 (!) 114/50 109/61  Pulse: 78 76 75 80  Resp: 16 19 17 18   Temp: 97.8 F (36.6 C) 98.2 F (36.8 C) 97.5 F (36.4 C) 97.9 F (36.6 C)  TempSrc: Oral Oral Oral Oral  SpO2: 100% 100% 98% 100%  Weight: 157 lb (71.2 kg)   154 lb 12.8 oz (70.2 kg)  Height:        Intake/Output Summary (Last 24 hours) at 05/15/16 8315 Last data filed at 05/15/16 0600  Gross per 24 hour  Intake             2115 ml  Output                0 ml  Net             2115 ml   Filed Weights   05/13/16 0630 05/14/16 0444 05/15/16 0620  Weight: 164 lb 3.9 oz (74.5 kg) 157 lb (71.2 kg) 154 lb 12.8 oz (70.2 kg)    Telemetry    Afib (controlled) - Personally Reviewed  ECG    N/A - Personally Reviewed  Physical Exam   General: Well developed, well nourished, male appearing in no acute distress. Head: Normocephalic, atraumatic.  Neck: Supple without bruits, JVD. Lungs:  Resp regular and unlabored, CTA. Heart: Irreg Irreg, S1,  S2, no S3, S4, or murmur; no rub. Abdomen: Soft, non-tender, non-distended with normoactive bowel sounds. No hepatomegaly. No rebound/guarding. No obvious abdominal masses. Extremities: No clubbing, cyanosis, 1+ bilateral LE edema. Distal pedal pulses are 2+ bilaterally. Neuro: Alert and oriented X 3. Moves all extremities spontaneously. Psych: Normal affect.  Labs    Chemistry  Recent Labs Lab 05/10/16 0500 05/10/16 1200 05/15/16 0505  NA 134* 130* 134*  K 4.8 4.5 4.0  CL 104 101 106  CO2 22 21* 22  GLUCOSE 85 226* 118*  BUN 18 18 <5*  CREATININE 1.01 1.07 0.79  CALCIUM 8.8* 8.5* 8.6*  PROT  --  6.8  --   ALBUMIN  --  3.4*  --   AST  --  31  --   ALT  --  14*  --   ALKPHOS  --  68  --   BILITOT  --  0.7  --   GFRNONAA >60 60* >60  GFRAA >60 >60 >60  ANIONGAP 8 8 6      Hematology  Recent Labs Lab 05/13/16 [id number]  05/14/16 0513 05/15/16 0505  WBC 6.9 6.0 7.9  RBC 4.35 3.53* 3.73*  HGB 12.9* 10.8* 11.4*  HCT 39.4 32.8* 33.9*  MCV 90.6 92.9 90.9  MCH 29.7 30.6 30.6  MCHC 32.7 32.9 33.6  RDW 14.3 15.0 14.6  PLT 235 185 211    Cardiac Enzymes  Recent Labs Lab 05/09/16 2057 05/10/16 0500 05/10/16 1200  TROPONINI <0.03 <0.03 <0.03   No results for input(s): TROPIPOC in the last 168 hours.   BNPNo results for input(s): BNP, PROBNP in the last 168 hours.   DDimer No results for input(s): DDIMER in the last 168 hours.    Radiology    No results found.  Cardiac Studies   TTE: 05/13/16  Study Conclusions  - Left ventricle: The cavity size was normal. Systolic function was   moderately to severely reduced. The estimated ejection fraction   was in the range of 30% to 35%. Diffuse hypokinesis. - Aortic valve: Transvalvular velocity was within the normal range.   There was no stenosis. There was no regurgitation. - Mitral valve: Transvalvular velocity was within the normal range.   There was no evidence for stenosis. There was trivial    regurgitation. - Left atrium: The atrium was mildly dilated. - Right ventricle: The cavity size was mildly dilated. Wall   thickness was normal. Systolic function was mildly reduced. - Tricuspid valve: There was trivial regurgitation. Regurgitant   peak velocity: 323 cm/s. - Pulmonary arteries: Systolic pressure was moderately increased.   PA peak pressure: 50 mm Hg (S). - Pericardium, extracardiac: There was a left pleural effusion.  Patient Profile     83 y.o. male with PMH of DM with peripheral neuropathy; HTN; carotid steonsis s/p CEA x 2; BPH; and afib on Eliquis, admitted from [facility] 03/22 w/ new dx colon CA.  Assessment & Plan    1. Permanent Afib: Rates remained controlled on Iv dilt, on IV heparin for anticoagulation for invasive procedures. Had colonoscopy with a polyp removed, surgery planning MRI today.   -- would resume oral regimen when appropriate from surgery standpoint.   2. Acute systolic HF: Echo this admission shows reduced EF from 60% in 2015 to 30-35% with diffuse hypokinesis. Question whether this is related to his Afib. Denies any anginal symptoms. This does increase his overall risk for surgical intervention, but does not prevent. Consider further invasive work up once past this acute state.  -- would convert Dilt to Toprol once taking POs with this new finding.  3. Abd mass: Underwent colonoscopy with polyp removed. GI and surgery following and planning for MRI today to further evaluate.  4. HTN: controlled  Signed, Reino Bellis, NP  05/15/2016, 9:07 AM     I have seen, examined and evaluated the patient this PM along with Ms. Mancel Bale, NP.  After reviewing all the available data and chart, we discussed the patients laboratory, study & physical findings as well as symptoms in detail. I agree with her findings, examination as well as impression recommendations as per our discussion.    Stable Afib rate. Moderately reduced LVEF with no active CHF Sx. --  would not adjust operative plans -- simply convert from IV CCB to Toprol on d/c.  Will follow.   Glenetta Hew, M.D., M.S. Interventional Cardiologist   Pager # [phone] Phone # [phone] [address] [place], [place] [address]

## 2016-05-15 NOTE — Progress Notes (Addendum)
Daily Progress Note   Patient Name: Benjamin Hurley       Date: 05/15/2016 DOB: [birth year]  Age: 83 y.o. MRN#: [id number] Attending Physician: Patrecia Pour, MD Primary Care Physician: No PCP Per Patient Admit Date: 05/09/2016  Reason for Consultation/Follow-up: Establishing goals of care  Subjective: I met today with Benjamin Hurley and his family.   He reports feeing well today but not enjoying his dinner of beef broth. We discussed nutritional status and working to increase his intake of healthy proteins such as sipping on his shakes.  He reports seeing Benjamin Hurley earlier today and knows the plan is for some sort of biopsy but is unclear exactly what this entails. We reviewed his clinical course over the past couple of days as well as plan moving forward for MRI followed by likely perc biopsy.  Length of Stay: 6  Current Medications: Scheduled Meds:  . feeding supplement  1 Container Oral TID BM  . feeding supplement (GLUCERNA SHAKE)  237 mL Oral TID BM  . gabapentin  300 mg Oral QHS  . insulin aspart  0-15 Units Subcutaneous TID WC  . insulin glargine  7 Units Subcutaneous QHS  . latanoprost  1 drop Both Eyes QHS  . metoprolol tartrate  25 mg Oral BID  . polyvinyl alcohol  1 drop Both Eyes BID    Continuous Infusions: . sodium chloride 50 mL/hr at 05/15/16 0501  . heparin 1,000 Units/hr (05/14/16 1915)    PRN Meds: acetaminophen, alum & mag hydroxide-simeth, morphine injection, ondansetron (ZOFRAN) IV, zolpidem  Physical Exam        General: Alert, awake, in no acute distress.  HEENT: No bruits, no goiter, no JVD Heart: Irregular. Lungs: Good air movement, clear Abdomen: Soft, nontender, nondistended, positive bowel sounds.  Ext: No significant edema Skin: Warm and  dry Neuro: Grossly intact, nonfocal.    Vital Signs: BP 109/61 (BP Location: Right Arm)   Pulse 80   Temp 97.9 F (36.6 C) (Oral)   Resp 18   Ht 5' 6"  (1.676 m)   Wt 70.2 kg (154 lb 12.8 oz)   SpO2 100%   BMI 24.99 kg/m  SpO2: SpO2: 100 % O2 Device: O2 Device: Not Delivered O2 Flow Rate: O2 Flow Rate (L/min): 2 L/min  Intake/output summary:  Intake/Output Summary (Last  24 hours) at 05/15/16 1117 Last data filed at 05/15/16 0600  Gross per 24 hour  Intake             1680 ml  Output                0 ml  Net             1680 ml   LBM: Last BM Date: 05/13/16 Baseline Weight: Weight: 71.9 kg (158 lb 8.2 oz) Most recent weight: Weight: 70.2 kg (154 lb 12.8 oz)       Palliative Assessment/Data:    Flowsheet Rows     Most Recent Value  Intake Tab  Referral Department  Hospitalist  Unit at Time of Referral  Oncology Unit  Palliative Care Primary Diagnosis  Cancer  Date Notified  05/09/16  Palliative Care Type  New Palliative care  Reason for referral  Clarify Goals of Care  Date of Admission  05/09/16  Date first seen by Palliative Care  05/10/16  # of days IP prior to Palliative referral  0  Clinical Assessment  Palliative Performance Scale Score  30%  Pain Max last 24 hours  4  Pain Min Last 24 hours  3  Dyspnea Max Last 24 Hours  4  Dyspnea Min Last 24 hours  3  Nausea Max Last 24 Hours  6  Nausea Min Last 24 Hours  5  Psychosocial & Spiritual Assessment  Palliative Care Outcomes  Patient/Family meeting held?  Yes  Who was at the meeting?  patient son wife   Palliative Care Outcomes  Clarified goals of care      Patient Active Problem List   Diagnosis Date Noted  . Chronic systolic CHF (congestive heart failure) ([place]) 05/14/2016  . Abnormal CT scan, colon   . LUQ abdominal pain   . Benign neoplasm of transverse colon   . Stricture of colon   . Colonic mass   . Atrial fibrillation with RVR ([place]) 05/10/2016  . Hypomagnesemia 05/10/2016  . Diabetes  mellitus, type 2 ([place]) 05/10/2016  . Encounter for palliative care   . Goals of care, counseling/discussion   . Nausea without vomiting   . Colon cancer ([place]) 05/09/2016    Palliative Care Assessment & Plan   Patient Profile: 83 y.o. 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 He reportedly had a chest x-ray that showed questionable free air. Patient then underwent a CT scan which did not note any type of bowel perforation, but did note a mass extending from the descending colon into inferior splenic hilum and with spleen involvement concerning for cancer. S/p Colonoscopy with benign mucosal biopsy.    Recommendations/Plan:  Goals of care: Discussed case with Benjamin Hurley.  Met with Benjamin Hurley today in conjunction with his wife and daughter.  I reviewed Dr. Marlowe Aschoff plan for MRI followed by likely perc biopsy with family.  They expressed understanding of plan.  Patient and his family remain invested in plan to pursue all aggressive therapies.    Goals of Care and Additional Recommendations:  Limitations on Scope of Treatment: Full Scope Treatment  Code Status:    Code Status Orders        Start     Ordered  05/09/16 2044  Full code  Continuous     05/09/16 2043    Code Status History    Date Active Date Inactive Code Status Order ID Comments User Context   This patient has a current code status but no historical code status.       Prognosis:   Unable to determine  Discharge Planning:  To Be Determined  Care plan was discussed with patient, wife, daughter, Benjamin Hurley  Thank you for allowing the Palliative Medicine Team to assist in the care of this patient.   Time In: 1820 Time Out: 1850 Total Time 30 Prolonged  Time Billed No      Greater than 50%  of this time was spent counseling and coordinating care related to the above assessment and plan.  Micheline Rough, MD  Please contact Palliative Medicine Team phone at [phone] for questions and concerns.

## 2016-05-16 DIAGNOSIS — C252 Malignant neoplasm of tail of pancreas: Secondary | ICD-10-CM

## 2016-05-16 DIAGNOSIS — I255 Ischemic cardiomyopathy: Secondary | ICD-10-CM

## 2016-05-16 DIAGNOSIS — R1902 Left upper quadrant abdominal swelling, mass and lump: Secondary | ICD-10-CM

## 2016-05-16 LAB — CBC
HCT: 32.9 % — ABNORMAL LOW (ref 39.0–52.0)
Hemoglobin: 11.1 g/dL — ABNORMAL LOW (ref 13.0–17.0)
MCH: 30.7 pg (ref 26.0–34.0)
MCHC: 33.7 g/dL (ref 30.0–36.0)
MCV: 91.1 fL (ref 78.0–100.0)
PLATELETS: 152 10*3/uL (ref 150–400)
RBC: 3.61 MIL/uL — AB (ref 4.22–5.81)
RDW: 14.3 % (ref 11.5–15.5)
WBC: 6.4 10*3/uL (ref 4.0–10.5)

## 2016-05-16 LAB — GLUCOSE, CAPILLARY
GLUCOSE-CAPILLARY: 198 mg/dL — AB (ref 65–99)
Glucose-Capillary: 117 mg/dL — ABNORMAL HIGH (ref 65–99)
Glucose-Capillary: 125 mg/dL — ABNORMAL HIGH (ref 65–99)
Glucose-Capillary: 136 mg/dL — ABNORMAL HIGH (ref 65–99)
Glucose-Capillary: 182 mg/dL — ABNORMAL HIGH (ref 65–99)
Glucose-Capillary: 186 mg/dL — ABNORMAL HIGH (ref 65–99)

## 2016-05-16 LAB — HEPARIN LEVEL (UNFRACTIONATED): Heparin Unfractionated: 0.34 IU/mL (ref 0.30–0.70)

## 2016-05-16 MED ORDER — METOPROLOL TARTRATE 25 MG PO TABS
25.0000 mg | ORAL_TABLET | Freq: Four times a day (QID) | ORAL | Status: DC
  Administered 2016-05-16 – 2016-05-17 (×4): 25 mg via ORAL
  Filled 2016-05-16 (×3): qty 1

## 2016-05-16 NOTE — Progress Notes (Signed)
PROGRESS NOTE    LAWTON DOLLINGER  IWP:[id number] DOB: 07-22-1925 DOA: 05/09/2016 PCP: No PCP Per Patient   Brief Narrative:  83 yo male who presented with abdominal pain. Patient developed abdominal pain and nausea, recent hospitalization for c diff. Outpatient imaging with reported possible bowel perforation with colonic mass. On the initial evaluation patient found with afib and rvr. Abdomen was soft and non tender. Oncology and surgery have been consulted. MRI with pancreatic tail mass.    Assessment & Plan:   Principal Problem:   Colon cancer ([place]) Active Problems:   Atrial fibrillation with RVR (HCC)   Hypomagnesemia   Diabetes mellitus, type 2 ([place])   Encounter for palliative care   Goals of care, counseling/discussion   Nausea without vomiting   Abnormal CT scan, colon   LUQ abdominal pain   Benign neoplasm of transverse colon   Stricture of colon   Colonic mass   Chronic systolic CHF (congestive heart failure) (HCC)   Malnutrition of moderate degree   1. Pancreatic cancer (new diagnosis). MRI suggestive of primary pancreatic cancer, will follow on surgery recommendations, with biopsy. Advance diet as tolerated.    2. Atrial fibrillation with RVR. Continue gentle hydration with saline at 50 ml.hr. Rate control with metoprolol. Anticoagulation with heparin. Will check ekg. May be able to hold on heparin until completed GI workup.   3. Chronic and stable systolic heart failure EF 30%. No signs of volume overload, will continue a restrictive IV fluid strategy. Blood pressure 505 to 397 systolic.   4. T2DM. Will continue glucose cover and monitoring with iss, continue basal insulin, capillary glucose [phone].      DVT prophylaxis: enoxaparin  Code Status: Full  Family Communication: I spoke with patient's wife at the bedside and all questions were addressed.  Disposition Plan: Home   Consultants:   Surgery   Procedures:    Antimicrobials:       Subjective: Patient feeling well, no nausea or vomiting. Improved appetite, no fever or chills.   Objective: Vitals:   05/15/16 2007 05/15/16 2049 05/15/16 2052 05/16/16 0500  BP: 130/83 117/75  120/77  Pulse: 70 (!) 136 (!) 112 96  Resp: 18 19  18   Temp: 98.3 F (36.8 C) 97.9 F (36.6 C)  98.7 F (37.1 C)  TempSrc: Oral Oral  Oral  SpO2: 100% 97%  96%  Weight:    68.9 kg (151 lb 14.4 oz)  Height:        Intake/Output Summary (Last 24 hours) at 05/16/16 1346 Last data filed at 05/16/16 0240  Gross per 24 hour  Intake             1779 ml  Output                0 ml  Net             1779 ml   Filed Weights   05/14/16 0444 05/15/16 0620 05/16/16 0500  Weight: 71.2 kg (157 lb) 70.2 kg (154 lb 12.8 oz) 68.9 kg (151 lb 14.4 oz)    Examination:  General exam: deconditioned E ENT: Mild pallor, no icterus, oral mucosa moist.  Respiratory system: Clear to auscultation. Respiratory effort normal. No wheezing, rales or rhonchi.  Cardiovascular system: S1 & S2 heard, RRR. No JVD, murmurs, rubs, gallops or clicks. No pedal edema. Gastrointestinal system: Abdomen is nondistended, soft and nontender. No organomegaly or masses felt. Normal bowel sounds heard. Central nervous system: Alert and oriented. No  focal neurological deficits. Extremities: Symmetric 5 x 5 power. Skin: No rashes, lesions or ulcers     Data Reviewed: I have personally reviewed following labs and imaging studies  CBC:  Recent Labs Lab 05/12/16 0508 05/13/16 0456 05/14/16 0513 05/15/16 0505 05/16/16 0519  WBC 6.4 6.9 6.0 7.9 6.4  HGB 10.9* 12.9* 10.8* 11.4* 11.1*  HCT 31.9* 39.4 32.8* 33.9* 32.9*  MCV 90.6 90.6 92.9 90.9 91.1  PLT 214 235 185 211 081   Basic Metabolic Panel:  Recent Labs Lab 05/10/16 0500 05/10/16 1200 05/15/16 0505  NA 134* 130* 134*  K 4.8 4.5 4.0  CL 104 101 106  CO2 22 21* 22  GLUCOSE 85 226* 118*  BUN 18 18 <5*  CREATININE 1.01 1.07 0.79  CALCIUM 8.8* 8.5*  8.6*  MG 1.9  --   --    GFR: Estimated Creatinine Clearance: 57.6 mL/min (by C-G formula based on SCr of 0.79 mg/dL). Liver Function Tests:  Recent Labs Lab 05/10/16 1200  AST 31  ALT 14*  ALKPHOS 68  BILITOT 0.7  PROT 6.8  ALBUMIN 3.4*   No results for input(s): LIPASE, AMYLASE in the last 168 hours. No results for input(s): AMMONIA in the last 168 hours. Coagulation Profile: No results for input(s): INR, PROTIME in the last 168 hours. Cardiac Enzymes:  Recent Labs Lab 05/09/16 2057 05/10/16 0500 05/10/16 1200  TROPONINI <0.03 <0.03 <0.03   BNP (last 3 results) No results for input(s): PROBNP in the last 8760 hours. HbA1C: No results for input(s): HGBA1C in the last 72 hours. CBG:  Recent Labs Lab 05/15/16 1135 05/15/16 1648 05/15/16 2052 05/16/16 0804 05/16/16 1220  GLUCAP 160* 201* 198* 136* 117*   Lipid Profile: No results for input(s): CHOL, HDL, LDLCALC, TRIG, CHOLHDL, LDLDIRECT in the last 72 hours. Thyroid Function Tests: No results for input(s): TSH, T4TOTAL, FREET4, T3FREE, THYROIDAB in the last 72 hours. Anemia Panel: No results for input(s): VITAMINB12, FOLATE, FERRITIN, TIBC, IRON, RETICCTPCT in the last 72 hours. Sepsis Labs: No results for input(s): PROCALCITON, LATICACIDVEN in the last 168 hours.  Recent Results (from the past 240 hour(s))  Gastrointestinal Panel by PCR , Stool     Status: None   Collection Time: 05/10/16  3:07 AM  Result Value Ref Range Status   Campylobacter species NOT DETECTED NOT DETECTED Final   Plesimonas shigelloides NOT DETECTED NOT DETECTED Final   Salmonella species NOT DETECTED NOT DETECTED Final   Yersinia enterocolitica NOT DETECTED NOT DETECTED Final   Vibrio species NOT DETECTED NOT DETECTED Final   Vibrio cholerae NOT DETECTED NOT DETECTED Final   Enteroaggregative E coli (EAEC) NOT DETECTED NOT DETECTED Final   Enteropathogenic E coli (EPEC) NOT DETECTED NOT DETECTED Final   Enterotoxigenic E coli  (ETEC) NOT DETECTED NOT DETECTED Final   Shiga like toxin producing E coli (STEC) NOT DETECTED NOT DETECTED Final   Shigella/Enteroinvasive E coli (EIEC) NOT DETECTED NOT DETECTED Final   Cryptosporidium NOT DETECTED NOT DETECTED Final   Cyclospora cayetanensis NOT DETECTED NOT DETECTED Final   Entamoeba histolytica NOT DETECTED NOT DETECTED Final   Giardia lamblia NOT DETECTED NOT DETECTED Final   Adenovirus F40/41 NOT DETECTED NOT DETECTED Final   Astrovirus NOT DETECTED NOT DETECTED Final   Norovirus GI/GII NOT DETECTED NOT DETECTED Final   Rotavirus A NOT DETECTED NOT DETECTED Final   Sapovirus (I, II, IV, and V) NOT DETECTED NOT DETECTED Final  C difficile quick scan w PCR reflex  Status: None   Collection Time: 05/10/16  3:07 AM  Result Value Ref Range Status   C Diff antigen NEGATIVE NEGATIVE Final   C Diff toxin NEGATIVE NEGATIVE Final   C Diff interpretation No C. difficile detected.  Final         Radiology Studies: Mr Abdomen W Wo Contrast  Result Date: 05/16/2016 CLINICAL DATA:  Outside CT demonstrating mass extending from the descending colon into the inferior splenic hilum with splenic involvement. Diabetes. Hypertension. EXAM: MRI ABDOMEN WITHOUT AND WITH CONTRAST TECHNIQUE: Multiplanar multisequence MR imaging of the abdomen was performed both before and after the administration of intravenous contrast. CONTRAST:  105mL MULTIHANCE GADOBENATE DIMEGLUMINE 529 MG/ML IV SOLN COMPARISON:  Plain films of 05/12/2016.  CT of 05/09/2016 FINDINGS: Moderate to marked motion degradation throughout. Lower chest: Mild cardiomegaly.  Small bilateral pleural effusions. Hepatobiliary: 1.0 cm high left hepatic lobe lesion image 18/ series 12003. This is hypoenhancing and relatively well-circumscribed. Not well evaluated on T2 weighted images. Normal gallbladder, without biliary ductal dilatation. Pancreas: suboptimally evaluated, secondary to motion. marked atrophy and duct dilatation  within the body, including on image 24/series 3 and image 25/series 9. Soft tissue fullness centered about the pancreatic tail with extension into the splenic hilum. Example of more solid-appearing soft tissue fullness at 3.3 x 2.6 cm on image 48/series 12003. More cystic component (likely representing necrosis) positioned in the splenic hilum measures 2.4 x 2.4 cm on image 48/ series 12003. Spleen: Involve by direct tumor spread, as above. Extension into the splenic parenchyma including on image 47/ series 12003. Adrenals/Urinary Tract: Normal adrenal glands. Renal cortical thinning, especially in the interpolar right kidney. No hydronephrosis. Stomach/Bowel: Grossly normal stomach. No high-grade bowel obstruction identified. There is suggestion of wall thickening involving the distal transverse colon and proximal descending colon. Example image 56 and 58/ series 12003. Vascular/Lymphatic: Advanced aortic and branch vessel atherosclerosis. No retroperitoneal or retrocrural adenopathy. Other:  Small volume ascites. Musculoskeletal: No acute osseous abnormality. IMPRESSION: 1. Moderate to markedly motion degraded exam. 2. In combination with the 05/09/2016 CT findings, tumor extension into the splenic hilum is favored to arise from the pancreatic tail. Secondary involvement of the splenic flexure of the colon. Colonic primary with direct extension into the spleen is felt less likely, given absence of well-defined circumferential colonic mass or high-grade obstruction. 3. Indeterminate left hepatic lobe lesion. Recommend attention on follow-up. 4. Ascites and bilateral pleural effusions suggests fluid overload. 5. Left-sided colonic wall thickening suggested. Considerations include infectious colitis or low-grade ischemia secondary to direct tumor involvement. Electronically Signed   By: Abigail Miyamoto M.D.   On: 05/16/2016 09:45        Scheduled Meds: . feeding supplement  1 Container Oral TID BM  . feeding  supplement (GLUCERNA SHAKE)  237 mL Oral TID BM  . gabapentin  300 mg Oral QHS  . insulin aspart  0-15 Units Subcutaneous TID WC  . insulin glargine  7 Units Subcutaneous QHS  . latanoprost  1 drop Both Eyes QHS  . metoprolol tartrate  25 mg Oral Q6H  . multivitamin with minerals  1 tablet Oral Daily  . polyvinyl alcohol  1 drop Both Eyes BID   Continuous Infusions: . sodium chloride 50 mL/hr at 05/16/16 0240  . heparin 1,000 Units/hr (05/16/16 0057)     LOS: 7 days       Tawni Millers, MD [facility] Pager [phone]  If 7PM-7AM, please contact night-coverage [email or web address] Password [email or web address] 05/16/2016, 1:46 PM

## 2016-05-16 NOTE — Progress Notes (Addendum)
[facility] Progress Note  Chief Complaint:   Intraabdominal lesion, left sided abdominal pain  Subjective: Still with intermittent left sided pain. He is happy with liquids but misses solids.   Objective:  Vital signs in last 24 hours: Temp:  [97.9 F (36.6 C)-98.7 F (37.1 C)] 98.2 F (36.8 C) (03/29 1506) Pulse Rate:  [42-136] 42 (03/29 1506) Resp:  [18-19] 18 (03/29 1506) BP: (117-151)/(75-110) 151/110 (03/29 1506) SpO2:  [96 %-100 %] 100 % (03/29 1506) Weight:  [151 lb 14.4 oz (68.9 kg)-154 lb (69.9 kg)] 151 lb 14.4 oz (68.9 kg) (03/29 0500) Last BM Date: 05/14/16 General:   Alert, well-developed white male  in NAD EENT:  Normal hearing, non icteric sclera, conjunctive pink.  Heart:  Regular rate and rhythm Pulm: Normal respiratory effort, lungs Abdomen:  Soft, nondistended, mild left mid / lateral tenderness.  Normal bowel sounds, no masses felt.   Neurologic:  Alert and  oriented x4;  grossly normal neurologically. Psych:  Alert and cooperative. Normal mood and affect.   Intake/Output from previous day: 03/28 0701 - 03/29 0700 In: 2044 [P.O.:600; I.V.:1444] Out: -  Intake/Output this shift: No intake/output data recorded.  Lab Results:  Recent Labs  05/14/16 0513 05/15/16 0505 05/16/16 0519  WBC 6.0 7.9 6.4  HGB 10.8* 11.4* 11.1*  HCT 32.8* 33.9* 32.9*  PLT 185 211 152   BMET  Recent Labs  05/15/16 0505  NA 134*  K 4.0  CL 106  CO2 22  GLUCOSE 118*  BUN <5*  CREATININE 0.79  CALCIUM 8.6*     Mr Abdomen W Wo Contrast  Result Date: 05/16/2016 CLINICAL DATA:  Outside CT demonstrating mass extending from the descending colon into the inferior splenic hilum with splenic involvement. Diabetes. Hypertension. EXAM: MRI ABDOMEN WITHOUT AND WITH CONTRAST TECHNIQUE: Multiplanar multisequence MR imaging of the abdomen was performed both before and after the administration of intravenous contrast. CONTRAST:  52mL MULTIHANCE GADOBENATE  DIMEGLUMINE 529 MG/ML IV SOLN COMPARISON:  Plain films of 05/12/2016.  CT of 05/09/2016 FINDINGS: Moderate to marked motion degradation throughout. Lower chest: Mild cardiomegaly.  Small bilateral pleural effusions. Hepatobiliary: 1.0 cm high left hepatic lobe lesion image 18/ series 12003. This is hypoenhancing and relatively well-circumscribed. Not well evaluated on T2 weighted images. Normal gallbladder, without biliary ductal dilatation. Pancreas: suboptimally evaluated, secondary to motion. marked atrophy and duct dilatation within the body, including on image 24/series 3 and image 25/series 9. Soft tissue fullness centered about the pancreatic tail with extension into the splenic hilum. Example of more solid-appearing soft tissue fullness at 3.3 x 2.6 cm on image 48/series 12003. More cystic component (likely representing necrosis) positioned in the splenic hilum measures 2.4 x 2.4 cm on image 48/ series 12003. Spleen: Involve by direct tumor spread, as above. Extension into the splenic parenchyma including on image 47/ series 12003. Adrenals/Urinary Tract: Normal adrenal glands. Renal cortical thinning, especially in the interpolar right kidney. No hydronephrosis. Stomach/Bowel: Grossly normal stomach. No high-grade bowel obstruction identified. There is suggestion of wall thickening involving the distal transverse colon and proximal descending colon. Example image 56 and 58/ series 12003. Vascular/Lymphatic: Advanced aortic and branch vessel atherosclerosis. No retroperitoneal or retrocrural adenopathy. Other:  Small volume ascites. Musculoskeletal: No acute osseous abnormality. IMPRESSION: 1. Moderate to markedly motion degraded exam. 2. In combination with the 05/09/2016 CT findings, tumor extension into the splenic hilum is favored to arise from the pancreatic tail. Secondary involvement of the splenic flexure of the colon.  Colonic primary with direct extension into the spleen is felt less likely, given  absence of well-defined circumferential colonic mass or high-grade obstruction. 3. Indeterminate left hepatic lobe lesion. Recommend attention on follow-up. 4. Ascites and bilateral pleural effusions suggests fluid overload. 5. Left-sided colonic wall thickening suggested. Considerations include infectious colitis or low-grade ischemia secondary to direct tumor involvement. Electronically Signed   By: Abigail Miyamoto M.D.   On: 05/16/2016 09:45    Assessment / Plan:  83 yo male with a large lesion favored to be arising from pancreatic tail with involvement of splenic flexure and spleen . Colonoscopy by [place] this admission was negative for mass, just benign colonic stricture. Surgery following but decision has been made not to pursue surgery. IR evaluated for image guided biopsy of lesion but Dr. Barbie Banner feels EUS with FNA would be preferable. Unfortunately Dr. Ardis Hughs (who does EUS) is out of town all next week. I have placed a call to Dr. Wonda Horner with [facility] to see if he could do the EUS- waiting on call back. I updated patient and wife.  If pain controlled and medically stable patient can have the EUS as outpatient.   Principal Problem:   Colon cancer ([place]) Active Problems:   Atrial fibrillation with RVR (HCC)   Hypomagnesemia   Diabetes mellitus, type 2 ([place])   Encounter for palliative care   Goals of care, counseling/discussion   Nausea without vomiting   Abnormal CT scan, colon   LUQ abdominal pain   Benign neoplasm of transverse colon   Stricture of colon   Colonic mass   Chronic systolic CHF (congestive heart failure) (HCC)   Malnutrition of moderate degree    LOS: 7 days   Tye Savoy NP 05/16/2016, 4:08 PM  Pager number [phone]     Attending physician's note   I have taken an interval history, reviewed the chart and examined the patient. I agree with the Advanced Practitioner's note, impression and recommendations.  MRI reviewed which is concerning for a pancreatic  tail tumor extending into the splenic hilum, likely involving the colon leading to his colonic stricture. CEA = 23.2, CA19-9 = 6,576. IR feels EUS with FNA would be the preferable next step for a tissue diagnosis.  We will arrange an outpatient EUS and we will contact the patient at home with an EUS appt. If his pain is controlled he can be discharged with plans for an outpatient EUS. Advance to a full liquid diet and he can go home on this diet if he prefers. Colace bid at home for colonic stricture. GI follow up with Dr. Scarlette Shorts. EUS likely with Dr. Ardis Hughs, to be scheduled. GI signing off.   Lucio Edward, MD [facility] [phone] Mon-Fri 8a-5p [phone] after 5p, weekends, holidays

## 2016-05-16 NOTE — Progress Notes (Signed)
ANTICOAGULATION CONSULT NOTE   Pharmacy Consult for Heparin Indication: atrial fibrillation  No Known Allergies  Patient Measurements: Height: 5\' 6"  (167.6 cm) Weight: 151 lb 14.4 oz (68.9 kg) IBW/kg (Calculated) : 63.8 Heparin Dosing Weight: 71.9  Vital Signs: Temp: 98.7 F (37.1 C) (03/29 0500) Temp Source: Oral (03/29 0500) BP: 120/77 (03/29 0500) Pulse Rate: 96 (03/29 0500)  Labs:  Recent Labs  05/13/16 2349  05/14/16 0513 05/14/16 0926 05/15/16 0505 05/16/16 0519  HGB  --   < > 10.8*  --  11.4* 11.1*  HCT  --   --  32.8*  --  33.9* 32.9*  PLT  --   --  185  --  211 152  APTT 90*  --   --   --   --   --   HEPARINUNFRC 0.44  --   --  0.37 0.48 0.34  CREATININE  --   --   --   --  0.79  --   < > = values in this interval not displayed.  Estimated Creatinine Clearance: 57.6 mL/min (by C-G formula based on SCr of 0.79 mg/dL).  Assessment: 8 yoM admitted with probable colon perforation and mass, in Afib, seen prev at [place]: for elevated HR - tried Diltiazem & Metoprolol, anti-nausea meds. Hx CVA, on Apixaban 5mg  bid, LD 3/21 at 1700   Today, 05/16/2016:  Heparin level this morning remains therapeutic at 0.34 with infusion at 1000 units/hr  CBC- stable. S/p colonoscopy 3/26 with polyp removed.  Awaiting surgery plan  No bleeding issues reported.  Goal of Therapy:  Heparin level 0.3-0.7 units/ml Monitor platelets by anticoagulation protocol: Yes  Plan:   Continue heparin infusion at 1000 units/hr  Daily heparin level and CBC while on heparin infusion F/U surgery plans, OK to resume Apixaban 48hrs following colonoscopy from GI standpoint. MRI pending - likely will need perc drain with IR.   Eudelia Bunch, Pharm.D. [phone] 05/16/2016 7:10 AM

## 2016-05-16 NOTE — Progress Notes (Signed)
3 Days Post-Op  Subjective: Abdomen is soft, but still has pain LUQ. Wants to eat.  Objective: Vital signs in last 24 hours: Temp:  [97.9 F (36.6 C)-98.9 F (37.2 C)] 98.7 F (37.1 C) (03/29 0500) Pulse Rate:  [64-136] 96 (03/29 0500) Resp:  [18-19] 18 (03/29 0500) BP: (112-130)/(75-83) 120/77 (03/29 0500) SpO2:  [95 %-100 %] 96 % (03/29 0500) Weight:  [68.9 kg (151 lb 14.4 oz)-69.9 kg (154 lb)] 68.9 kg (151 lb 14.4 oz) (03/29 0500) Last BM Date: 05/14/16 600 Po 1500 IV Voided x 3 Bm x 1 Afebrile, VSS CBC is stable MRI:  1. Moderate to markedly motion degraded exam. 2. In combination with the 05/09/2016 CT findings, tumor extension into the splenic hilum is favored to arise from the pancreatic tail. Secondary involvement of the splenic flexure of the colon. Colonic primary with direct extension into the spleen is felt less likely, given absence of well-defined circumferential colonic mass or high-grade obstruction. 3. Indeterminate left hepatic lobe lesion. Recommend attention on follow-up. 4. Ascites and bilateral pleural effusions suggests fluid overload. 5. Left-sided colonic wall thickening suggested. Considerations include infectious colitis or low-grade ischemia secondary to direct tumor involvement.   Intake/Output from previous day: 03/28 0701 - 03/29 0700 In: 2044 [P.O.:600; I.V.:1444] Out: -  Intake/Output this shift: No intake/output data recorded.  General appearance: alert, cooperative and no distress GI: soft sore LUQ, no distension   Lab Results:   Recent Labs  05/15/16 0505 05/16/16 0519  WBC 7.9 6.4  HGB 11.4* 11.1*  HCT 33.9* 32.9*  PLT 211 152    BMET  Recent Labs  05/15/16 0505  NA 134*  K 4.0  CL 106  CO2 22  GLUCOSE 118*  BUN <5*  CREATININE 0.79  CALCIUM 8.6*   PT/INR No results for input(s): LABPROT, INR in the last 72 hours.   Recent Labs Lab 05/10/16 1200  AST 31  ALT 14*  ALKPHOS 68  BILITOT 0.7  PROT 6.8   ALBUMIN 3.4*     Lipase  No results found for: LIPASE   Studies/Results: Mr Abdomen W Wo Contrast  Result Date: 05/16/2016 CLINICAL DATA:  Outside CT demonstrating mass extending from the descending colon into the inferior splenic hilum with splenic involvement. Diabetes. Hypertension. EXAM: MRI ABDOMEN WITHOUT AND WITH CONTRAST TECHNIQUE: Multiplanar multisequence MR imaging of the abdomen was performed both before and after the administration of intravenous contrast. CONTRAST:  27mL MULTIHANCE GADOBENATE DIMEGLUMINE 529 MG/ML IV SOLN COMPARISON:  Plain films of 05/12/2016.  CT of 05/09/2016 FINDINGS: Moderate to marked motion degradation throughout. Lower chest: Mild cardiomegaly.  Small bilateral pleural effusions. Hepatobiliary: 1.0 cm high left hepatic lobe lesion image 18/ series 12003. This is hypoenhancing and relatively well-circumscribed. Not well evaluated on T2 weighted images. Normal gallbladder, without biliary ductal dilatation. Pancreas: suboptimally evaluated, secondary to motion. marked atrophy and duct dilatation within the body, including on image 24/series 3 and image 25/series 9. Soft tissue fullness centered about the pancreatic tail with extension into the splenic hilum. Example of more solid-appearing soft tissue fullness at 3.3 x 2.6 cm on image 48/series 12003. More cystic component (likely representing necrosis) positioned in the splenic hilum measures 2.4 x 2.4 cm on image 48/ series 12003. Spleen: Involve by direct tumor spread, as above. Extension into the splenic parenchyma including on image 47/ series 12003. Adrenals/Urinary Tract: Normal adrenal glands. Renal cortical thinning, especially in the interpolar right kidney. No hydronephrosis. Stomach/Bowel: Grossly normal stomach. No high-grade bowel obstruction  identified. There is suggestion of wall thickening involving the distal transverse colon and proximal descending colon. Example image 56 and 58/ series 12003.  Vascular/Lymphatic: Advanced aortic and branch vessel atherosclerosis. No retroperitoneal or retrocrural adenopathy. Other:  Small volume ascites. Musculoskeletal: No acute osseous abnormality. IMPRESSION: 1. Moderate to markedly motion degraded exam. 2. In combination with the 05/09/2016 CT findings, tumor extension into the splenic hilum is favored to arise from the pancreatic tail. Secondary involvement of the splenic flexure of the colon. Colonic primary with direct extension into the spleen is felt less likely, given absence of well-defined circumferential colonic mass or high-grade obstruction. 3. Indeterminate left hepatic lobe lesion. Recommend attention on follow-up. 4. Ascites and bilateral pleural effusions suggests fluid overload. 5. Left-sided colonic wall thickening suggested. Considerations include infectious colitis or low-grade ischemia secondary to direct tumor involvement. Electronically Signed   By: Abigail Miyamoto M.D.   On: 05/16/2016 09:45    Medications: . feeding supplement  1 Container Oral TID BM  . feeding supplement (GLUCERNA SHAKE)  237 mL Oral TID BM  . gabapentin  300 mg Oral QHS  . insulin aspart  0-15 Units Subcutaneous TID WC  . insulin glargine  7 Units Subcutaneous QHS  . latanoprost  1 drop Both Eyes QHS  . metoprolol tartrate  25 mg Oral Q6H  . multivitamin with minerals  1 tablet Oral Daily  . polyvinyl alcohol  1 drop Both Eyes BID   . sodium chloride 50 mL/hr at 05/16/16 0240  . heparin 1,000 Units/hr (05/16/16 0057)    Assessment/Plan Left colon and splenic flexure mass infiltrating spleen MRI shows, tumor appears to come from the pancreatic tail with secondary involvement of the splenic flexure of the colon/extension into the spleen. Indeterminate left hepatic lobe lesion - colonoscopy 3/26 showed 7mm proximaltransverse polyp removed, Diverticulosis in the sigmoid colon, and stricture in the proximal descending colon that was biopsied and tattooed at  distal margin - Polyp path c/w tubular adenoma. Colon biopsies at site of stenosis were benign  Chronic atrial fibrillation- eliquis on hold.  Acute systolic HF- ECHO 3/66 EF 30%-35%, awaiting cardiology recs regarding preop clearance HTN DM Dementia  ID - none FEN - CLD VTE - heparin   Plan:  MRI suggest Pancreatic lesion, CA 19 9  6576.  Will request biopsy, Pt NPO,and he is really hungry.  Will check on starting PO's.  He is on heparin so I don't imagine we will be able to do this until tomorrow.  I told the son, but not his wife or the patient about our concern and that the bx was a way to make sure of what was occurring.         LOS: 7 days    Haroldine Redler 05/16/2016 [phone]

## 2016-05-16 NOTE — Progress Notes (Signed)
Progress Note  Patient Name: Benjamin Hurley Date of Encounter: 05/16/2016  Primary Cardiologist: Harl Bowie  Subjective   Feeling well today - no CP or SOB. Hungry, wants to eat.  (IV Dilt d/c'd yesterday)  Inpatient Medications    Scheduled Meds: . feeding supplement  1 Container Oral TID BM  . feeding supplement (GLUCERNA SHAKE)  237 mL Oral TID BM  . gabapentin  300 mg Oral QHS  . insulin aspart  0-15 Units Subcutaneous TID WC  . insulin glargine  7 Units Subcutaneous QHS  . latanoprost  1 drop Both Eyes QHS  . metoprolol tartrate  25 mg Oral Q6H  . multivitamin with minerals  1 tablet Oral Daily  . polyvinyl alcohol  1 drop Both Eyes BID   Continuous Infusions: . sodium chloride 50 mL/hr at 05/16/16 0240  . heparin 1,000 Units/hr (05/16/16 0057)   PRN Meds: acetaminophen, alum & mag hydroxide-simeth, morphine injection, ondansetron (ZOFRAN) IV, zolpidem   Vital Signs    Vitals:   05/15/16 2007 05/15/16 2049 05/15/16 2052 05/16/16 0500  BP: 130/83 117/75  120/77  Pulse: 70 (!) 136 (!) 112 96  Resp: 18 19  18   Temp: 98.3 F (36.8 C) 97.9 F (36.6 C)  98.7 F (37.1 C)  TempSrc: Oral Oral  Oral  SpO2: 100% 97%  96%  Weight:    151 lb 14.4 oz (68.9 kg)  Height:        Intake/Output Summary (Last 24 hours) at 05/16/16 0930 Last data filed at 05/16/16 2376  Gross per 24 hour  Intake             2044 ml  Output                0 ml  Net             2044 ml   Filed Weights   05/14/16 0444 05/15/16 0620 05/16/16 0500  Weight: 157 lb (71.2 kg) 154 lb 12.8 oz (70.2 kg) 151 lb 14.4 oz (68.9 kg)    Telemetry    Afib (controlled) - Personally Reviewed - has had some rates in 120s this AM.  ECG    N/A - Personally Reviewed  Physical Exam   General: Well developed, well nourished, male appearing in no acute distress. Head: Normocephalic, atraumatic.  Lungs:  Resp regular and unlabored, CTA. Heart: Irreg Irreg - controlled rate, S1& S2, no S3, S4, or  murmur; no rub. Abdomen: Soft, non-tender, non-distended with normoactive bowel sounds. No HSM.Marland Kitchen No rebound/guarding. No obvious abdominal masses. Extremities: No clubbing, cyanosis, 1+ bilateral LE edema. Distal pedal pulses are 2+ bilaterally. Neuro: Alert and oriented X 3. Moves all extremities spontaneously. Psych: Normal affect.  Labs    Chemistry  Recent Labs Lab 05/10/16 0500 05/10/16 1200 05/15/16 0505  NA 134* 130* 134*  K 4.8 4.5 4.0  CL 104 101 106  CO2 22 21* 22  GLUCOSE 85 226* 118*  BUN 18 18 <5*  CREATININE 1.01 1.07 0.79  CALCIUM 8.8* 8.5* 8.6*  PROT  --  6.8  --   ALBUMIN  --  3.4*  --   AST  --  31  --   ALT  --  14*  --   ALKPHOS  --  68  --   BILITOT  --  0.7  --   GFRNONAA >60 60* >60  GFRAA >60 >60 >60  ANIONGAP 8 8 6      Hematology  Recent Labs  Lab 05/14/16 0513 05/15/16 0505 05/16/16 0519  WBC 6.0 7.9 6.4  RBC 3.53* 3.73* 3.61*  HGB 10.8* 11.4* 11.1*  HCT 32.8* 33.9* 32.9*  MCV 92.9 90.9 91.1  MCH 30.6 30.6 30.7  MCHC 32.9 33.6 33.7  RDW 15.0 14.6 14.3  PLT 185 211 152    Cardiac Enzymes  Recent Labs Lab 05/09/16 2057 05/10/16 0500 05/10/16 1200  TROPONINI <0.03 <0.03 <0.03   No results for input(s): TROPIPOC in the last 168 hours.   BNPNo results for input(s): BNP, PROBNP in the last 168 hours.   DDimer No results for input(s): DDIMER in the last 168 hours.    Radiology    No results found.  Cardiac Studies   TTE: 05/13/16: EF 30-35% with diffuse HF. Mild LA& RV dilation. Trivial TR.  Mod elevation of Pulm pressures ~50 mmHGg   Patient Profile     83 y.o. male with PMH of DM with peripheral neuropathy; HTN; carotid steonsis s/p CEA x 2; BPH; and afib on Eliquis, admitted from [facility] 03/22 w/ new dx colon CA. Has had multiple tests. Initially on IV Diltiazem & PO BB - IV Dilt has been d/c'd  Assessment & Plan    1. Permanent Afib: Rates remained controlled on Iv dilt -more tachycardiac since stopped., on IV  heparin for anticoagulation for invasive procedures. Had colonoscopy with a polyp removed, surgery planning Perc Drain - concer is Pancreatic Ca - so may not do Sgx.  -- Increase BB to q 6 hr for better HR control. Once procedures complete - can convert from IV Heparin back to home DOAC.  2. Cardiomyopathy: Echo this admission shows reduced EF from 60% in 2015 to 30-35% with diffuse hypokinesis. - no active CHF. ` Question whether this is related to his Afib. Denies any anginal symptoms. This does increase his overall risk for surgical intervention, but does not prevent. Consider further invasive work up once past this acute state.  -- would convert PO Lopressor to Toprol  3. Abd mass: Underwent colonoscopy with polyp removed. GI and surgery following and planning for MRI done yesterday to further evaluate no read as yet; plan is Perc Drain by IR. ? Pancreatic Ca.  4. HTN: controlled  Signed, Glenetta Hew, MD  05/16/2016, 9:30 AM

## 2016-05-16 NOTE — Progress Notes (Signed)
Patient ID: Benjamin Hurley, male   DOB: April 03, 1925, 83 y.o.   MRN: [id number] Request received for image guided biopsy of abdominal ? pancreatic mass in patient. Imaging studies were reviewed by Dr. Barbie Banner today. He recommends consultation with GI for potential EUS guided biopsy initially; if this is not possible then percutaneous biopsy of surrounding ?adenopathy vs soft tissue fullness could potentially be performed. Above discussed with Dr. Barry Dienes.

## 2016-05-17 ENCOUNTER — Telehealth: Payer: Self-pay | Admitting: Gastroenterology

## 2016-05-17 DIAGNOSIS — E440 Moderate protein-calorie malnutrition: Secondary | ICD-10-CM

## 2016-05-17 DIAGNOSIS — I10 Essential (primary) hypertension: Secondary | ICD-10-CM

## 2016-05-17 LAB — CBC
HEMATOCRIT: 32.7 % — AB (ref 39.0–52.0)
HEMOGLOBIN: 11.3 g/dL — AB (ref 13.0–17.0)
MCH: 31.4 pg (ref 26.0–34.0)
MCHC: 34.6 g/dL (ref 30.0–36.0)
MCV: 90.8 fL (ref 78.0–100.0)
Platelets: 159 10*3/uL (ref 150–400)
RBC: 3.6 MIL/uL — AB (ref 4.22–5.81)
RDW: 14.2 % (ref 11.5–15.5)
WBC: 5.7 10*3/uL (ref 4.0–10.5)

## 2016-05-17 LAB — BASIC METABOLIC PANEL
ANION GAP: 7 (ref 5–15)
BUN: 7 mg/dL (ref 6–20)
CHLORIDE: 106 mmol/L (ref 101–111)
CO2: 23 mmol/L (ref 22–32)
Calcium: 8.8 mg/dL — ABNORMAL LOW (ref 8.9–10.3)
Creatinine, Ser: 0.8 mg/dL (ref 0.61–1.24)
GFR calc non Af Amer: >60 mL/min (ref >60)
Glucose, Bld: 130 mg/dL — ABNORMAL HIGH (ref 65–99)
POTASSIUM: 4 mmol/L (ref 3.5–5.1)
Sodium: 136 mmol/L (ref 135–145)

## 2016-05-17 LAB — HEPARIN LEVEL (UNFRACTIONATED): Heparin Unfractionated: 0.5 IU/mL (ref 0.30–0.70)

## 2016-05-17 LAB — GLUCOSE, CAPILLARY: Glucose-Capillary: 103 mg/dL — ABNORMAL HIGH (ref 65–99)

## 2016-05-17 MED ORDER — ADULT MULTIVITAMIN W/MINERALS CH: 1.0000 | ORAL_TABLET | Freq: Every day | ORAL | 0 refills | Status: AC

## 2016-05-17 MED ORDER — ACETAMINOPHEN 325 MG PO TABS: 650.0000 mg | ORAL_TABLET | Freq: Four times a day (QID) | ORAL | 0 refills | Status: DC | PRN

## 2016-05-17 MED ORDER — APIXABAN 5 MG PO TABS
5.0000 mg | ORAL_TABLET | Freq: Two times a day (BID) | ORAL | Status: DC
  Administered 2016-05-17: 5 mg via ORAL
  Filled 2016-05-17: qty 1

## 2016-05-17 MED ORDER — DOCUSATE SODIUM 100 MG PO CAPS
100.0000 mg | ORAL_CAPSULE | Freq: Two times a day (BID) | ORAL | Status: DC
  Administered 2016-05-17: 100 mg via ORAL
  Filled 2016-05-17: qty 1

## 2016-05-17 MED ORDER — METOPROLOL TARTRATE 50 MG PO TABS
50.0000 mg | ORAL_TABLET | Freq: Two times a day (BID) | ORAL | Status: DC
  Administered 2016-05-17: 50 mg via ORAL
  Filled 2016-05-17: qty 1

## 2016-05-17 MED ORDER — INSULIN ASPART 100 UNIT/ML FLEXPEN: 7.0000 [IU] | PEN_INJECTOR | Freq: Every day | SUBCUTANEOUS | 11 refills | Status: DC

## 2016-05-17 MED ORDER — METOPROLOL TARTRATE 50 MG PO TABS: 50.0000 mg | ORAL_TABLET | Freq: Two times a day (BID) | ORAL | 0 refills | Status: AC

## 2016-05-17 MED ORDER — DOCUSATE SODIUM 100 MG PO CAPS: 100.0000 mg | ORAL_CAPSULE | Freq: Two times a day (BID) | ORAL | 0 refills | Status: DC

## 2016-05-17 MED ORDER — SODIUM CHLORIDE 0.9% FLUSH: 10.0000 mL | Freq: Two times a day (BID) | INTRAVENOUS | Status: DC

## 2016-05-17 NOTE — Progress Notes (Signed)
Progress Note  Patient Name: Benjamin Hurley Date of Encounter: 05/17/2016  Primary Cardiologist: Harl Bowie from [facility], ([place])  Subjective   Feeling well today - no CP - short spell of dyspnea - resolved Diffuse sweating last PM  Inpatient Medications    Scheduled Meds: . feeding supplement  1 Container Oral TID BM  . feeding supplement (GLUCERNA SHAKE)  237 mL Oral TID BM  . gabapentin  300 mg Oral QHS  . insulin aspart  0-15 Units Subcutaneous TID WC  . insulin glargine  7 Units Subcutaneous QHS  . latanoprost  1 drop Both Eyes QHS  . metoprolol tartrate  25 mg Oral Q6H  . multivitamin with minerals  1 tablet Oral Daily  . polyvinyl alcohol  1 drop Both Eyes BID   Continuous Infusions: . sodium chloride 50 mL/hr at 05/17/16 0340  . heparin 1,000 Units/hr (05/17/16 0340)   PRN Meds: acetaminophen, alum & mag hydroxide-simeth, morphine injection, ondansetron (ZOFRAN) IV, zolpidem   Vital Signs    Vitals:   05/16/16 1832 05/16/16 2006 05/17/16 0500 05/17/16 0548  BP: 113/67 112/63  125/74  Pulse: 94 (!) 115  95  Resp:  18  18  Temp:  98.7 F (37.1 C)  97.8 F (36.6 C)  TempSrc:  Oral  Oral  SpO2:  97%  97%  Weight:   153 lb (69.4 kg)   Height:        Intake/Output Summary (Last 24 hours) at 05/17/16 0842 Last data filed at 05/17/16 0600  Gross per 24 hour  Intake             1360 ml  Output                0 ml  Net             1360 ml   Filed Weights   05/15/16 0620 05/16/16 0500 05/17/16 0500  Weight: 154 lb 12.8 oz (70.2 kg) 151 lb 14.4 oz (68.9 kg) 153 lb (69.4 kg)    Telemetry    Afib (controlled) - Personally Reviewed - has had some rates in 120s this AM.  ECG    N/A - Personally Reviewed  Physical Exam   General: Well developed, well nourished, male appearing in no acute distress. Head: Normocephalic, atraumatic.  Lungs:  Resp regular and unlabored, CTA. Heart: Irreg Irreg - controlled rate, S1& S2, no S3, S4, or murmur; no rub. Abdomen:  Soft, non-tender, non-distended with normoactive bowel sounds. No HSM.Marland Kitchen No rebound/guarding. No obvious abdominal masses. Extremities: No clubbing, cyanosis, 1+ bilateral LE edema. Distal pedal pulses are 2+ bilaterally. Neuro: Alert and oriented X 3. Moves all extremities spontaneously. Psych: Normal affect. Getting a bit frustrated @ all the waiting  Labs    Chemistry  Recent Labs Lab 05/10/16 1200 05/15/16 0505 05/17/16 0516  NA 130* 134* 136  K 4.5 4.0 4.0  CL 101 106 106  CO2 21* 22 23  GLUCOSE 226* 118* 130*  BUN 18 <5* 7  CREATININE 1.07 0.79 0.80  CALCIUM 8.5* 8.6* 8.8*  PROT 6.8  --   --   ALBUMIN 3.4*  --   --   AST 31  --   --   ALT 14*  --   --   ALKPHOS 68  --   --   BILITOT 0.7  --   --   GFRNONAA 60* >60 >60  GFRAA >60 >60 >60  ANIONGAP 8 6 7  Hematology  Recent Labs Lab 05/15/16 0505 05/16/16 0519 05/17/16 0516  WBC 7.9 6.4 5.7  RBC 3.73* 3.61* 3.60*  HGB 11.4* 11.1* 11.3*  HCT 33.9* 32.9* 32.7*  MCV 90.9 91.1 90.8  MCH 30.6 30.7 31.4  MCHC 33.6 33.7 34.6  RDW 14.6 14.3 14.2  PLT 211 152 159    Cardiac Enzymes  Recent Labs Lab 05/10/16 1200  TROPONINI <0.03   No results for input(s): TROPIPOC in the last 168 hours.   BNPNo results for input(s): BNP, PROBNP in the last 168 hours.   DDimer No results for input(s): DDIMER in the last 168 hours.    Radiology    Mr Abdomen W Wo Contrast  Result Date: 05/16/2016 CLINICAL DATA:  Outside CT demonstrating mass extending from the descending colon into the inferior splenic hilum with splenic involvement. Diabetes. Hypertension. EXAM: MRI ABDOMEN WITHOUT AND WITH CONTRAST TECHNIQUE: Multiplanar multisequence MR imaging of the abdomen was performed both before and after the administration of intravenous contrast. CONTRAST:  37mL MULTIHANCE GADOBENATE DIMEGLUMINE 529 MG/ML IV SOLN COMPARISON:  Plain films of 05/12/2016.  CT of 05/09/2016 FINDINGS: Moderate to marked motion degradation  throughout. Lower chest: Mild cardiomegaly.  Small bilateral pleural effusions. Hepatobiliary: 1.0 cm high left hepatic lobe lesion image 18/ series 12003. This is hypoenhancing and relatively well-circumscribed. Not well evaluated on T2 weighted images. Normal gallbladder, without biliary ductal dilatation. Pancreas: suboptimally evaluated, secondary to motion. marked atrophy and duct dilatation within the body, including on image 24/series 3 and image 25/series 9. Soft tissue fullness centered about the pancreatic tail with extension into the splenic hilum. Example of more solid-appearing soft tissue fullness at 3.3 x 2.6 cm on image 48/series 12003. More cystic component (likely representing necrosis) positioned in the splenic hilum measures 2.4 x 2.4 cm on image 48/ series 12003. Spleen: Involve by direct tumor spread, as above. Extension into the splenic parenchyma including on image 47/ series 12003. Adrenals/Urinary Tract: Normal adrenal glands. Renal cortical thinning, especially in the interpolar right kidney. No hydronephrosis. Stomach/Bowel: Grossly normal stomach. No high-grade bowel obstruction identified. There is suggestion of wall thickening involving the distal transverse colon and proximal descending colon. Example image 56 and 58/ series 12003. Vascular/Lymphatic: Advanced aortic and branch vessel atherosclerosis. No retroperitoneal or retrocrural adenopathy. Other:  Small volume ascites. Musculoskeletal: No acute osseous abnormality. IMPRESSION: 1. Moderate to markedly motion degraded exam. 2. In combination with the 05/09/2016 CT findings, tumor extension into the splenic hilum is favored to arise from the pancreatic tail. Secondary involvement of the splenic flexure of the colon. Colonic primary with direct extension into the spleen is felt less likely, given absence of well-defined circumferential colonic mass or high-grade obstruction. 3. Indeterminate left hepatic lobe lesion. Recommend  attention on follow-up. 4. Ascites and bilateral pleural effusions suggests fluid overload. 5. Left-sided colonic wall thickening suggested. Considerations include infectious colitis or low-grade ischemia secondary to direct tumor involvement. Electronically Signed   By: Abigail Miyamoto M.D.   On: 05/16/2016 09:45    Cardiac Studies   TTE: 05/13/16: EF 30-35% with diffuse HF. Mild LA& RV dilation. Trivial TR.  Mod elevation of Pulm pressures ~50 mmHGg   Patient Profile     83 y.o. male with PMH of DM with peripheral neuropathy; HTN; carotid steonsis s/p CEA x 2; BPH; and afib on Eliquis, admitted from [facility] 03/22 w/ new dx colon CA. Has had multiple tests. Initially on IV Diltiazem & PO BB - IV Dilt has been d/c'd  Assessment & Plan    1. Permanent Afib: Rates remained controlled on Iv dilt -more tachycardiac since stopped., on IV heparin for anticoagulation for invasive procedures. Had colonoscopy with a polyp removed, surgery planning Perc Drain - concer is Pancreatic Ca - so may not do Sgx.  -- Increase BB to q 6 hr for better HR control. - still has occasional HR in 110-120 range (may need to establish plan for a PRN dose of lopressor @ d/x). --> Would plan to consolidate to 50 mg PO BID Metoprolol tomorrow if stable. Once procedures complete - can convert from IV Heparin back to home DOAC.  2. Cardiomyopathy: Echo this admission shows reduced EF from 60% in 2015 to 30-35% with diffuse hypokinesis. - no active CHF. - had brief episode of dyspnea o/n - otw stable. ` Question whether this is related to his Afib. Denies any anginal symptoms. This does increase his overall risk for surgical intervention, but does not prevent. Consider further invasive work up once past this acute state.  -- would convert PO Lopressor to Toprol  3. Abd mass: Underwent colonoscopy with polyp removed. GI and surgery following and planning for MRI done yesterday to further evaluate no read as yet; plan is Perc  Drain by IR. ? Pancreatic Ca. - unclear on plans @ this ponit - ? Biopsy seems to be the goal.  4. HTN: controlled   Will follow along on Tele over the weekend, & will be available to assist.  Otherwise, will round again on Monday.   Signed, Glenetta Hew, MD  05/17/2016, 8:42 AM

## 2016-05-17 NOTE — Discharge Summary (Addendum)
Physician Discharge Summary  Benjamin Hurley IRJ:[id number] DOB: February 28, 1925 DOA: 05/09/2016  PCP: No PCP Per Patient  Admit date: 05/09/2016 Discharge date: 05/17/2016  Admitted From: Home  Disposition:  Home   Recommendations for Outpatient Follow-up:  1. Follow up with PCP in 1- week 2. Follow with Gastroenterology as outpatient for endoscopic ultrasound (Dr. Scarlette Shorts and Dr. Ardis Hughs)   Home Health: Yes  Equipment/Devices: Na   Discharge Condition: Stable  CODE STATUS: Full  Diet recommendation: Heart Healthy / Carb Modified (full liquid diet)  Brief/Interim Summary: This is a 83 yo male who presented to the hospital with the chief complain of abdominal pain. Patient had a recent hospitalization due to C. difficile about 2 weeks ago. He also had uncontrolled atrial fibrillation. He developed recurrent nausea and vomiting as well as diarrhea. He was seen at [facility], CT of his abdomen was performed finding a mass in the proximal descending colon with suspected bowel obstruction. Transfer for further evaluation. On his initial physical examination his blood pressure 118/71, heart rate 138, temperature 97.4, oxygen saturation 98%. His oral mucosa was moist, his lungs were clear to auscultation bilaterally, heart S1-S2 present irregularly irregular, tachycardic, abdomen soft but tender to palpation. Sodium 134, potassium 4.8, chloride 104, bicarbonate 22, glucose 85, BUN 18, creatinine 1.01, white count 5.6, hemoglobin 12.5, hematocrit 37.1, platelets 293. Stool was negative for C. difficile.  Patient was admitted to the hospital with working diagnosis of colonic mass rule out malignancy complicated with possible bowel obstruction and atrial fibrillation with rapid ventricular response.  1. Gastrointestinal malignancy, suspected pancreatic cancer. Patient underwent further workup with MRI of the abdomen which showed tumor extension into the splenic hilum favoring primary from pancreatic  tail with secondary involvement of the splenic flexure of the colon, plus indeterminate left hepatic lobe lesion. Recommendations for outpatient endoscopic ultrasound guided biopsy, that will be arranged as an outpatient. CEA 23,2 and CA 19-9 6,576.   2. Atrial fibrillation with rapid ventricular response. Patient was placed on AV blockade with metoprolol with improvement of a heart rate, he will be discharged and 50 mg twice daily of metoprolol. Will resume anticoagulation with apixaban. Patient received heparin intravenously during his hospitalization. Echocardiography with ejection fraction between 30 and 35% on the left ventricle with diffuse hypokinesis. Moderate increased pulmonary pressures, peak at 50 with a mildly dilated right ventricle and mildly reduced systolic function.   3. Chronic and stable systolic heart failure ejection fraction 35%. Suspected nonischemic cardiomyopathy, continue metoprolol for rate control. Holding ACE inhibitor due to risk of hypotension. Patient clinically euvolemic.   4. Type 2 diabetes mellitus. Patient was placed on insulin sliding scale for glucose coverage and monitoring, basal insulin with Levemir 7 units. Capillary glucose 186, 182, 103. Patient at home on a higher dose of Levemir, for now will discharge on current dose with further adjustment as an outpatient, to prevent hypoglycemia. Resume metformin 500 mg twice daily.  5. Protein calorie malnutrition. Moderate. Continue nutritional supplements.   Discharge Diagnoses:  Principal Problem:   Colon cancer ([place]) Active Problems:   Atrial fibrillation with RVR ([facility])   Hypomagnesemia   Diabetes mellitus, type 2 ([place])   Encounter for palliative care   Goals of care, counseling/discussion   Nausea without vomiting   Abnormal CT scan, colon   LUQ abdominal pain   Benign neoplasm of transverse colon   Stricture of colon   Colonic mass   Chronic systolic CHF (congestive heart failure) ([place])  Malnutrition of moderate degree   Abdominal mass, left upper quadrant    Discharge Instructions   Allergies as of 05/17/2016   No Known Allergies     Medication List    STOP taking these medications   diltiazem 120 MG 24 hr capsule Commonly known as:  DILACOR XR   insulin glargine 100 UNIT/ML injection Commonly known as:  LANTUS Replaced by:  insulin aspart 100 UNIT/ML FlexPen     TAKE these medications   acetaminophen 325 MG tablet Commonly known as:  TYLENOL Take 2 tablets (650 mg total) by mouth every 6 (six) hours as needed for moderate pain.   apixaban 5 MG Tabs tablet Commonly known as:  ELIQUIS Take 5 mg by mouth 2 (two) times daily.   carboxymethylcellulose 0.5 % Soln Commonly known as:  REFRESH PLUS Place 1 drop into both eyes 2 (two) times daily.   docusate sodium 100 MG capsule Commonly known as:  COLACE Take 1 capsule (100 mg total) by mouth 2 (two) times daily.   feeding supplement (GLUCERNA SHAKE) Liqd Take 237 mLs by mouth daily.   gabapentin 300 MG capsule Commonly known as:  NEURONTIN Take 300 mg by mouth at bedtime.   insulin aspart 100 UNIT/ML FlexPen Commonly known as:  NOVOLOG FLEXPEN Inject 7 Units into the skin at bedtime. Replaces:  insulin glargine 100 UNIT/ML injection   latanoprost 0.005 % ophthalmic solution Commonly known as:  XALATAN Place 1 drop into both eyes at bedtime.   metFORMIN 500 MG tablet Commonly known as:  GLUCOPHAGE Take 500 mg by mouth 2 (two) times daily with a meal.   metoprolol 50 MG tablet Commonly known as:  LOPRESSOR Take 1 tablet (50 mg total) by mouth 2 (two) times daily.   multivitamin with minerals Tabs tablet Take 1 tablet by mouth daily.   pantoprazole 40 MG tablet Commonly known as:  PROTONIX Take 40 mg by mouth daily.   promethazine 25 MG tablet Commonly known as:  PHENERGAN Take 12.5 mg by mouth every 4 (four) hours as needed for nausea or vomiting.   simethicone 80 MG chewable  tablet Commonly known as:  MYLICON Chew 80 mg by mouth every 6 (six) hours as needed for flatulence.   sucralfate 1 GM/10ML suspension Commonly known as:  CARAFATE Take 1 g by mouth 2 (two) times daily.       No Known Allergies  Consultations:  Surgery  Gastroenterology  Cardiology  Palliative care   Procedures/Studies: Mr Abdomen W Wo Contrast  Result Date: 05/16/2016 CLINICAL DATA:  Outside CT demonstrating mass extending from the descending colon into the inferior splenic hilum with splenic involvement. Diabetes. Hypertension. EXAM: MRI ABDOMEN WITHOUT AND WITH CONTRAST TECHNIQUE: Multiplanar multisequence MR imaging of the abdomen was performed both before and after the administration of intravenous contrast. CONTRAST:  44mL MULTIHANCE GADOBENATE DIMEGLUMINE 529 MG/ML IV SOLN COMPARISON:  Plain films of 05/12/2016.  CT of 05/09/2016 FINDINGS: Moderate to marked motion degradation throughout. Lower chest: Mild cardiomegaly.  Small bilateral pleural effusions. Hepatobiliary: 1.0 cm high left hepatic lobe lesion image 18/ series 12003. This is hypoenhancing and relatively well-circumscribed. Not well evaluated on T2 weighted images. Normal gallbladder, without biliary ductal dilatation. Pancreas: suboptimally evaluated, secondary to motion. marked atrophy and duct dilatation within the body, including on image 24/series 3 and image 25/series 9. Soft tissue fullness centered about the pancreatic tail with extension into the splenic hilum. Example of more solid-appearing soft tissue fullness at 3.3 x 2.6 cm on  image 48/series 12003. More cystic component (likely representing necrosis) positioned in the splenic hilum measures 2.4 x 2.4 cm on image 48/ series 12003. Spleen: Involve by direct tumor spread, as above. Extension into the splenic parenchyma including on image 47/ series 12003. Adrenals/Urinary Tract: Normal adrenal glands. Renal cortical thinning, especially in the interpolar  right kidney. No hydronephrosis. Stomach/Bowel: Grossly normal stomach. No high-grade bowel obstruction identified. There is suggestion of wall thickening involving the distal transverse colon and proximal descending colon. Example image 56 and 58/ series 12003. Vascular/Lymphatic: Advanced aortic and branch vessel atherosclerosis. No retroperitoneal or retrocrural adenopathy. Other:  Small volume ascites. Musculoskeletal: No acute osseous abnormality. IMPRESSION: 1. Moderate to markedly motion degraded exam. 2. In combination with the 05/09/2016 CT findings, tumor extension into the splenic hilum is favored to arise from the pancreatic tail. Secondary involvement of the splenic flexure of the colon. Colonic primary with direct extension into the spleen is felt less likely, given absence of well-defined circumferential colonic mass or high-grade obstruction. 3. Indeterminate left hepatic lobe lesion. Recommend attention on follow-up. 4. Ascites and bilateral pleural effusions suggests fluid overload. 5. Left-sided colonic wall thickening suggested. Considerations include infectious colitis or low-grade ischemia secondary to direct tumor involvement. Electronically Signed   By: Abigail Miyamoto M.D.   On: 05/16/2016 09:45   Dg Abd 2 Views  Result Date: 05/12/2016 CLINICAL DATA:  Colonic mass, abdominal pain EXAM: ABDOMEN - 2 VIEW COMPARISON:  None. FINDINGS: There is mild gaseous distension of small bowel in left mid abdomen suspicious for ileus. Moderate distension of the right colon and proximal transverse colon with gas suspicious for ileus or early colonic obstruction. Nonspecific air-fluid levels are noted within colon. No evidence of free abdominal air. IMPRESSION: There is mild gaseous distension of small bowel in left mid abdomen suspicious for ileus. Moderate distension of the right colon and proximal transverse colon with gas suspicious for ileus or early colonic obstruction. Nonspecific air-fluid levels  are noted within colon. No evidence of free abdominal air. Electronically Signed   By: Lahoma Crocker M.D.   On: 05/12/2016 15:55       Subjective: Patient feeling better, no nausea or vomiting. Pain controlled with acetaminophen. No dyspnea.   Discharge Exam: Vitals:   05/17/16 0548 05/17/16 1019  BP: 125/74 122/71  Pulse: 95 99  Resp: 18 18  Temp: 97.8 F (36.6 C) 97.9 F (36.6 C)   Vitals:   05/16/16 2006 05/17/16 0500 05/17/16 0548 05/17/16 1019  BP: 112/63  125/74 122/71  Pulse: (!) 115  95 99  Resp: 18  18 18   Temp: 98.7 F (37.1 C)  97.8 F (36.6 C) 97.9 F (36.6 C)  TempSrc: Oral  Oral Oral  SpO2: 97%  97% 99%  Weight:  69.4 kg (153 lb)    Height:        General: Pt is alert, awake, not in acute distress Cardiovascular: RRR, S1/S2 +, no rubs, no gallops Respiratory: CTA bilaterally, no wheezing, no rhonchi Abdominal: Soft, NT, ND, bowel sounds + Extremities: no edema, no cyanosis    The results of significant diagnostics from this hospitalization (including imaging, microbiology, ancillary and laboratory) are listed below for reference.     Microbiology: Recent Results (from the past 240 hour(s))  Gastrointestinal Panel by PCR , Stool     Status: None   Collection Time: 05/10/16  3:07 AM  Result Value Ref Range Status   Campylobacter species NOT DETECTED NOT DETECTED Final   Plesimonas  shigelloides NOT DETECTED NOT DETECTED Final   Salmonella species NOT DETECTED NOT DETECTED Final   Yersinia enterocolitica NOT DETECTED NOT DETECTED Final   Vibrio species NOT DETECTED NOT DETECTED Final   Vibrio cholerae NOT DETECTED NOT DETECTED Final   Enteroaggregative E coli (EAEC) NOT DETECTED NOT DETECTED Final   Enteropathogenic E coli (EPEC) NOT DETECTED NOT DETECTED Final   Enterotoxigenic E coli (ETEC) NOT DETECTED NOT DETECTED Final   Shiga like toxin producing E coli (STEC) NOT DETECTED NOT DETECTED Final   Shigella/Enteroinvasive E coli (EIEC) NOT DETECTED  NOT DETECTED Final   Cryptosporidium NOT DETECTED NOT DETECTED Final   Cyclospora cayetanensis NOT DETECTED NOT DETECTED Final   Entamoeba histolytica NOT DETECTED NOT DETECTED Final   Giardia lamblia NOT DETECTED NOT DETECTED Final   Adenovirus F40/41 NOT DETECTED NOT DETECTED Final   Astrovirus NOT DETECTED NOT DETECTED Final   Norovirus GI/GII NOT DETECTED NOT DETECTED Final   Rotavirus A NOT DETECTED NOT DETECTED Final   Sapovirus (I, II, IV, and V) NOT DETECTED NOT DETECTED Final  C difficile quick scan w PCR reflex     Status: None   Collection Time: 05/10/16  3:07 AM  Result Value Ref Range Status   C Diff antigen NEGATIVE NEGATIVE Final   C Diff toxin NEGATIVE NEGATIVE Final   C Diff interpretation No C. difficile detected.  Final     Labs: BNP (last 3 results) No results for input(s): BNP in the last 8760 hours. Basic Metabolic Panel:  Recent Labs Lab 05/10/16 1200 05/15/16 0505 05/17/16 0516  NA 130* 134* 136  K 4.5 4.0 4.0  CL 101 106 106  CO2 21* 22 23  GLUCOSE 226* 118* 130*  BUN 18 <5* 7  CREATININE 1.07 0.79 0.80  CALCIUM 8.5* 8.6* 8.8*   Liver Function Tests:  Recent Labs Lab 05/10/16 1200  AST 31  ALT 14*  ALKPHOS 68  BILITOT 0.7  PROT 6.8  ALBUMIN 3.4*   No results for input(s): LIPASE, AMYLASE in the last 168 hours. No results for input(s): AMMONIA in the last 168 hours. CBC:  Recent Labs Lab 05/13/16 0456 05/14/16 0513 05/15/16 0505 05/16/16 0519 05/17/16 0516  WBC 6.9 6.0 7.9 6.4 5.7  HGB 12.9* 10.8* 11.4* 11.1* 11.3*  HCT 39.4 32.8* 33.9* 32.9* 32.7*  MCV 90.6 92.9 90.9 91.1 90.8  PLT 235 185 211 152 159   Cardiac Enzymes:  Recent Labs Lab 05/10/16 1200  TROPONINI <0.03   BNP: Invalid input(s): POCBNP CBG:  Recent Labs Lab 05/16/16 1220 05/16/16 1635 05/16/16 2114 05/16/16 2351 05/17/16 0739  GLUCAP 117* 125* 186* 182* 103*   D-Dimer No results for input(s): DDIMER in the last 72 hours. Hgb A1c No results  for input(s): HGBA1C in the last 72 hours. Lipid Profile No results for input(s): CHOL, HDL, LDLCALC, TRIG, CHOLHDL, LDLDIRECT in the last 72 hours. Thyroid function studies No results for input(s): TSH, T4TOTAL, T3FREE, THYROIDAB in the last 72 hours.  Invalid input(s): FREET3 Anemia work up No results for input(s): VITAMINB12, FOLATE, FERRITIN, TIBC, IRON, RETICCTPCT in the last 72 hours. Urinalysis No results found for: COLORURINE, APPEARANCEUR, [place], [place], [place], [place], [place], [place], PROTEINUR, UROBILINOGEN, NITRITE, LEUKOCYTESUR Sepsis Labs Invalid input(s): PROCALCITONIN,  WBC,  LACTICIDVEN Microbiology Recent Results (from the past 240 hour(s))  Gastrointestinal Panel by PCR , Stool     Status: None   Collection Time: 05/10/16  3:07 AM  Result Value Ref Range Status   Campylobacter species  NOT DETECTED NOT DETECTED Final   Plesimonas shigelloides NOT DETECTED NOT DETECTED Final   Salmonella species NOT DETECTED NOT DETECTED Final   Yersinia enterocolitica NOT DETECTED NOT DETECTED Final   Vibrio species NOT DETECTED NOT DETECTED Final   Vibrio cholerae NOT DETECTED NOT DETECTED Final   Enteroaggregative E coli (EAEC) NOT DETECTED NOT DETECTED Final   Enteropathogenic E coli (EPEC) NOT DETECTED NOT DETECTED Final   Enterotoxigenic E coli (ETEC) NOT DETECTED NOT DETECTED Final   Shiga like toxin producing E coli (STEC) NOT DETECTED NOT DETECTED Final   Shigella/Enteroinvasive E coli (EIEC) NOT DETECTED NOT DETECTED Final   Cryptosporidium NOT DETECTED NOT DETECTED Final   Cyclospora cayetanensis NOT DETECTED NOT DETECTED Final   Entamoeba histolytica NOT DETECTED NOT DETECTED Final   Giardia lamblia NOT DETECTED NOT DETECTED Final   Adenovirus F40/41 NOT DETECTED NOT DETECTED Final   Astrovirus NOT DETECTED NOT DETECTED Final   Norovirus GI/GII NOT DETECTED NOT DETECTED Final   Rotavirus A NOT DETECTED NOT DETECTED Final   Sapovirus (I, II, IV, and V) NOT  DETECTED NOT DETECTED Final  C difficile quick scan w PCR reflex     Status: None   Collection Time: 05/10/16  3:07 AM  Result Value Ref Range Status   C Diff antigen NEGATIVE NEGATIVE Final   C Diff toxin NEGATIVE NEGATIVE Final   C Diff interpretation No C. difficile detected.  Final     Time coordinating discharge: 45 minutes  SIGNED:   Tawni Millers, MD  [facility] 05/17/2016, 11:11 AM Pager   If 7PM-7AM, please contact night-coverage [email or web address] Password [email or web address]

## 2016-05-17 NOTE — Progress Notes (Signed)
ANTICOAGULATION CONSULT NOTE   Pharmacy Consult for Heparin Indication: atrial fibrillation  No Known Allergies  Patient Measurements: Height: 5\' 6"  (167.6 cm) Weight: 153 lb (69.4 kg) IBW/kg (Calculated) : 63.8 Heparin Dosing Weight: 71.9  Vital Signs: Temp: 97.8 F (36.6 C) (03/30 0548) Temp Source: Oral (03/30 0548) BP: 125/74 (03/30 0548) Pulse Rate: 95 (03/30 0548)  Labs:  Recent Labs  05/15/16 0505 05/16/16 0519 05/17/16 0516  HGB 11.4* 11.1* 11.3*  HCT 33.9* 32.9* 32.7*  PLT 211 152 159  HEPARINUNFRC 0.48 0.34 0.50  CREATININE 0.79  --  0.80    Estimated Creatinine Clearance: 57.6 mL/min (by C-G formula based on SCr of 0.8 mg/dL).  Assessment: 29 yoM admitted with probable colon perforation and mass, in Afib with hx CVA.  On Apixaban 5mg  bid, LD 3/21 at 1700   Today, 05/17/2016:  Heparin level this morning remains therapeutic at 0.5 with infusion at 1000 units/hr  CBC- stable. S/p colonoscopy 3/26 with polyp removed.  Plan EUS as outpatient.  No bleeding issues reported.  Goal of Therapy:  Heparin level 0.3-0.7 units/ml Monitor platelets by anticoagulation protocol: Yes  Plan:   Change Heparin to Apixaban 5mg  PO BID in anticipation of discharge.  Note GI plans to HOLD Apixaban 2 days prior to procedure.   Netta Cedars, PharmD, BCPS Pager: [phone] 05/17/2016 7:28 AM

## 2016-05-17 NOTE — Consult Note (Signed)
   [facility] CM Inpatient Consult   05/17/2016  ANNE SEBRING Nov 08, 1925 [id number]    [facility] Care Management follow up. Telephone call received from inpatient RNCM indicating Mr. Boylen is discharging today and will need [facility] Care Management services.   Spoke with Mr. Nicolini, wife, and daughter, [name], at bedside to discuss and explain [facility] Care Management program as a benefit of his [employer] insurance. Confirmed his Primary Care Provider is with the [place] in [place]. Explained that [facility] will not interfere or replace services provided by home health. Written consent obtained and daughter Gregg Winchell indicates she is the primary care contact person as she is responsible for both of her parents. [name] Mezquita's contact number is [phone]. Patient's wife is hard of hearing.   Palliative consult was done during hospitalization. Please see palliative medicine consult notes. Mr. Lama remains full code and wishes to go home with home health services. Mrs. Teaster (wife) is at home with Mr. Poole. Daughter, [name], lives in the area and handles all of the affairs. They indicate that they are trying to tap into Mr. Harlon Kutner benefits. They endorse that they would need assistance in navigating this.   Discussed referral to [facility] LCSW for community resources regarding applying for Veterans benefits. Discussed [facility] RNCM follow up for transition of care. It was thought that Mr. Vanhook is a high risk for readmission due to his co-morbidites and the like.   Mr. Brodrick is for discharge today. [facility] referrals made.   Marthenia Rolling, MSN-Ed, RN,BSN [facility] Liaison [phone]

## 2016-05-17 NOTE — Care Management Note (Signed)
Case Management Note  Patient Details  Name: Benjamin Hurley MRN: [id number] Date of Birth: 04-21-1925  Subjective/Objective:  [facility] chosen for [employer] aware of d/c & HHC orders. No further CM needs.                  Action/Plan:d/c home w/HHC.   Expected Discharge Date:  05/17/16               Expected Discharge Plan:  Home/Self Care  In-House Referral:     Discharge planning Services  CM Consult  Post Acute Care Choice:    Choice offered to:  Adult Children  DME Arranged:    DME Agency:     HH Arranged:  RN, PT [place] Agency:  [facility]  Status of Service:  Completed, signed off  If discussed at [place] of Stay Meetings, dates discussed:    Additional Comments:  Dessa Phi, RN 05/17/2016, 12:43 PM

## 2016-05-17 NOTE — Telephone Encounter (Signed)
Thanks

## 2016-05-17 NOTE — Evaluation (Signed)
Physical Therapy Evaluation-1x Patient Details Name: EMMA SCHUPP MRN: [id number] DOB: 1925/07/20 Today's Date: 05/17/2016   History of Present Illness  83 yo male admitted with colon cancer, probable pancreatic cancer. Hx of DM, peripheral neuropathy, A fib, CVA  Clinical Impression  On eval, pt required Min assist for mobility. He walked ~100 feet with a RW. He presents with general weakness, decreased activity tolerance, and impaired gait and balance. Family present during session-plan is for pt to return home. Recommend HHPT and 24 hour supervision/assist. Pt is set to d/c later today.     Follow Up Recommendations Home health PT;Supervision/Assistance - 24 hour    Equipment Recommendations  None recommended by PT    Recommendations for Other Services       Precautions / Restrictions Precautions Precautions: Fall Restrictions Weight Bearing Restrictions: No      Mobility  Bed Mobility Overal bed mobility: Modified Independent                Transfers Overall transfer level: Needs assistance Equipment used: Rolling walker (2 wheeled) Transfers: Sit to/from Stand Sit to Stand: Supervision         General transfer comment: for safety, hand placement  Ambulation/Gait Ambulation/Gait assistance: Min guard Ambulation Distance (Feet): 100 Feet Assistive device: Rolling walker (2 wheeled) Gait Pattern/deviations: Step-through pattern;Decreased stride length;Trunk flexed     General Gait Details: cues for safe use of walker, posture. Pt fatigues easily/quickly. 2 brief standing rest breaks  Stairs            Wheelchair Mobility    Modified Rankin (Stroke Patients Only)       Balance Overall balance assessment: Needs assistance           Standing balance-[name]: Poor Standing balance comment: requires RW                             Pertinent Vitals/Pain Pain Assessment: No/denies pain    Home Living Family/patient  expects to be discharged to:: Private residence Living Arrangements: Spouse/significant other Available Help at Discharge: Family Type of Home: House         Home Equipment: Gilford Rile - 2 wheels;Wheelchair - manual      Prior Function Level of Independence: Independent with assistive device(s)         Comments: RW for ambulation     Hand Dominance        Extremity/Trunk Assessment   Upper Extremity Assessment Upper Extremity Assessment: Generalized weakness    Lower Extremity Assessment Lower Extremity Assessment: Generalized weakness    Cervical / Trunk Assessment Cervical / Trunk Assessment: Kyphotic  Communication   Communication: No difficulties  Cognition Arousal/Alertness: Awake/alert Behavior During Therapy: WFL for tasks assessed/performed Overall Cognitive Status: Within Functional Limits for tasks assessed                                        General Comments      Exercises     Assessment/Plan    PT Assessment All further PT needs can be met in the next venue of care (HHPT f/u  (pt set to d/c later today))  PT Problem List Decreased strength;Decreased mobility;Decreased balance;Decreased activity tolerance;Decreased knowledge of use of DME       PT Treatment Interventions Therapeutic activities;Therapeutic exercise;Gait training;DME instruction;Functional mobility training;Patient/family education    PT  Goals (Current goals can be found in the Care Plan section)  Acute Rehab PT Goals Patient Stated Goal: home soon PT Goal Formulation: With patient/family Time For Goal Achievement: 05/31/16 Potential to Achieve Goals: Good    Frequency     Barriers to discharge        Co-evaluation               End of Session   Activity Tolerance: Patient limited by fatigue Patient left: in bed;with call bell/phone within reach;with family/visitor present   PT Visit Diagnosis: Muscle weakness (generalized)  (M62.81);Difficulty in walking, not elsewhere classified (R26.2)    Time: 2233-6122 PT Time Calculation (min) (ACUTE ONLY): 13 min   Charges:   PT Evaluation $PT Eval Low Complexity: 1 Procedure     PT G Codes:         [name], MPT Pager: [phone]

## 2016-05-17 NOTE — Progress Notes (Signed)
Pharmacist Heart Failure Core Measure Documentation  Assessment: Benjamin Hurley has an EF documented as 30-35% on 05/13/16 by ECHO.  Rationale: Heart failure patients with left ventricular systolic dysfunction (LVSD) and an EF < 40% should be prescribed an angiotensin converting enzyme inhibitor (ACEI) or angiotensin receptor blocker (ARB) at discharge unless a contraindication is documented in the medical record.  This patient is not currently on an ACEI or ARB for HF.  This note is being placed in the record in order to provide documentation that a contraindication to the use of these agents is present for this encounter.  ACE Inhibitor or Angiotensin Receptor Blocker is contraindicated (specify all that apply)  []   ACEI allergy AND ARB allergy []   Angioedema []   Moderate or severe aortic stenosis []   Hyperkalemia [x]   Hypotension []   Renal artery stenosis []   Worsening renal function, preexisting renal disease or dysfunction   Biagio Borg 05/17/2016 9:15 AM

## 2016-05-17 NOTE — Telephone Encounter (Signed)
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 IR recommended EUS/FNA as preferable to CT guided biopsy which is reasonable. Please review and schedule if appropriate.   Thanks,  Norberto Sorenson

## 2016-05-17 NOTE — Telephone Encounter (Signed)
Happy to help.  I am out of town until Monday April 9th, but can work towards upper EUS that day.  Benjamin Hurley, He needs upper EUS, radial +/- linear for pancreatic tail mass.  Will need MAC sedation at [facility].  Monday or Tuesday April 9/10.  He is on eliquis and he'll need to hold that for 2 days prior.  THanks  DJ

## 2016-05-20 ENCOUNTER — Other Ambulatory Visit: Payer: Self-pay

## 2016-05-20 ENCOUNTER — Other Ambulatory Visit: Payer: Self-pay | Admitting: *Deleted

## 2016-05-20 DIAGNOSIS — K8689 Other specified diseases of pancreas: Secondary | ICD-10-CM

## 2016-05-20 DIAGNOSIS — I110 Hypertensive heart disease with heart failure: Secondary | ICD-10-CM | POA: Diagnosis not present

## 2016-05-20 DIAGNOSIS — I5022 Chronic systolic (congestive) heart failure: Secondary | ICD-10-CM | POA: Diagnosis not present

## 2016-05-20 DIAGNOSIS — E119 Type 2 diabetes mellitus without complications: Secondary | ICD-10-CM | POA: Diagnosis not present

## 2016-05-20 DIAGNOSIS — C189 Malignant neoplasm of colon, unspecified: Secondary | ICD-10-CM | POA: Diagnosis not present

## 2016-05-20 DIAGNOSIS — Z794 Long term (current) use of insulin: Secondary | ICD-10-CM | POA: Diagnosis not present

## 2016-05-20 DIAGNOSIS — E440 Moderate protein-calorie malnutrition: Secondary | ICD-10-CM | POA: Diagnosis not present

## 2016-05-20 DIAGNOSIS — N400 Enlarged prostate without lower urinary tract symptoms: Secondary | ICD-10-CM | POA: Diagnosis not present

## 2016-05-20 DIAGNOSIS — I4891 Unspecified atrial fibrillation: Secondary | ICD-10-CM | POA: Diagnosis not present

## 2016-05-20 DIAGNOSIS — Z7901 Long term (current) use of anticoagulants: Secondary | ICD-10-CM | POA: Diagnosis not present

## 2016-05-20 NOTE — Telephone Encounter (Signed)
EUS scheduled, pt instructed and medications reviewed.  Patient instructions mailed to home.  Patient to call with any questions or concerns.  Forwarded to Dr Ron Parker for anti coag response.

## 2016-05-20 NOTE — Patient Outreach (Signed)
Referral received for transition of care, pt hospitalized 3/22-3/30/18 abdominal pain with GI malignancy, telephone call to pt with no answer to telephone and received message stating "user busy".  PLAN Attempt to reach pt tomorrow  [name] [facility], [place] Coordinator [phone]

## 2016-05-21 ENCOUNTER — Encounter: Payer: Self-pay | Admitting: Licensed Clinical Social Worker

## 2016-05-21 ENCOUNTER — Other Ambulatory Visit: Payer: Self-pay | Admitting: *Deleted

## 2016-05-21 ENCOUNTER — Encounter: Payer: Self-pay | Admitting: *Deleted

## 2016-05-21 ENCOUNTER — Other Ambulatory Visit: Payer: Self-pay | Admitting: Licensed Clinical Social Worker

## 2016-05-21 NOTE — Patient Outreach (Signed)
Pt hospitalized 3/22-3/30/18 for abdominal pain, has history CHF, A- fib, DM, malnutrition.  Telephone call to pt for transition of care week 1, spoke with pt, HIPAA verified, pt states " I really want you to talk with my wife Stanton Kidney"  RN CM spoke with Stanton Kidney who reports " I can't hear at all over the phone and we want you to talk to daughter Homar Weinkauf at [phone]".  RN CM called and spoke with daughter Jaquavian Firkus who reports she lives 15 minutes away and checks in on her parents but is not at their house at present, [name] states she will make primary care follow up appointment with primary MD at [facility] in [place] Dr. Cleophas Dunker, pt has appointment for endoscopic ultrasound on Monday 05/27/16, Patient's wife and daughter provide transportation, home health RN and PT have both already seen pt and instructed pt on daily weights which pt is doing, CBG yesterday 219 per daughter and " then came down some" pt off novolog due to episodes of hypoglycemia and now is on metformin only.  RN CM reviewed HF action plan with daughter and she will discuss with pt.  Recent weight 147 pounds.  RN CM reviewed medications and daughter does not have medications in front of her, patient's wife prefills medication box and daughter states pt does have everything he is supposed to have.  RN CM faxed barrier letter to primary MD.  [facility] CM Care Plan Problem One     Most Recent Value  Care Plan Problem One  Knowledge deficit related to CHF, GI issues  Role Documenting the Problem One  Care Management Coordinator  Care Plan for Problem One  Active  THN Long Term Goal (31-90 days)  Pt will verbalize better understanding of disease processes (CHF, GI issues) to avoid hospital readmission within 60 days.  THN Long Term Goal Start Date  05/21/16  Interventions for Problem One Long Term Goal  RN CM reviewed all upcoming appointments, daughter is to call and make apointment with primary MD, pt to have endoscopic ultrasound 05/27/16,  reviewed importance of taking medications as prescribed, reviewed drinking glucerna and importance of daily weights  THN CM Short Term Goal #1 (0-30 days)  knowledge deficit related to CHF zones/ action plan  THN CM Short Term Goal #1 Start Date  05/21/16  Interventions for Short Term Goal #1  RN CM reviewed CHF action plan with daughter (who reports home health is working on this with pt and daily weights)  THN CM Short Term Goal #2 (0-30 days)  pt will attend all upcoming GI related appointments, complete needed tests within 30 days.  THN CM Short Term Goal #2 Start Date  05/21/16  Interventions for Short Term Goal #2  RN CM reviewed importance of pt attending all upcoming tests, procedures and working closely with doctor related to GI issues     PLAN Continue weekly transition of care calls See pt for home visit next week  [name] [facility], [place] Coordinator [phone]

## 2016-05-21 NOTE — Patient Outreach (Signed)
Assessment:  CSW received referral on client. CSW completed chart review on client on 05/21/16. CSW spoke via phone with Benjamin Hurley, daughter of client. CSW verified identity of Benjamin Hurley.  Client has completed written consent for [facility] services. Client has given consent for [facility] staff to speak with client's wife and with client's daughter. Client has [facility] [employer]. Client sees medical doctor at [facility] facility in [place].  Client sees Dr. Cleophas Dunker at [facility] in [place], [place]. Client has prescribed medications. Client is receiving in home physical therapy services as scheduled. Client has support from his spouse and from his daughter. Client is receiving [facility] nursing support with RN Benjamin Hurley. CSW and Benjamin Hurley completed needed [facility] assessments for client. CSW and Benjamin Hurley spoke of [place] resources of benefit to client. CSW talked with Benjamin Hurley about eligibility specialist at [facility] and that client could set up appointment for client to meet with a eligibility specialist with the [place]. Client has been diagnosed with a tumor in the area of the entrance to his colon (benign tumor). Benjamin Hurley said client is on a full liquid diet as prescribed.  Benjamin Hurley said that client was not depressed. Client uses a wheelchair to help him with ambulation. Client has fatigue often. Client has lost some weight per Benjamin Hurley.  Benjamin Hurley is very informed regarding client status and current medical needs of client. CSW and Benjamin Hurley spoke of client care plan. CSW encouraged that client communicate with CSW in next 30 days to discuss community resources of assistance for client. Benjamin Hurley said she had spoken via phone with RN Benjamin Hurley on 05/21/16. CSW gave Benjamin Hurley CSW phone number of [phone]. CSW encouraged that client and Benjamin Hurley call [place] at [phone] as needed to discuss social work needs of client.    Plan:  Client to communicate with CSW in next 30 days to discuss community resources of assistance for client.  CSW to  collaborate with RN Benjamin Hurley to monitor needs of client.,  CSW to call client or daughter of client in 2 weeks to assess client needs.  Benjamin Hurley.Benjamin Hurley MSW, LCSW Licensed Clinical Social Worker [facility] Care Management [phone]

## 2016-05-22 ENCOUNTER — Telehealth: Payer: Self-pay | Admitting: Gastroenterology

## 2016-05-22 NOTE — Telephone Encounter (Signed)
Verbal order from the [place] that the pt can stop his Eliquis for 2 days prior to  05/27/16 procedure. Written order will be faxed to our office and scanned into the system.  I have confirmed with the pt's daughter to hold as well

## 2016-05-22 NOTE — Telephone Encounter (Signed)
Dr Harl Bowie ordered an Echo on this pt.  I have sent an anti coag letter to him for a response.

## 2016-05-22 NOTE — Telephone Encounter (Signed)
I spoke with the pt and she states the pt is prescribed Eliquis by an MD at the [place] she will call and try to get a fax number or MD to respond to the anti coag hold.

## 2016-05-23 ENCOUNTER — Encounter (HOSPITAL_COMMUNITY): Payer: Self-pay | Admitting: *Deleted

## 2016-05-23 NOTE — Progress Notes (Signed)
   05/23/16 1309  OBSTRUCTIVE SLEEP APNEA  Have you ever been diagnosed with sleep apnea through a sleep study? No  Do you snore loudly (loud enough to be heard through closed doors)?  1  Do you often feel tired, fatigued, or sleepy during the daytime (such as falling asleep during driving or talking to someone)? 1  Has anyone observed you stop breathing during your sleep? 0  Do you have, or are you being treated for high blood pressure? 1  BMI more than 35 kg/m2? 0  Age > 36 (1-yes) 1  Male Gender (Yes=1) 1  Obstructive Sleep Apnea Score 5  Score 5 or greater  Results sent to PCP

## 2016-05-24 NOTE — Progress Notes (Signed)
Stress test 07-03-15 [facility] on chart

## 2016-05-27 ENCOUNTER — Ambulatory Visit (HOSPITAL_COMMUNITY)
Admission: RE | Admit: 2016-05-27 | Discharge: 2016-05-27 | Disposition: A | Discharge status: Home / Self Care | Payer: PPO | Source: Ambulatory Visit | Attending: Gastroenterology | Admitting: Gastroenterology

## 2016-05-27 ENCOUNTER — Ambulatory Visit (HOSPITAL_COMMUNITY): Payer: PPO | Admitting: Anesthesiology

## 2016-05-27 ENCOUNTER — Telehealth: Payer: Self-pay

## 2016-05-27 ENCOUNTER — Encounter (HOSPITAL_COMMUNITY)
Admission: RE | Disposition: A | Discharge status: Home / Self Care | Payer: Self-pay | Source: Ambulatory Visit | Attending: Gastroenterology

## 2016-05-27 ENCOUNTER — Encounter (HOSPITAL_COMMUNITY): Payer: Self-pay | Admitting: Anesthesiology

## 2016-05-27 DIAGNOSIS — R933 Abnormal findings on diagnostic imaging of other parts of digestive tract: Secondary | ICD-10-CM

## 2016-05-27 DIAGNOSIS — C252 Malignant neoplasm of tail of pancreas: Secondary | ICD-10-CM | POA: Diagnosis not present

## 2016-05-27 DIAGNOSIS — I4891 Unspecified atrial fibrillation: Secondary | ICD-10-CM | POA: Insufficient documentation

## 2016-05-27 DIAGNOSIS — I110 Hypertensive heart disease with heart failure: Secondary | ICD-10-CM | POA: Insufficient documentation

## 2016-05-27 DIAGNOSIS — Z87891 Personal history of nicotine dependence: Secondary | ICD-10-CM | POA: Insufficient documentation

## 2016-05-27 DIAGNOSIS — K219 Gastro-esophageal reflux disease without esophagitis: Secondary | ICD-10-CM | POA: Diagnosis not present

## 2016-05-27 DIAGNOSIS — R1909 Other intra-abdominal and pelvic swelling, mass and lump: Secondary | ICD-10-CM | POA: Diagnosis not present

## 2016-05-27 DIAGNOSIS — K56699 Other intestinal obstruction unspecified as to partial versus complete obstruction: Secondary | ICD-10-CM | POA: Diagnosis not present

## 2016-05-27 DIAGNOSIS — E46 Unspecified protein-calorie malnutrition: Secondary | ICD-10-CM | POA: Diagnosis not present

## 2016-05-27 DIAGNOSIS — K8689 Other specified diseases of pancreas: Secondary | ICD-10-CM | POA: Diagnosis not present

## 2016-05-27 DIAGNOSIS — I5022 Chronic systolic (congestive) heart failure: Secondary | ICD-10-CM | POA: Insufficient documentation

## 2016-05-27 DIAGNOSIS — E440 Moderate protein-calorie malnutrition: Secondary | ICD-10-CM | POA: Insufficient documentation

## 2016-05-27 DIAGNOSIS — R188 Other ascites: Secondary | ICD-10-CM | POA: Diagnosis not present

## 2016-05-27 DIAGNOSIS — D123 Benign neoplasm of transverse colon: Secondary | ICD-10-CM | POA: Insufficient documentation

## 2016-05-27 DIAGNOSIS — E119 Type 2 diabetes mellitus without complications: Secondary | ICD-10-CM | POA: Diagnosis not present

## 2016-05-27 DIAGNOSIS — J90 Pleural effusion, not elsewhere classified: Secondary | ICD-10-CM | POA: Diagnosis not present

## 2016-05-27 DIAGNOSIS — C801 Malignant (primary) neoplasm, unspecified: Secondary | ICD-10-CM | POA: Diagnosis not present

## 2016-05-27 DIAGNOSIS — R110 Nausea: Secondary | ICD-10-CM | POA: Diagnosis not present

## 2016-05-27 HISTORY — DX: Gastro-esophageal reflux disease without esophagitis: K21.9

## 2016-05-27 HISTORY — PX: EUS: SHX5427

## 2016-05-27 HISTORY — DX: Headache: R51

## 2016-05-27 HISTORY — DX: Unspecified glaucoma: H40.9

## 2016-05-27 HISTORY — DX: Unspecified osteoarthritis, unspecified site: M19.90

## 2016-05-27 HISTORY — DX: Headache, unspecified: R51.9

## 2016-05-27 HISTORY — DX: Cerebral infarction, unspecified: I63.9

## 2016-05-27 HISTORY — DX: Heart failure, unspecified: I50.9

## 2016-05-27 HISTORY — DX: Malignant (primary) neoplasm, unspecified: C80.1

## 2016-05-27 LAB — GLUCOSE, CAPILLARY: Glucose-Capillary: 126 mg/dL — ABNORMAL HIGH (ref 65–99)

## 2016-05-27 SURGERY — UPPER ENDOSCOPIC ULTRASOUND (EUS) LINEAR
Anesthesia: Monitor Anesthesia Care

## 2016-05-27 MED ORDER — PROPOFOL 10 MG/ML IV BOLUS
INTRAVENOUS | Status: AC
  Filled 2016-05-27: qty 40

## 2016-05-27 MED ORDER — SODIUM CHLORIDE 0.9 % IV SOLN: INTRAVENOUS | Status: DC

## 2016-05-27 MED ORDER — GLYCOPYRROLATE 0.2 MG/ML IJ SOLN
INTRAMUSCULAR | Status: DC | PRN
  Administered 2016-05-27: 0.2 mg via INTRAVENOUS

## 2016-05-27 MED ORDER — ONDANSETRON HCL 4 MG/2ML IJ SOLN
INTRAMUSCULAR | Status: AC
  Filled 2016-05-27: qty 4

## 2016-05-27 MED ORDER — ONDANSETRON HCL 4 MG/2ML IJ SOLN
INTRAMUSCULAR | Status: DC | PRN
  Administered 2016-05-27: 4 mg via INTRAVENOUS

## 2016-05-27 MED ORDER — LACTATED RINGERS IV SOLN
INTRAVENOUS | Status: DC
  Administered 2016-05-27: 1000 mL via INTRAVENOUS
  Administered 2016-05-27: 09:00:00 via INTRAVENOUS

## 2016-05-27 MED ORDER — PHENYLEPHRINE HCL 10 MG/ML IJ SOLN
INTRAVENOUS | Status: DC | PRN
  Administered 2016-05-27: 20 ug/min via INTRAVENOUS

## 2016-05-27 MED ORDER — PROPOFOL 500 MG/50ML IV EMUL
INTRAVENOUS | Status: DC | PRN
  Administered 2016-05-27: 40 mg via INTRAVENOUS

## 2016-05-27 MED ORDER — PROPOFOL 500 MG/50ML IV EMUL
INTRAVENOUS | Status: DC | PRN
  Administered 2016-05-27: 75 ug/kg/min via INTRAVENOUS

## 2016-05-27 MED ORDER — GLYCOPYRROLATE 0.2 MG/ML IV SOSY
PREFILLED_SYRINGE | INTRAVENOUS | Status: AC
  Filled 2016-05-27: qty 5

## 2016-05-27 NOTE — Anesthesia Postprocedure Evaluation (Signed)
Anesthesia Post Note  Patient: Benjamin Hurley  Procedure(s) Performed: Procedure(s) (LRB): UPPER ENDOSCOPIC ULTRASOUND (EUS) LINEAR (N/A)  Patient location during evaluation: PACU Anesthesia Type: MAC Level of consciousness: awake and alert Pain management: pain level controlled Vital Signs Assessment: post-procedure vital signs reviewed and stable Respiratory status: spontaneous breathing, nonlabored ventilation, respiratory function stable and patient connected to nasal cannula oxygen Cardiovascular status: stable and blood pressure returned to baseline Anesthetic complications: no       Last Vitals:  Vitals:   05/27/16 1025 05/27/16 1030  BP:  (!) 148/77  Pulse: 87 (!) 121  Resp: 15 17  Temp:      Last Pain:  Vitals:   05/27/16 0839  TempSrc: Oral                 [name] DAVID

## 2016-05-27 NOTE — Interval H&P Note (Signed)
History and Physical Interval Note:  05/27/2016 8:20 AM  Benjamin Hurley  has presented today for surgery, with the diagnosis of pancreatic tail mass   The various methods of treatment have been discussed with the patient and family. After consideration of risks, benefits and other options for treatment, the patient has consented to  Procedure(s): UPPER ENDOSCOPIC ULTRASOUND (EUS) LINEAR (N/A) as a surgical intervention .  The patient's history has been reviewed, patient examined, no change in status, stable for surgery.  I have reviewed the patient's chart and labs.  Questions were answered to the patient's satisfaction.     Milus Banister

## 2016-05-27 NOTE — Anesthesia Preprocedure Evaluation (Signed)
Anesthesia Evaluation  Patient identified by MRN, date of birth, ID band Patient awake    Reviewed: Allergy & Precautions, NPO status , Patient's Chart, lab work & pertinent test results  Airway Mallampati: I  TM Distance: >3 FB Neck ROM: Full    Dental   Pulmonary former smoker,    Pulmonary exam normal        Cardiovascular hypertension, Normal cardiovascular exam+ dysrhythmias Atrial Fibrillation      Neuro/Psych    GI/Hepatic GERD  Medicated and Controlled,  Endo/Other  diabetes, Type 2, Oral Hypoglycemic Agents  Renal/GU      Musculoskeletal   Abdominal   Peds  Hematology   Anesthesia Other Findings   Reproductive/Obstetrics                             Anesthesia Physical Anesthesia Plan  ASA: III  Anesthesia Plan: MAC   Post-op Pain Management:    Induction: Intravenous  Airway Management Planned: Simple Face Mask  Additional Equipment:   Intra-op Plan:   Post-operative Plan:   Informed Consent: I have reviewed the patients History and Physical, chart, labs and discussed the procedure including the risks, benefits and alternatives for the proposed anesthesia with the patient or authorized representative who has indicated his/her understanding and acceptance.     Plan Discussed with: CRNA and Surgeon  Anesthesia Plan Comments:         Anesthesia Quick Evaluation

## 2016-05-27 NOTE — H&P (View-Only) (Signed)
[facility] Progress Note  Chief Complaint:   Intraabdominal lesion, left sided abdominal pain  Subjective: Still with intermittent left sided pain. He is happy with liquids but misses solids.   Objective:  Vital signs in last 24 hours: Temp:  [97.9 F (36.6 C)-98.7 F (37.1 C)] 98.2 F (36.8 C) (03/29 1506) Pulse Rate:  [42-136] 42 (03/29 1506) Resp:  [18-19] 18 (03/29 1506) BP: (117-151)/(75-110) 151/110 (03/29 1506) SpO2:  [96 %-100 %] 100 % (03/29 1506) Weight:  [151 lb 14.4 oz (68.9 kg)-154 lb (69.9 kg)] 151 lb 14.4 oz (68.9 kg) (03/29 0500) Last BM Date: 05/14/16 General:   Alert, well-developed white male  in NAD EENT:  Normal hearing, non icteric sclera, conjunctive pink.  Heart:  Regular rate and rhythm Pulm: Normal respiratory effort, lungs Abdomen:  Soft, nondistended, mild left mid / lateral tenderness.  Normal bowel sounds, no masses felt.   Neurologic:  Alert and  oriented x4;  grossly normal neurologically. Psych:  Alert and cooperative. Normal mood and affect.   Intake/Output from previous day: 03/28 0701 - 03/29 0700 In: 2044 [P.O.:600; I.V.:1444] Out: -  Intake/Output this shift: No intake/output data recorded.  Lab Results:  Recent Labs  05/14/16 0513 05/15/16 0505 05/16/16 0519  WBC 6.0 7.9 6.4  HGB 10.8* 11.4* 11.1*  HCT 32.8* 33.9* 32.9*  PLT 185 211 152   BMET  Recent Labs  05/15/16 0505  NA 134*  K 4.0  CL 106  CO2 22  GLUCOSE 118*  BUN <5*  CREATININE 0.79  CALCIUM 8.6*     Mr Abdomen W Wo Contrast  Result Date: 05/16/2016 CLINICAL DATA:  Outside CT demonstrating mass extending from the descending colon into the inferior splenic hilum with splenic involvement. Diabetes. Hypertension. EXAM: MRI ABDOMEN WITHOUT AND WITH CONTRAST TECHNIQUE: Multiplanar multisequence MR imaging of the abdomen was performed both before and after the administration of intravenous contrast. CONTRAST:  12mL MULTIHANCE GADOBENATE  DIMEGLUMINE 529 MG/ML IV SOLN COMPARISON:  Plain films of 05/12/2016.  CT of 05/09/2016 FINDINGS: Moderate to marked motion degradation throughout. Lower chest: Mild cardiomegaly.  Small bilateral pleural effusions. Hepatobiliary: 1.0 cm high left hepatic lobe lesion image 18/ series 12003. This is hypoenhancing and relatively well-circumscribed. Not well evaluated on T2 weighted images. Normal gallbladder, without biliary ductal dilatation. Pancreas: suboptimally evaluated, secondary to motion. marked atrophy and duct dilatation within the body, including on image 24/series 3 and image 25/series 9. Soft tissue fullness centered about the pancreatic tail with extension into the splenic hilum. Example of more solid-appearing soft tissue fullness at 3.3 x 2.6 cm on image 48/series 12003. More cystic component (likely representing necrosis) positioned in the splenic hilum measures 2.4 x 2.4 cm on image 48/ series 12003. Spleen: Involve by direct tumor spread, as above. Extension into the splenic parenchyma including on image 47/ series 12003. Adrenals/Urinary Tract: Normal adrenal glands. Renal cortical thinning, especially in the interpolar right kidney. No hydronephrosis. Stomach/Bowel: Grossly normal stomach. No high-grade bowel obstruction identified. There is suggestion of wall thickening involving the distal transverse colon and proximal descending colon. Example image 56 and 58/ series 12003. Vascular/Lymphatic: Advanced aortic and branch vessel atherosclerosis. No retroperitoneal or retrocrural adenopathy. Other:  Small volume ascites. Musculoskeletal: No acute osseous abnormality. IMPRESSION: 1. Moderate to markedly motion degraded exam. 2. In combination with the 05/09/2016 CT findings, tumor extension into the splenic hilum is favored to arise from the pancreatic tail. Secondary involvement of the splenic flexure of the colon.  Colonic primary with direct extension into the spleen is felt less likely, given  absence of well-defined circumferential colonic mass or high-grade obstruction. 3. Indeterminate left hepatic lobe lesion. Recommend attention on follow-up. 4. Ascites and bilateral pleural effusions suggests fluid overload. 5. Left-sided colonic wall thickening suggested. Considerations include infectious colitis or low-grade ischemia secondary to direct tumor involvement. Electronically Signed   By: Abigail Miyamoto M.D.   On: 05/16/2016 09:45    Assessment / Plan:  83 yo male with a large lesion favored to be arising from pancreatic tail with involvement of splenic flexure and spleen . Colonoscopy by [place] this admission was negative for mass, just benign colonic stricture. Surgery following but decision has been made not to pursue surgery. IR evaluated for image guided biopsy of lesion but Dr. Barbie Banner feels EUS with FNA would be preferable. Unfortunately Dr. Ardis Hughs (who does EUS) is out of town all next week. I have placed a call to Dr. Wonda Horner with [facility] to see if he could do the EUS- waiting on call back. I updated patient and wife.  If pain controlled and medically stable patient can have the EUS as outpatient.   Principal Problem:   Colon cancer ([place]) Active Problems:   Atrial fibrillation with RVR (HCC)   Hypomagnesemia   Diabetes mellitus, type 2 ([place])   Encounter for palliative care   Goals of care, counseling/discussion   Nausea without vomiting   Abnormal CT scan, colon   LUQ abdominal pain   Benign neoplasm of transverse colon   Stricture of colon   Colonic mass   Chronic systolic CHF (congestive heart failure) (HCC)   Malnutrition of moderate degree    LOS: 7 days   Tye Savoy NP 05/16/2016, 4:08 PM  Pager number [phone]     Attending physician's note   I have taken an interval history, reviewed the chart and examined the patient. I agree with the Advanced Practitioner's note, impression and recommendations.  MRI reviewed which is concerning for a pancreatic  tail tumor extending into the splenic hilum, likely involving the colon leading to his colonic stricture. CEA = 23.2, CA19-9 = 6,576. IR feels EUS with FNA would be the preferable next step for a tissue diagnosis.  We will arrange an outpatient EUS and we will contact the patient at home with an EUS appt. If his pain is controlled he can be discharged with plans for an outpatient EUS. Advance to a full liquid diet and he can go home on this diet if he prefers. Colace bid at home for colonic stricture. GI follow up with Dr. Scarlette Shorts. EUS likely with Dr. Ardis Hughs, to be scheduled. GI signing off.   Lucio Edward, MD [facility] [phone] Mon-Fri 8a-5p [phone] after 5p, weekends, holidays

## 2016-05-27 NOTE — Op Note (Signed)
[facility] Patient Name: Benjamin Hurley Procedure Date: 05/27/2016 MRN: [id number] Attending MD: Milus Banister , MD Date of Birth: [birth year] CSN: [id number] Age: 83 Admit Type: Outpatient Procedure:                Upper EUS Indications:              Suspected mass in pancreas on MRI, also abnormal                            LUQ on recent CT scan; felt initially to be colon                            tumor; s/p colonoscopy Dr. Fuller Plan finding narrowing                            in region of splenic flexure without mucosal tumor.                            30 pound weight loss. CA 19-9 6,000 Providers:                Milus Banister, MD, Dortha Schwalbe RN, RN, Alfonso Patten, Technician, Arnoldo Hooker, CRNA Referring MD:              Medicines:                Monitored Anesthesia Care Complications:            No immediate complications. Estimated blood loss:                            None. Estimated Blood Loss:     Estimated blood loss: none. Procedure:                Pre-Anesthesia Assessment:                           - Prior to the procedure, a History and Physical                            was performed, and patient medications and                            allergies were reviewed. The patient's tolerance of                            previous anesthesia was also reviewed. The risks                            and benefits of the procedure and the sedation                            options and risks were discussed with the patient.  All questions were answered, and informed consent                            was obtained. Prior Anticoagulants: The patient has                            taken Eliquis (apixaban), last dose was 3 days                            prior to procedure. ASA Grade Assessment: III - A                            patient with severe systemic disease. After                            reviewing  the risks and benefits, the patient was                            deemed in satisfactory condition to undergo the                            procedure.                           After obtaining informed consent, the endoscope was                            passed under direct vision. Throughout the                            procedure, the patient's blood pressure, pulse, and                            oxygen saturations were monitored continuously. The                            [id number] ([id number]) scope was introduced through                            the mouth, and advanced to the second part of                            duodenum. The [id number] ([id number]) scope was                            introduced through the mouth, and advanced to the                            second part of duodenum. The upper EUS was                            accomplished without difficulty. The patient  tolerated the procedure well. Scope In: Scope Out: Findings:      Endoscopic Finding :      The examined esophagus was endoscopically normal.      The pyloric channel was edematous, slightly narrowed (benign appearing)       but this did not obstruct passage of scopes.      The examined duodenum was endoscopically normal.      Endosonographic Finding :      1. In the region of the left upper quandrant, pancreatic tail there was       signficant ultrasound artifact due to heavily calcified blood vessels       and air filled bowel. There was an irregular soft tissue mass was       identified in this region. I cannot say with any certainty the organ of       origin given the artifact described above. The mass was heterogenous.       The mass measured at least 18 mm in maximal cross-sectional diameter.       The endosonographic borders were poorly-defined. Fine needle aspiration       for cytology was performed. Color Doppler imaging was utilized prior to       needle puncture to  confirm a lack of significant vascular structures       within the needle path. Two passes were made with the 25 gauge needle       using a transgastric approach. A stylet was used. A cytotechnologist was       present to evaluate the adequacy of the specimen.      2. The main pancreatic duct was slightly dilated throughout neck and       body (69mm) but was normal in the head.      3. The pancreatic parenchyma which was well visualized (head, neck,       body) contained no masses.      4. CBD was normal, non-dilated.      5. Limited views of liver, spleen were normal. Impression:               - Significant [place] artifact due to heavily calcified                            blood vessels and air filled bowel limited the                            examination of the LUQ, tail of pancreas lesion                            that was described on recent MRI however I was able                            to see a poorly defined 1.8cm soft tissue mass in                            the region and sampled it with transgastric FNA.                            Preliminary cytology is suspicious for malignancy,  awaiting final readings. Moderate Sedation:      N/A- Per Anesthesia Care Recommendation:           - Discharge patient to home (ambulatory).                           - Await cytology results.                           - Will begin referral process to medical oncology. Procedure Code(s):        --- Professional ---                           [phone], Esophagogastroduodenoscopy, flexible,                            transoral; with transendoscopic ultrasound-guided                            intramural or transmural fine needle                            aspiration/biopsy(s), (includes endoscopic                            ultrasound examination limited to the esophagus,                            stomach or duodenum, and adjacent structures) Diagnosis Code(s):        ---  Professional ---                           K86.89, Other specified diseases of pancreas                           R93.3, Abnormal findings on diagnostic imaging of                            other parts of digestive tract CPT copyright 2016 [employer]. All rights reserved. The codes documented in this report are preliminary and upon coder review may  be revised to meet current compliance requirements. Milus Banister, MD 05/27/2016 10:15:29 AM This report has been signed electronically. Number of Addenda: 0

## 2016-05-27 NOTE — Discharge Instructions (Signed)

## 2016-05-27 NOTE — Telephone Encounter (Signed)
-----   Message from Milus Banister, MD sent at 05/27/2016 10:16 AM EDT ----- All, I just completed EUS FNA; Significant [place] artifact due to heavily calcified blood vessels and air filled bowel limited the examination of the LUQ, tail of pancreas lesion that was described on recent MRI however I was able to see a poorly defined 1.8cm soft tissue mass in the region and sampled it with transgastric FNA. Preliminary cytology is suspicious for malignancy, awaiting final readings.   I will begin referral process to medical oncology.  Thanks   Angelyne Terwilliger, He needs referral to medical oncology for (likely) pancreatic tail mass. Thanks  DJ

## 2016-05-27 NOTE — Telephone Encounter (Signed)
Referral has been made to med oncology

## 2016-05-27 NOTE — Transfer of Care (Signed)
Immediate Anesthesia Transfer of Care Note  Patient: Benjamin Hurley  Procedure(s) Performed: Procedure(s): UPPER ENDOSCOPIC ULTRASOUND (EUS) LINEAR (N/A)  Patient Location: PACU  Anesthesia Type:MAC  Level of Consciousness:  sedated, patient cooperative and responds to stimulation  Airway & Oxygen Therapy:Patient Spontanous Breathing and Patient connected to face mask oxgen  Post-op Assessment:  Report given to PACU RN and Post -op Vital signs reviewed and stable  Post vital signs:  Reviewed and stable  Last Vitals:  Vitals:   05/27/16 0839  BP: (!) 165/86  Resp: 17  Temp: 57.2 C    Complications: No apparent anesthesia complications

## 2016-05-28 ENCOUNTER — Encounter (HOSPITAL_COMMUNITY): Payer: Self-pay | Admitting: Gastroenterology

## 2016-05-30 ENCOUNTER — Encounter: Payer: Self-pay | Admitting: *Deleted

## 2016-05-30 ENCOUNTER — Other Ambulatory Visit: Payer: Self-pay | Admitting: *Deleted

## 2016-05-30 NOTE — Patient Outreach (Signed)
[facility]) Care Management   05/30/2016  KEYTON BHAT [birth year] [id number]  Benjamin Hurley is an 83 y.o. male  Subjective: Initial home visit with pt, HIPAA verified, wife present, pt reports he is still waiting " on all the answers about my cancer of the pancreas"  Pt to see primary MD at [facility] in [place] tomorrow.  Pt to follow up with Dr. Ardis Hughs and oncologist (pt states he has the name of oncologist and address but not in front of him).  Pt reports he is cancelling home health because he feels 25$ copay is too much to pay and will add up quickly.  Pt feels he is not able to complete physical therapy at present due to being weak, pt reports he uses walker and wheelchair. Pt checks CBG BID. Pt drinking glucerna and is on liquid diet at present.  Pt has scales but does not weigh daily.  Objective:   Vitals:   05/30/16 1415  BP: 116/60  Pulse: 85  Resp: 18  SpO2: 97%  Weight: 157 lb (71.2 kg)  Height: 1.651 m (5\' 5" )  CBG 7 day average 170 30 day average 155  ROS  Physical Exam  Constitutional: He is oriented to person, place, and time. He appears well-developed.  HENT:  Head: Normocephalic.  Neck: Normal range of motion. Neck supple.  Cardiovascular: Normal rate.   Irregular rhythym  Respiratory: Effort normal and breath sounds normal.  dyspnea with exertion  GI: Soft. Bowel sounds are normal.  Musculoskeletal: Normal range of motion. He exhibits edema.  2+ edema lower extremies bil  Neurological: He is alert and oriented to person, place, and time.  Skin: Skin is warm and dry.  Psychiatric: He has a normal mood and affect. His behavior is normal. Judgment and thought content normal.    Encounter Medications:   Outpatient Encounter Prescriptions as of 05/30/2016  Medication Sig Note  . acetaminophen (TYLENOL) 325 MG tablet Take 2 tablets (650 mg total) by mouth every 6 (six) hours as needed for moderate pain.   Marland Kitchen acyclovir ointment (ZOVIRAX) 5 %  Apply 1 application topically 3 (three) times daily as needed.   Marland Kitchen apixaban (ELIQUIS) 5 MG TABS tablet Take 5 mg by mouth 2 (two) times daily. 05/27/2016: Last dose was Friday 05/24/16 brt, rn  . carboxymethylcellulose (REFRESH PLUS) 0.5 % SOLN Place 1 drop into both eyes 2 (two) times daily.   Marland Kitchen docusate sodium (COLACE) 100 MG capsule Take 1 capsule (100 mg total) by mouth 2 (two) times daily.   . feeding supplement, GLUCERNA SHAKE, (GLUCERNA SHAKE) LIQD Take 237 mLs by mouth daily.   Marland Kitchen gabapentin (NEURONTIN) 100 MG capsule Take 300 mg by mouth at bedtime.   Marland Kitchen latanoprost (XALATAN) 0.005 % ophthalmic solution Place 1 drop into both eyes at bedtime.   . metFORMIN (GLUCOPHAGE) 500 MG tablet Take 500 mg by mouth 2 (two) times daily with a meal.   . metoprolol (LOPRESSOR) 50 MG tablet Take 1 tablet (50 mg total) by mouth 2 (two) times daily.   . Multiple Vitamin (MULTIVITAMIN WITH MINERALS) TABS tablet Take 1 tablet by mouth daily.   . pantoprazole (PROTONIX) 40 MG tablet Take 40 mg by mouth daily.   . promethazine (PHENERGAN) 25 MG tablet Take 12.5 mg by mouth every 4 (four) hours as needed for nausea or vomiting.   . selenium sulfide (SELSUN) 2.5 % shampoo Apply 1 application topically daily as needed for itching (Itchy scalp).   Marland Kitchen  simethicone (MYLICON) 80 MG chewable tablet Chew 160 mg by mouth 3 (three) times daily.    . sucralfate (CARAFATE) 1 GM/10ML suspension Take 1 g by mouth 2 (two) times daily.    No facility-administered encounter medications on file as of 05/30/2016.     Functional Status:   In your present state of health, do you have any difficulty performing the following activities: 05/30/2016 05/21/2016  Hearing? Tempie Donning  Vision? N N  Difficulty concentrating or making decisions? N N  Walking or climbing stairs? Y Y  Dressing or bathing? N N  Doing errands, shopping? Tempie Donning  Preparing Food and eating ? Y Y  Using the Toilet? N N  In the past six months, have you accidently leaked urine?  N N  Do you have problems with loss of bowel control? N N  Managing your Medications? Y Y  Managing your Finances? Tempie Donning  Housekeeping or managing your Housekeeping? Y Y  Some recent data might be hidden    Fall/Depression Screening:    PHQ 2/9 Scores 05/30/2016 05/21/2016  PHQ - 2 Score 0 0   Fall Risk  05/30/2016 05/21/2016  Falls in the past year? Yes Yes  Number falls in past yr: 1 1  Injury with Fall? No No  Risk for fall due to : Impaired balance/gait;Impaired mobility Impaired balance/gait;Impaired mobility  Follow up Falls evaluation completed Falls prevention discussed    Assessment:  RN CM reviewed safety precautions with pt and wife (see care plan), observed all medications and reviewed with wife as she oversees medications.  Patient's children assist with transportation.  Pt currently on liquid diet and is drinking glucerna.   RN CM emphasized action plan with pt and wife.  Reviewed EMMI handouts related to HF, Living with A-Fib, malnutrition, reviewed [facility] calendar and resources.  RN CM faxed initial home visit and barrier letter to primary MD Dr. Cleophas Dunker.  THN CM Care Plan Problem One     Most Recent Value  Care Plan Problem One  Knowledge deficit related to CHF, GI issues  Role Documenting the Problem One  Care Management Coordinator  Care Plan for Problem One  Active  THN Long Term Goal (31-90 days)  Pt will verbalize better understanding of disease processes (CHF, GI issues) to avoid hospital readmission within 60 days.  THN Long Term Goal Start Date  05/21/16  Interventions for Problem One Long Term Goal  RN CM reviewed all upcoming appointments, reviewed all medications with wife  THN CM Short Term Goal #1 (0-30 days)  knowledge deficit related to CHF zones/ action plan  THN CM Short Term Goal #1 Start Date  05/21/16  Interventions for Short Term Goal #1  RN CM reinforced CHF action plan with patient and wife, reviewed importance of daily weights and recording in [facility]  calendar  THN CM Short Term Goal #2 (0-30 days)  pt will attend all upcoming GI related appointments, complete needed tests within 30 days.  THN CM Short Term Goal #2 Start Date  05/21/16  Interventions for Short Term Goal #2  RN CM reviewed importance of pt attending all upcoming tests, procedures and working closely with doctor related to GI issues    [facility] CM Care Plan Problem Two     Most Recent Value  Care Plan Problem Two  High risk for falls  Role Documenting the Problem Two  Care Management [place] for Problem Two  Active  THN CM Short Term  Goal #1 (0-30 days)  Pt and wife will verbalize safety precautions within 30 days  THN CM Short Term Goal #1 Start Date  05/31/16  Interventions for Short Term Goal #2   RN CM reviewed safety precautions, importance of keeping pathways clear, using walker and wheelchair safely, ask pt to always ask for assistnace as needed        Plan: see pt for home visit next month Continue weekly transition of week [facility], [place] Coordinator [phone]

## 2016-05-31 ENCOUNTER — Encounter: Payer: Self-pay | Admitting: *Deleted

## 2016-06-02 ENCOUNTER — Emergency Department (HOSPITAL_COMMUNITY): Payer: PPO

## 2016-06-02 ENCOUNTER — Inpatient Hospital Stay (HOSPITAL_COMMUNITY)
Admission: EM | Admit: 2016-06-02 | Discharge: 2016-06-05 | DRG: 291 | Disposition: A | Discharge status: Home / Self Care | Payer: PPO | Attending: Internal Medicine | Admitting: Internal Medicine

## 2016-06-02 ENCOUNTER — Encounter (HOSPITAL_COMMUNITY): Payer: Self-pay | Admitting: Emergency Medicine

## 2016-06-02 DIAGNOSIS — I482 Chronic atrial fibrillation: Secondary | ICD-10-CM | POA: Diagnosis not present

## 2016-06-02 DIAGNOSIS — J9811 Atelectasis: Secondary | ICD-10-CM | POA: Diagnosis present

## 2016-06-02 DIAGNOSIS — Z8673 Personal history of transient ischemic attack (TIA), and cerebral infarction without residual deficits: Secondary | ICD-10-CM

## 2016-06-02 DIAGNOSIS — I4891 Unspecified atrial fibrillation: Secondary | ICD-10-CM | POA: Diagnosis present

## 2016-06-02 DIAGNOSIS — E43 Unspecified severe protein-calorie malnutrition: Secondary | ICD-10-CM | POA: Diagnosis not present

## 2016-06-02 DIAGNOSIS — K8689 Other specified diseases of pancreas: Secondary | ICD-10-CM | POA: Diagnosis present

## 2016-06-02 DIAGNOSIS — I5022 Chronic systolic (congestive) heart failure: Secondary | ICD-10-CM | POA: Diagnosis not present

## 2016-06-02 DIAGNOSIS — Z66 Do not resuscitate: Secondary | ICD-10-CM | POA: Diagnosis not present

## 2016-06-02 DIAGNOSIS — R0602 Shortness of breath: Secondary | ICD-10-CM

## 2016-06-02 DIAGNOSIS — I509 Heart failure, unspecified: Secondary | ICD-10-CM | POA: Diagnosis not present

## 2016-06-02 DIAGNOSIS — Z794 Long term (current) use of insulin: Secondary | ICD-10-CM

## 2016-06-02 DIAGNOSIS — Z87891 Personal history of nicotine dependence: Secondary | ICD-10-CM

## 2016-06-02 DIAGNOSIS — Z7901 Long term (current) use of anticoagulants: Secondary | ICD-10-CM

## 2016-06-02 DIAGNOSIS — I10 Essential (primary) hypertension: Secondary | ICD-10-CM | POA: Diagnosis not present

## 2016-06-02 DIAGNOSIS — I110 Hypertensive heart disease with heart failure: Principal | ICD-10-CM | POA: Diagnosis present

## 2016-06-02 DIAGNOSIS — H409 Unspecified glaucoma: Secondary | ICD-10-CM | POA: Diagnosis present

## 2016-06-02 DIAGNOSIS — Z7984 Long term (current) use of oral hypoglycemic drugs: Secondary | ICD-10-CM

## 2016-06-02 DIAGNOSIS — K219 Gastro-esophageal reflux disease without esophagitis: Secondary | ICD-10-CM | POA: Diagnosis not present

## 2016-06-02 DIAGNOSIS — I5023 Acute on chronic systolic (congestive) heart failure: Secondary | ICD-10-CM | POA: Diagnosis not present

## 2016-06-02 DIAGNOSIS — Z6823 Body mass index (BMI) 23.0-23.9, adult: Secondary | ICD-10-CM | POA: Diagnosis not present

## 2016-06-02 DIAGNOSIS — C259 Malignant neoplasm of pancreas, unspecified: Secondary | ICD-10-CM | POA: Diagnosis present

## 2016-06-02 DIAGNOSIS — Z79899 Other long term (current) drug therapy: Secondary | ICD-10-CM | POA: Diagnosis not present

## 2016-06-02 DIAGNOSIS — E1159 Type 2 diabetes mellitus with other circulatory complications: Secondary | ICD-10-CM | POA: Diagnosis not present

## 2016-06-02 DIAGNOSIS — E119 Type 2 diabetes mellitus without complications: Secondary | ICD-10-CM

## 2016-06-02 DIAGNOSIS — K869 Disease of pancreas, unspecified: Secondary | ICD-10-CM | POA: Diagnosis not present

## 2016-06-02 DIAGNOSIS — E1142 Type 2 diabetes mellitus with diabetic polyneuropathy: Secondary | ICD-10-CM | POA: Diagnosis present

## 2016-06-02 LAB — COMPREHENSIVE METABOLIC PANEL
ALT: 13 U/L — AB (ref 17–63)
AST: 22 U/L (ref 15–41)
Albumin: 3.6 g/dL (ref 3.5–5.0)
Alkaline Phosphatase: 56 U/L (ref 38–126)
Anion gap: 9 (ref 5–15)
BUN: 11 mg/dL (ref 6–20)
CALCIUM: 9.1 mg/dL (ref 8.9–10.3)
CHLORIDE: 102 mmol/L (ref 101–111)
CO2: 22 mmol/L (ref 22–32)
CREATININE: 0.75 mg/dL (ref 0.61–1.24)
GFR calc Af Amer: >60 mL/min (ref >60)
Glucose, Bld: 179 mg/dL — ABNORMAL HIGH (ref 65–99)
Potassium: 4.6 mmol/L (ref 3.5–5.1)
Sodium: 133 mmol/L — ABNORMAL LOW (ref 135–145)
TOTAL PROTEIN: 7.1 g/dL (ref 6.5–8.1)
Total Bilirubin: 0.8 mg/dL (ref 0.3–1.2)

## 2016-06-02 LAB — D-DIMER, QUANTITATIVE: D-Dimer, Quant: 1.15 ug/mL-FEU — ABNORMAL HIGH (ref 0.00–0.50)

## 2016-06-02 LAB — BRAIN NATRIURETIC PEPTIDE: B Natriuretic Peptide: 1005.6 pg/mL — ABNORMAL HIGH (ref 0.0–100.0)

## 2016-06-02 LAB — CBC
HEMATOCRIT: 36.3 % — AB (ref 39.0–52.0)
Hemoglobin: 12 g/dL — ABNORMAL LOW (ref 13.0–17.0)
MCH: 28.7 pg (ref 26.0–34.0)
MCHC: 33.1 g/dL (ref 30.0–36.0)
MCV: 86.8 fL (ref 78.0–100.0)
PLATELETS: 232 10*3/uL (ref 150–400)
RBC: 4.18 MIL/uL — AB (ref 4.22–5.81)
RDW: 13.8 % (ref 11.5–15.5)
WBC: 8.2 10*3/uL (ref 4.0–10.5)

## 2016-06-02 LAB — GLUCOSE, CAPILLARY: Glucose-Capillary: 140 mg/dL — ABNORMAL HIGH (ref 65–99)

## 2016-06-02 LAB — I-STAT TROPONIN, ED: TROPONIN I, POC: 0 ng/mL (ref 0.00–0.08)

## 2016-06-02 MED ORDER — SODIUM CHLORIDE 0.9% FLUSH
3.0000 mL | Freq: Two times a day (BID) | INTRAVENOUS | Status: DC
  Administered 2016-06-02 – 2016-06-05 (×5): 3 mL via INTRAVENOUS

## 2016-06-02 MED ORDER — DOCUSATE SODIUM 100 MG PO CAPS
100.0000 mg | ORAL_CAPSULE | Freq: Two times a day (BID) | ORAL | Status: DC
  Administered 2016-06-02 – 2016-06-05 (×6): 100 mg via ORAL
  Filled 2016-06-02 (×6): qty 1

## 2016-06-02 MED ORDER — ALBUTEROL SULFATE (2.5 MG/3ML) 0.083% IN NEBU
5.0000 mg | INHALATION_SOLUTION | Freq: Once | RESPIRATORY_TRACT | Status: AC
  Administered 2016-06-02: 5 mg via RESPIRATORY_TRACT
  Filled 2016-06-02: qty 6

## 2016-06-02 MED ORDER — PROMETHAZINE HCL 25 MG PO TABS: 12.5000 mg | ORAL_TABLET | ORAL | Status: DC | PRN

## 2016-06-02 MED ORDER — SODIUM CHLORIDE 0.9% FLUSH: 3.0000 mL | INTRAVENOUS | Status: DC | PRN

## 2016-06-02 MED ORDER — CARBOXYMETHYLCELLULOSE SODIUM 0.5 % OP SOLN: 1.0000 [drp] | Freq: Two times a day (BID) | OPHTHALMIC | Status: DC

## 2016-06-02 MED ORDER — IOPAMIDOL (ISOVUE-370) INJECTION 76%
INTRAVENOUS | Status: AC
  Administered 2016-06-02: 80 mL
  Filled 2016-06-02: qty 100

## 2016-06-02 MED ORDER — LATANOPROST 0.005 % OP SOLN
1.0000 [drp] | Freq: Every day | OPHTHALMIC | Status: DC
  Administered 2016-06-02 – 2016-06-04 (×3): 1 [drp] via OPHTHALMIC
  Filled 2016-06-02: qty 2.5

## 2016-06-02 MED ORDER — GABAPENTIN 300 MG PO CAPS
300.0000 mg | ORAL_CAPSULE | Freq: Every day | ORAL | Status: DC
  Administered 2016-06-02 – 2016-06-04 (×3): 300 mg via ORAL
  Filled 2016-06-02 (×3): qty 1

## 2016-06-02 MED ORDER — FUROSEMIDE 10 MG/ML IJ SOLN
40.0000 mg | Freq: Once | INTRAMUSCULAR | Status: AC
Start: 2016-06-02 — End: 2016-06-02
  Administered 2016-06-02: 40 mg via INTRAVENOUS
  Filled 2016-06-02: qty 4

## 2016-06-02 MED ORDER — ACETAMINOPHEN 325 MG PO TABS
650.0000 mg | ORAL_TABLET | ORAL | Status: DC | PRN
  Administered 2016-06-03 – 2016-06-05 (×3): 650 mg via ORAL
  Filled 2016-06-02 (×3): qty 2

## 2016-06-02 MED ORDER — METOPROLOL TARTRATE 50 MG PO TABS
50.0000 mg | ORAL_TABLET | Freq: Two times a day (BID) | ORAL | Status: DC
  Administered 2016-06-02 – 2016-06-05 (×6): 50 mg via ORAL
  Filled 2016-06-02 (×6): qty 1

## 2016-06-02 MED ORDER — APIXABAN 5 MG PO TABS
5.0000 mg | ORAL_TABLET | Freq: Two times a day (BID) | ORAL | Status: DC
  Administered 2016-06-02 – 2016-06-05 (×6): 5 mg via ORAL
  Filled 2016-06-02 (×6): qty 1

## 2016-06-02 MED ORDER — SODIUM CHLORIDE 0.9 % IV SOLN: 250.0000 mL | INTRAVENOUS | Status: DC | PRN

## 2016-06-02 MED ORDER — INSULIN ASPART 100 UNIT/ML ~~LOC~~ SOLN: 0.0000 [IU] | Freq: Three times a day (TID) | SUBCUTANEOUS | Status: DC

## 2016-06-02 MED ORDER — GLUCERNA SHAKE PO LIQD
237.0000 mL | Freq: Every day | ORAL | Status: DC
  Administered 2016-06-02 – 2016-06-04 (×2): 237 mL via ORAL
  Filled 2016-06-02 (×3): qty 237

## 2016-06-02 MED ORDER — ONDANSETRON HCL 4 MG/2ML IJ SOLN: 4.0000 mg | Freq: Four times a day (QID) | INTRAMUSCULAR | Status: DC | PRN

## 2016-06-02 MED ORDER — SUCRALFATE 1 GM/10ML PO SUSP
1.0000 g | Freq: Two times a day (BID) | ORAL | Status: DC
  Administered 2016-06-02 – 2016-06-05 (×6): 1 g via ORAL
  Filled 2016-06-02 (×6): qty 10

## 2016-06-02 MED ORDER — POLYVINYL ALCOHOL 1.4 % OP SOLN
1.0000 [drp] | Freq: Two times a day (BID) | OPHTHALMIC | Status: DC
  Administered 2016-06-02 – 2016-06-05 (×6): 1 [drp] via OPHTHALMIC
  Filled 2016-06-02: qty 15

## 2016-06-02 MED ORDER — PANTOPRAZOLE SODIUM 40 MG PO TBEC
40.0000 mg | DELAYED_RELEASE_TABLET | Freq: Every day | ORAL | Status: DC
  Administered 2016-06-03 – 2016-06-05 (×3): 40 mg via ORAL
  Filled 2016-06-02 (×3): qty 1

## 2016-06-02 MED ORDER — FUROSEMIDE 10 MG/ML IJ SOLN
40.0000 mg | Freq: Two times a day (BID) | INTRAMUSCULAR | Status: DC
  Administered 2016-06-03 – 2016-06-05 (×5): 40 mg via INTRAVENOUS
  Filled 2016-06-02 (×5): qty 4

## 2016-06-02 MED ORDER — INSULIN ASPART 100 UNIT/ML ~~LOC~~ SOLN: 0.0000 [IU] | Freq: Every day | SUBCUTANEOUS | Status: DC

## 2016-06-02 MED ORDER — LISINOPRIL 10 MG PO TABS
5.0000 mg | ORAL_TABLET | Freq: Every day | ORAL | Status: DC
  Administered 2016-06-02 – 2016-06-05 (×4): 5 mg via ORAL
  Filled 2016-06-02 (×4): qty 1

## 2016-06-02 NOTE — H&P (Signed)
History and Physical    Benjamin Hurley KGM:[id number] DOB: 1925/09/07 DOA: 06/02/2016  PCP: Cleophas Dunker, MD, [facility] Consultants:  Cardiology; GI; Urology; Endocrinology - all through the [place] Patient coming from: home - lives with wife; NOK: wife, [phone]  Chief Complaint: SOB  HPI: Benjamin Hurley is a 83 y.o. male with medical history significant of DM with peripheral neuropathy; HTN; carotid steonsis; BPH; and afib on Eliquis who was recently hospitalized (3/22-3/30) for afib with RVR and new diagnosis of what was determined to be pancreatic cancer which the family reports is invading the spleen and partially blocking the colon.  Discharged on 3/30 and the family thought that he was doing well.  However, he now says that he has been having problems with SOB since his last discharge, but he just told his family today.  Severe SOB with lying down.  He currently reports that he feels "stuffy" with lying down but a little bit better by sitting up.  +productive cough of greenish sputum, +post-tussive emesis.  He did feel his heart fluttering on Tuesday or Wednesday of last week.  +LUQ abdominal pain, mild nausea.  +LE edema, worsening despite elevating.  No fevers, no sick contacts.  Echo on 3/26 showed EF 30-35% with diffuse hypokinesis.  Oncology at [facility] to review the records to determine if they will pay for him to go outside their system for the pancreatic cancer.  He has an appointment with oncology here on Friday with Dr. Krista Blue.   ED Course: Neb given, condom cath placed, Lasix 40 mg IV given  Review of Systems: As per HPI; otherwise  review of systems reviewed and negative.   Ambulatory Status:   Essentially non-ambulatory, but he does transfer  Past Medical History:  Diagnosis Date  . A-fib ([place])    on Eliquis  . Arthritis    neck  . BPH (benign prostatic hyperplasia)   . Cancer [facility])    pancreatic cancer  . Carotid stenosis   . CHF (congestive heart failure)  ([place])   . Diabetes ([place])    type 2  . Essential hypertension   . GERD (gastroesophageal reflux disease)   . Glaucoma    both eyes  . Headache   . Peripheral neuropathy   . Stroke [facility]) 2014   left side weakness    Past Surgical History:  Procedure Laterality Date  . CAROTID ENDARTERECTOMY Bilateral   . COLONOSCOPY WITH PROPOFOL N/A 05/13/2016   Procedure: COLONOSCOPY WITH PROPOFOL;  Surgeon: Ladene Artist, MD;  Location: [facility] ENDOSCOPY;  Service: Endoscopy;  Laterality: N/A;  . EUS N/A 05/27/2016   Procedure: UPPER ENDOSCOPIC ULTRASOUND (EUS) LINEAR;  Surgeon: Milus Banister, MD;  Location: [facility] ENDOSCOPY;  Service: Endoscopy;  Laterality: N/A;  . lumber spine    . TRANSURETHRAL RESECTION OF PROSTATE      Social History   Social History  . Marital status: Married    Spouse name: N/A  . Number of children: N/A  . Years of education: N/A   Occupational History  . retired    Social History Main Topics  . Smoking status: Former Smoker    Types: Pipe    Quit date: 1968  . Smokeless tobacco: Former Systems developer    Types: Chew    Quit date: 1968  . Alcohol use No  . Drug use: No  . Sexual activity: Not on file   Other Topics Concern  . Not on file   Social History Narrative  .  No narrative on file    Allergies  Allergen Reactions  . Aspirin Other (See Comments)    Makes pt very sick    History reviewed. No pertinent family history.  Prior to Admission medications   Medication Sig Start Date End Date Taking? Authorizing Provider  acetaminophen (TYLENOL) 325 MG tablet Take 2 tablets (650 mg total) by mouth every 6 (six) hours as needed for moderate pain. 05/17/16  Yes Mauricio Gerome Apley, MD  apixaban (ELIQUIS) 5 MG TABS tablet Take 5 mg by mouth 2 (two) times daily.   Yes Historical Provider, MD  carboxymethylcellulose (REFRESH PLUS) 0.5 % SOLN Place 1 drop into both eyes 2 (two) times daily.   Yes Historical Provider, MD  docusate sodium (COLACE) 100 MG capsule Take 1  capsule (100 mg total) by mouth 2 (two) times daily. 05/17/16  Yes Mauricio Gerome Apley, MD  feeding supplement, GLUCERNA SHAKE, (GLUCERNA SHAKE) LIQD Take 237 mLs by mouth daily.   Yes Historical Provider, MD  gabapentin (NEURONTIN) 100 MG capsule Take 300 mg by mouth at bedtime. 03/13/16  Yes Historical Provider, MD  latanoprost (XALATAN) 0.005 % ophthalmic solution Place 1 drop into both eyes at bedtime.   Yes Historical Provider, MD  metFORMIN (GLUCOPHAGE) 500 MG tablet Take 500 mg by mouth 2 (two) times daily with a meal.   Yes Historical Provider, MD  metoprolol (LOPRESSOR) 50 MG tablet Take 1 tablet (50 mg total) by mouth 2 (two) times daily. 05/17/16  Yes Mauricio Gerome Apley, MD  Multiple Vitamin (MULTIVITAMIN WITH MINERALS) TABS tablet Take 1 tablet by mouth daily. 05/17/16  Yes Mauricio Gerome Apley, MD  pantoprazole (PROTONIX) 40 MG tablet Take 40 mg by mouth daily.   Yes Historical Provider, MD  promethazine (PHENERGAN) 25 MG tablet Take 12.5 mg by mouth every 4 (four) hours as needed for nausea or vomiting.   Yes Historical Provider, MD  sucralfate (CARAFATE) 1 GM/10ML suspension Take 1 g by mouth 2 (two) times daily.   Yes Historical Provider, MD    Physical Exam: Vitals:   06/02/16 1700 06/02/16 1730 06/02/16 1836 06/02/16 2009  BP: 119/72 139/85 (!) 132/91 (!) 155/83  Pulse: 92 (!) 107 (!) 103 (!) 107  Resp: 14 17 20 18   Temp:    98 F (36.7 C)  TempSrc:    Oral  SpO2: 97% 99% 98% 100%  Weight:    68.9 kg (151 lb 14.4 oz)  Height:    5\' 6"  (1.676 m)     General: Appears calm and comfortable and is NAD Eyes:  PERRL, EOMI, normal lids, iris ENT:  grossly normal hearing, lips & tongue, mmm Neck:  no LAD, masses or thyromegaly Cardiovascular:  Irregularly irregular, mild tachycardia, no m/r/g. 2-3+ pitting LE edema.  Respiratory:  CTA bilaterally, no w/r/r. Normal respiratory effort. Abdomen:  soft, TTP in LUQ, nd, NABS Skin:  no rash or induration seen on limited  exam Musculoskeletal:  grossly normal tone BUE/BLE, good ROM, no bony abnormality Psychiatric:  grossly normal mood and affect, speech fluent and appropriate, AOx3 Neurologic:  CN 2-12 grossly intact, moves all extremities in coordinated fashion, sensation intact  Labs on Admission: I have personally reviewed following labs and imaging studies  CBC:  Recent Labs Lab 06/02/16 1406  WBC 8.2  HGB 12.0*  HCT 36.3*  MCV 86.8  PLT 379   Basic Metabolic Panel:  Recent Labs Lab 06/02/16 1406  NA 133*  K 4.6  CL 102  CO2 22  GLUCOSE 179*  BUN 11  CREATININE 0.75  CALCIUM 9.1   GFR: Estimated Creatinine Clearance: 57.6 mL/min (by C-G formula based on SCr of 0.75 mg/dL). Liver Function Tests:  Recent Labs Lab 06/02/16 1406  AST 22  ALT 13*  ALKPHOS 56  BILITOT 0.8  PROT 7.1  ALBUMIN 3.6   No results for input(s): LIPASE, AMYLASE in the last 168 hours. No results for input(s): AMMONIA in the last 168 hours. Coagulation Profile: No results for input(s): INR, PROTIME in the last 168 hours. Cardiac Enzymes: No results for input(s): CKTOTAL, CKMB, CKMBINDEX, TROPONINI in the last 168 hours. BNP (last 3 results) No results for input(s): PROBNP in the last 8760 hours. HbA1C: No results for input(s): HGBA1C in the last 72 hours. CBG:  Recent Labs Lab 05/27/16 0844 06/02/16 2051  GLUCAP 126* 140*   Lipid Profile: No results for input(s): CHOL, HDL, LDLCALC, TRIG, CHOLHDL, LDLDIRECT in the last 72 hours. Thyroid Function Tests: No results for input(s): TSH, T4TOTAL, FREET4, T3FREE, THYROIDAB in the last 72 hours. Anemia Panel: No results for input(s): VITAMINB12, FOLATE, FERRITIN, TIBC, IRON, RETICCTPCT in the last 72 hours. Urine analysis: No results found for: COLORURINE, APPEARANCEUR, LABSPEC, PHURINE, GLUCOSEU, HGBUR, BILIRUBINUR, KETONESUR, PROTEINUR, UROBILINOGEN, NITRITE, LEUKOCYTESUR  Creatinine Clearance: Estimated Creatinine Clearance: 57.6 mL/min (by  C-G formula based on SCr of 0.75 mg/dL).  Sepsis Labs: @LABRCNTIP (procalcitonin:4,lacticidven:4) )No results found for this or any previous visit (from the past 240 hour(s)).   Radiological Exams on Admission: Dg Chest 2 View  Result Date: 06/02/2016 CLINICAL DATA:  Pancreatic cancer, shortness of breath for 2 weeks EXAM: CHEST  2 VIEW COMPARISON:  05/09/2016 FINDINGS: Cardiomediastinal silhouette is stable. No pulmonary edema. There is small left pleural effusion left basilar atelectasis or infiltrate. Mild degenerative changes lower thoracic and lumbar spine. IMPRESSION: No pulmonary edema. Small left pleural effusion with left basilar atelectasis or infiltrate. Electronically Signed   By: Lahoma Crocker M.D.   On: 06/02/2016 13:51   Ct Angio Chest Pe W And/or Wo Contrast  Result Date: 06/02/2016 CLINICAL DATA:  Shortness of breath, possible PE EXAM: CT ANGIOGRAPHY CHEST WITH CONTRAST TECHNIQUE: Multidetector CT imaging of the chest was performed using the standard protocol during bolus administration of intravenous contrast. Multiplanar CT image reconstructions and MIPs were obtained to evaluate the vascular anatomy. CONTRAST:  80 cc Isovue COMPARISON:  None FINDINGS: Cardiovascular: Cardiomegaly is noted. Atherosclerotic calcifications of thoracic aorta. Extensive atherosclerotic calcifications of coronary arteries. The study is of excellent technical quality. No pulmonary embolus is noted. Mediastinum/Nodes: There is a calcified right hilar lymph node axial image 49 measures 6.6 mm probable prior granulomatous disease. Multiple mediastinal calcified lymph nodes are noted probable prior granulomatous disease. Bilateral hilar lymph nodes calcified. Central airways are patent. Lungs/Pleura: There is bilateral small pleural effusion. Bilateral lower lobe bronchitic changes are noted. Mild bronchitic changes are noted right middle lobe. There is small atelectasis or infiltrate in left lower lobe posteriorly  retrocardiac. No pulmonary edema is noted. Upper Abdomen: The visualized upper abdomen shows extensive atherosclerotic calcifications of splenic artery. Again noted is irregular soft tissue density in splenic hilum suspicious for pancreatic malignancy. Musculoskeletal: No destructive bony lesions are noted. Osteopenia and degenerative changes thoracic spine. Review of the MIP images confirms the above findings. IMPRESSION: 1. No pulmonary embolus is noted. 2. Extensive atherosclerotic calcifications of coronary arteries. Cardiomegaly noted. 3. Bilateral small pleural effusion. Bilateral lower lobe bronchitic changes. Mild bronchitic changes right middle lobe. There is small atelectasis or  infiltrate in left lower lobe posterior medially retrocardiac. 4. Bilateral hilar and mediastinal calcified lymph nodes probable prior granulomatous disease. 5. No pulmonary edema. 6. Degenerative changes thoracic spine. Electronically Signed   By: Lahoma Crocker M.D.   On: 06/02/2016 18:51    EKG: Independently reviewed.  Atrial fibrillation with rate 93, PVCs, RBBB and LPFB nonspecific ST changes with no evidence of acute ischemia  Assessment/Plan Principal Problem:   Acute exacerbation of CHF (congestive heart failure) (HCC) Active Problems:   Atrial fibrillation with RVR (HCC)   Diabetes mellitus, type 2 (HCC)   Chronic systolic CHF (congestive heart failure) (HCC)   Pancreatic mass    CHF exacerbation -Patient with known depressed EF (30-35% on 3/18 Echo) presenting with worsening SOB and LE edema since last hospitalization -Weight was actually 158 on admission, 153 on discharge, and is 151 currently -However, he has been almost exclusively eating normal-sodium beef broth since discharge due to his inability to tolerate other than liquid diet -CXR not overly consistent with pulmonary edema -Chest CT shows mild changes c/w bronchitis and small atelectasis or infiltrate; patient does describe some purulence with  sputum but he otherwise denies infectious symptoms and has a normal WBC count so will not give antibiotics at this time -Elevated BNP 1005.6 (none prior) -Somewhat rate controlled, not in RVR -Will admit with telemetry -He should not need additional Echo since it was just done 2 weeks ago -Reports ASA allergy -Will start Lisinopril 5 mg daily (BP is normal to high and so should support addition of medication) -Continue lopressor -CHF order set utilized -Was given Lasix 40 mg x 1 in ER and will repeat with 40 mg BID; given his inability to ambulate and the need for I/O monitoring, a condom cath was placed in the ER, will continue -Repeat EKG in AM -Will r/o with serial troponins although doubt ACS based on symptoms -His CHA2DS2-VASc Score is 7 with an annual stroke risk of 11.2%/year.  This is an extremely high stroke risk, and by the family's description it is possible that he previously stroked twice in the short peri-operative period for back surgery in the past.  Continue Eliquis.  Diabetes -Glucose 179, 140 -Appears to have been taken off Lantus -Cover with SSI  Pancreatic cancer -Has oncology appt scheduled for this coming Friday here at [facility] -Consider inpatient evaluation if related issues arise   DVT prophylaxis: Eliquis Code Status: DNR - confirmed with patient/family Family Communication: Wife and daughter present throughout evaluation Disposition Plan:  Home once clinically improved Consults called: None  Admission status: Admit - It is my clinical opinion that admission to INPATIENT is reasonable and necessary because this patient will require at least 2 midnights in the hospital to treat this condition based on the medical complexity of the problems presented.  Given the aforementioned information, the predictability of an adverse outcome is felt to be significant.    Karmen Bongo MD [facility]  If 7PM-7AM, please contact  night-coverage [email or web address] Password [facility]  06/02/2016, 11:21 PM

## 2016-06-02 NOTE — ED Triage Notes (Signed)
Patient was recently diagnosed with pancreatic cancer. Patient been having SOB for over 2 weeks.  Patient reports that worse when lying down.  Patient has cough that is productive with green phlegm.

## 2016-06-02 NOTE — ED Notes (Signed)
ED Provider at bedside. 

## 2016-06-02 NOTE — ED Provider Notes (Signed)
[place] DEPT Provider Note   CSN: [id number] Arrival date & time: 06/02/16  1256     History   Chief Complaint Chief Complaint  Patient presents with  . Shortness of Breath    HPI Benjamin Hurley is a 83 y.o. male.  HPI  83 year old male history of atrial fibrillation on a liquids and metoprolol, pancreatic cancer awaiting treatment, CHF with a most recent EF of 35%, diabetes, hypertension and stroke to presents to the emergency department 2 weeks of dyspnea. Patient states she was in the hospital couple weeks ago for this pancreatic mass and he been dyspneic ever since then. He has swollen lower extremities again better with rest but don't go away and also has abdominal distention that is new for him. He says his shortness of breath and cough is worse when he lays flat. He has had some yellow productive cough and some dry cough. No fevers. No other sick contacts. No history of the same. No exacerbating or relieving symptoms.  Past Medical History:  Diagnosis Date  . A-fib ([place])    on Eliquis  . Arthritis    neck  . BPH (benign prostatic hyperplasia)   . Cancer [facility])    pancreatic cancer  . Carotid stenosis   . CHF (congestive heart failure) ([place])   . Diabetes ([place])    type 2  . Essential hypertension   . GERD (gastroesophageal reflux disease)   . Glaucoma    both eyes  . Headache   . Peripheral neuropathy   . Stroke [facility] 2014   left side weakness    Patient Active Problem List   Diagnosis Date Noted  . Acute exacerbation of CHF (congestive heart failure) ([place]) 06/02/2016  . Pancreatic mass   . Abdominal mass, left upper quadrant   . Malnutrition of moderate degree 05/15/2016  . Chronic systolic CHF (congestive heart failure) ([place]) 05/14/2016  . Abnormal CT scan, colon   . LUQ abdominal pain   . Benign neoplasm of transverse colon   . Stricture of colon ([place])   . Colonic mass   . Atrial fibrillation with RVR ([place]) 05/10/2016  . Hypomagnesemia  05/10/2016  . Diabetes mellitus, type 2 ([place]) 05/10/2016  . Encounter for palliative care   . Goals of care, counseling/discussion   . Nausea without vomiting   . Colon cancer ([place]) 05/09/2016    Past Surgical History:  Procedure Laterality Date  . CAROTID ENDARTERECTOMY Bilateral   . COLONOSCOPY WITH PROPOFOL N/A 05/13/2016   Procedure: COLONOSCOPY WITH PROPOFOL;  Surgeon: Ladene Artist, MD;  Location: [facility] ENDOSCOPY;  Service: Endoscopy;  Laterality: N/A;  . EUS N/A 05/27/2016   Procedure: UPPER ENDOSCOPIC ULTRASOUND (EUS) LINEAR;  Surgeon: Milus Banister, MD;  Location: [facility] ENDOSCOPY;  Service: Endoscopy;  Laterality: N/A;  . lumber spine    . TRANSURETHRAL RESECTION OF PROSTATE         Home Medications    Prior to Admission medications   Medication Sig Start Date End Date Taking? Authorizing Provider  acetaminophen (TYLENOL) 325 MG tablet Take 2 tablets (650 mg total) by mouth every 6 (six) hours as needed for moderate pain. 05/17/16  Yes Mauricio Gerome Apley, MD  apixaban (ELIQUIS) 5 MG TABS tablet Take 5 mg by mouth 2 (two) times daily.   Yes Historical Provider, MD  carboxymethylcellulose (REFRESH PLUS) 0.5 % SOLN Place 1 drop into both eyes 2 (two) times daily.   Yes Historical Provider, MD  docusate sodium (COLACE) 100 MG  capsule Take 1 capsule (100 mg total) by mouth 2 (two) times daily. 05/17/16  Yes Mauricio Gerome Apley, MD  feeding supplement, GLUCERNA SHAKE, (GLUCERNA SHAKE) LIQD Take 237 mLs by mouth daily.   Yes Historical Provider, MD  gabapentin (NEURONTIN) 100 MG capsule Take 300 mg by mouth at bedtime. 03/13/16  Yes Historical Provider, MD  latanoprost (XALATAN) 0.005 % ophthalmic solution Place 1 drop into both eyes at bedtime.   Yes Historical Provider, MD  metFORMIN (GLUCOPHAGE) 500 MG tablet Take 500 mg by mouth 2 (two) times daily with a meal.   Yes Historical Provider, MD  metoprolol (LOPRESSOR) 50 MG tablet Take 1 tablet (50 mg total) by mouth 2 (two) times  daily. 05/17/16  Yes Mauricio Gerome Apley, MD  Multiple Vitamin (MULTIVITAMIN WITH MINERALS) TABS tablet Take 1 tablet by mouth daily. 05/17/16  Yes Mauricio Gerome Apley, MD  pantoprazole (PROTONIX) 40 MG tablet Take 40 mg by mouth daily.   Yes Historical Provider, MD  promethazine (PHENERGAN) 25 MG tablet Take 12.5 mg by mouth every 4 (four) hours as needed for nausea or vomiting.   Yes Historical Provider, MD  sucralfate (CARAFATE) 1 GM/10ML suspension Take 1 g by mouth 2 (two) times daily.   Yes Historical Provider, MD    Family History History reviewed. No pertinent family history.  Social History Social History  Substance Use Topics  . Smoking status: Former Smoker    Types: Pipe    Quit date: 1968  . Smokeless tobacco: Former Systems developer    Types: Chew    Quit date: 1968  . Alcohol use No     Allergies   Aspirin   Review of Systems Review of Systems  All other systems reviewed and are negative.    Physical Exam Updated Vital Signs BP (!) 155/83 (BP Location: Left Arm)   Pulse (!) 107   Temp 98 F (36.7 C) (Oral)   Resp 18   Ht 5\' 6"  (1.676 m)   Wt 151 lb 14.4 oz (68.9 kg)   SpO2 100%   BMI 24.52 kg/m   Physical Exam  Constitutional: He appears well-developed and well-nourished.  HENT:  Head: Normocephalic and atraumatic.  Eyes: Conjunctivae and EOM are normal.  Neck: Normal range of motion.  Cardiovascular: An irregularly irregular rhythm present. Tachycardia present.   Pulmonary/Chest: Effort normal. Tachypnea noted. No respiratory distress. He has decreased breath sounds. He has rales.  Abdominal: Soft. Bowel sounds are normal. He exhibits no distension. There is no tenderness.  Musculoskeletal: Normal range of motion. He exhibits edema (BLE, trace pitting).  Neurological: He is alert.  Skin: Skin is warm and dry.  Nursing note and vitals reviewed.    ED Treatments / Results  Labs (all labs ordered are listed, but only abnormal results are  displayed) Labs Reviewed  CBC - Abnormal; Notable for the following:       Result Value   RBC 4.18 (*)    Hemoglobin 12.0 (*)    HCT 36.3 (*)    All other components within normal limits  COMPREHENSIVE METABOLIC PANEL - Abnormal; Notable for the following:    Sodium 133 (*)    Glucose, Bld 179 (*)    ALT 13 (*)    All other components within normal limits  BRAIN NATRIURETIC PEPTIDE - Abnormal; Notable for the following:    B Natriuretic Peptide 1,005.6 (*)    All other components within normal limits  D-DIMER, QUANTITATIVE (NOT AT [facility]) - Abnormal; Notable for  the following:    D-Dimer, Quant 1.15 (*)    All other components within normal limits  GLUCOSE, CAPILLARY - Abnormal; Notable for the following:    Glucose-Capillary 140 (*)    All other components within normal limits  CBC WITH DIFFERENTIAL/PLATELET  BASIC METABOLIC PANEL  HEMOGLOBIN A1C  I-STAT TROPOININ, ED    EKG  EKG Interpretation  Date/Time:  Sunday June 02 2016 13:08:13 EDT Ventricular Rate:  93 PR Interval:    QRS Duration: 149 QT Interval:  376 QTC Calculation: 468 R Axis:   112 Text Interpretation:  Atrial fibrillation Ventricular premature complex RBBB and LPFB Abnormal T, consider ischemia, lateral leads No old tracing to compare Confirmed by [facility] MD, Pattijo Juste [phone]) on 06/02/2016 2:12:53 PM       Radiology Dg Chest 2 View  Result Date: 06/02/2016 CLINICAL DATA:  Pancreatic cancer, shortness of breath for 2 weeks EXAM: CHEST  2 VIEW COMPARISON:  05/09/2016 FINDINGS: Cardiomediastinal silhouette is stable. No pulmonary edema. There is small left pleural effusion left basilar atelectasis or infiltrate. Mild degenerative changes lower thoracic and lumbar spine. IMPRESSION: No pulmonary edema. Small left pleural effusion with left basilar atelectasis or infiltrate. Electronically Signed   By: Lahoma Crocker M.D.   On: 06/02/2016 13:51   Ct Angio Chest Pe W And/or Wo Contrast  Result Date:  06/02/2016 CLINICAL DATA:  Shortness of breath, possible PE EXAM: CT ANGIOGRAPHY CHEST WITH CONTRAST TECHNIQUE: Multidetector CT imaging of the chest was performed using the standard protocol during bolus administration of intravenous contrast. Multiplanar CT image reconstructions and MIPs were obtained to evaluate the vascular anatomy. CONTRAST:  80 cc Isovue COMPARISON:  None FINDINGS: Cardiovascular: Cardiomegaly is noted. Atherosclerotic calcifications of thoracic aorta. Extensive atherosclerotic calcifications of coronary arteries. The study is of excellent technical quality. No pulmonary embolus is noted. Mediastinum/Nodes: There is a calcified right hilar lymph node axial image 49 measures 6.6 mm probable prior granulomatous disease. Multiple mediastinal calcified lymph nodes are noted probable prior granulomatous disease. Bilateral hilar lymph nodes calcified. Central airways are patent. Lungs/Pleura: There is bilateral small pleural effusion. Bilateral lower lobe bronchitic changes are noted. Mild bronchitic changes are noted right middle lobe. There is small atelectasis or infiltrate in left lower lobe posteriorly retrocardiac. No pulmonary edema is noted. Upper Abdomen: The visualized upper abdomen shows extensive atherosclerotic calcifications of splenic artery. Again noted is irregular soft tissue density in splenic hilum suspicious for pancreatic malignancy. Musculoskeletal: No destructive bony lesions are noted. Osteopenia and degenerative changes thoracic spine. Review of the MIP images confirms the above findings. IMPRESSION: 1. No pulmonary embolus is noted. 2. Extensive atherosclerotic calcifications of coronary arteries. Cardiomegaly noted. 3. Bilateral small pleural effusion. Bilateral lower lobe bronchitic changes. Mild bronchitic changes right middle lobe. There is small atelectasis or infiltrate in left lower lobe posterior medially retrocardiac. 4. Bilateral hilar and mediastinal calcified  lymph nodes probable prior granulomatous disease. 5. No pulmonary edema. 6. Degenerative changes thoracic spine. Electronically Signed   By: Lahoma Crocker M.D.   On: 06/02/2016 18:51    Procedures Procedures (including critical care time)  Medications Ordered in ED Medications  apixaban (ELIQUIS) tablet 5 mg (5 mg Oral Given 06/02/16 2128)  docusate sodium (COLACE) capsule 100 mg (100 mg Oral Given 06/02/16 2128)  feeding supplement (GLUCERNA SHAKE) (GLUCERNA SHAKE) liquid 237 mL (237 mLs Oral Given 06/02/16 2127)  gabapentin (NEURONTIN) capsule 300 mg (300 mg Oral Given 06/02/16 2129)  latanoprost (XALATAN) 0.005 % ophthalmic solution 1 drop (  1 drop Both Eyes Given 06/02/16 2130)  metoprolol (LOPRESSOR) tablet 50 mg (50 mg Oral Given 06/02/16 2128)  pantoprazole (PROTONIX) EC tablet 40 mg (not administered)  promethazine (PHENERGAN) tablet 12.5 mg (not administered)  sucralfate (CARAFATE) 1 GM/10ML suspension 1 g (1 g Oral Given 06/02/16 2129)  sodium chloride flush (NS) 0.9 % injection 3 mL (3 mLs Intravenous Given 06/02/16 2131)  sodium chloride flush (NS) 0.9 % injection 3 mL (not administered)  0.9 %  sodium chloride infusion (not administered)  acetaminophen (TYLENOL) tablet 650 mg (not administered)  ondansetron (ZOFRAN) injection 4 mg (not administered)  insulin aspart (novoLOG) injection 0-15 Units (not administered)  furosemide (LASIX) injection 40 mg (not administered)  lisinopril (PRINIVIL,ZESTRIL) tablet 5 mg (5 mg Oral Given 06/02/16 2128)  insulin aspart (novoLOG) injection 0-5 Units (0 Units Subcutaneous Not Given 06/02/16 2123)  polyvinyl alcohol (LIQUIFILM TEARS) 1.4 % ophthalmic solution 1 drop (1 drop Both Eyes Given 06/02/16 2129)  albuterol (PROVENTIL) (2.5 MG/3ML) 0.083% nebulizer solution 5 mg (5 mg Nebulization Given 06/02/16 1405)  furosemide (LASIX) injection 40 mg (40 mg Intravenous Given 06/02/16 1744)  iopamidol (ISOVUE-370) 76 % injection (80 mLs  Contrast Given 06/02/16  1751)     Initial Impression / Assessment and Plan / ED Course  I have reviewed the triage vital signs and the nursing notes.  Pertinent labs & imaging results that were available during my care of the patient were reviewed by me and considered in my medical decision making (see chart for details).    liekly acute on chronic CHF as cause, but PE/pneumonia also likely. Will eval appropriately.   Suspect CHF based on labs/workup/exam. Lasix given. Will admit for further management and diuresis.   Final Clinical Impressions(s) / ED Diagnoses   Final diagnoses:  SOB (shortness of breath)  Acute on chronic congestive heart failure, unspecified heart failure type [facility])     Merrily Pew, MD 06/02/16 [phone]

## 2016-06-03 LAB — CBC WITH DIFFERENTIAL/PLATELET
Basophils Absolute: 0 10*3/uL (ref 0.0–0.1)
Basophils Relative: 1 %
EOS PCT: 5 %
Eosinophils Absolute: 0.3 10*3/uL (ref 0.0–0.7)
HCT: 33.6 % — ABNORMAL LOW (ref 39.0–52.0)
HEMOGLOBIN: 11 g/dL — AB (ref 13.0–17.0)
LYMPHS ABS: 1.3 10*3/uL (ref 0.7–4.0)
LYMPHS PCT: 20 %
MCH: 28.3 pg (ref 26.0–34.0)
MCHC: 32.7 g/dL (ref 30.0–36.0)
MCV: 86.4 fL (ref 78.0–100.0)
Monocytes Absolute: 0.4 10*3/uL (ref 0.1–1.0)
Monocytes Relative: 6 %
NEUTROS PCT: 68 %
Neutro Abs: 4.2 10*3/uL (ref 1.7–7.7)
Platelets: 191 10*3/uL (ref 150–400)
RBC: 3.89 MIL/uL — AB (ref 4.22–5.81)
RDW: 13.8 % (ref 11.5–15.5)
WBC: 6.2 10*3/uL (ref 4.0–10.5)

## 2016-06-03 LAB — BASIC METABOLIC PANEL
Anion gap: 9 (ref 5–15)
BUN: 11 mg/dL (ref 6–20)
CHLORIDE: 98 mmol/L — AB (ref 101–111)
CO2: 26 mmol/L (ref 22–32)
Calcium: 8.9 mg/dL (ref 8.9–10.3)
Creatinine, Ser: 0.82 mg/dL (ref 0.61–1.24)
GFR calc Af Amer: >60 mL/min (ref >60)
GFR calc non Af Amer: >60 mL/min (ref >60)
GLUCOSE: 158 mg/dL — AB (ref 65–99)
POTASSIUM: 3.5 mmol/L (ref 3.5–5.1)
Sodium: 133 mmol/L — ABNORMAL LOW (ref 135–145)

## 2016-06-03 LAB — GLUCOSE, CAPILLARY
GLUCOSE-CAPILLARY: 189 mg/dL — AB (ref 65–99)
Glucose-Capillary: 166 mg/dL — ABNORMAL HIGH (ref 65–99)
Glucose-Capillary: 200 mg/dL — ABNORMAL HIGH (ref 65–99)
Glucose-Capillary: 98 mg/dL (ref 65–99)

## 2016-06-03 MED ORDER — POTASSIUM CHLORIDE CRYS ER 20 MEQ PO TBCR
40.0000 meq | EXTENDED_RELEASE_TABLET | Freq: Once | ORAL | Status: AC
Start: 2016-06-03 — End: 2016-06-03
  Administered 2016-06-03: 40 meq via ORAL
  Filled 2016-06-03: qty 2

## 2016-06-03 MED ORDER — INSULIN ASPART 100 UNIT/ML ~~LOC~~ SOLN
0.0000 [IU] | Freq: Three times a day (TID) | SUBCUTANEOUS | Status: DC
  Administered 2016-06-03 (×2): 2 [IU] via SUBCUTANEOUS
  Administered 2016-06-04: 1 [IU] via SUBCUTANEOUS
  Administered 2016-06-04: 3 [IU] via SUBCUTANEOUS
  Administered 2016-06-04 – 2016-06-05 (×3): 2 [IU] via SUBCUTANEOUS

## 2016-06-03 MED ORDER — ALUM & MAG HYDROXIDE-SIMETH 200-200-20 MG/5ML PO SUSP
30.0000 mL | ORAL | Status: DC | PRN
  Administered 2016-06-03: 30 mL via ORAL
  Filled 2016-06-03: qty 30

## 2016-06-03 MED ORDER — INSULIN ASPART 100 UNIT/ML ~~LOC~~ SOLN: 0.0000 [IU] | Freq: Every day | SUBCUTANEOUS | Status: DC

## 2016-06-03 NOTE — Progress Notes (Signed)
PROGRESS NOTE    Benjamin Hurley  HQI:[id number] DOB: 1926-01-15 DOA: 06/02/2016 PCP: Cleophas Dunker, MD    Brief Narrative:  83 y.o. male with medical history significant of DM with peripheral neuropathy; HTN; carotid steonsis; BPH; and afib on Eliquis who was recently hospitalized (3/22-3/30) for afib with RVR and new diagnosis of what was determined to be pancreatic cancer which the family reports is invading the spleen and partially blocking the colon.  Discharged on 3/30 and the family thought that he was doing well.  However, he now says that he has been having problems with SOB since his last discharge, but he just told his family today.  Severe SOB with lying down.  He currently reports that he feels "stuffy" with lying down but a little bit better by sitting up.  +productive cough of greenish sputum, +post-tussive emesis.  He did feel his heart fluttering on Tuesday or Wednesday of last week.  +LUQ abdominal pain, mild nausea.  +LE edema, worsening despite elevating.  No fevers, no sick contacts.  Echo on 3/26 showed EF 30-35% with diffuse hypokinesis.  Oncology at [facility] to review the records to determine if they will pay for him to go outside their system for the pancreatic cancer.  He has an appointment with oncology here on Friday with Dr. Krista Blue.  Assessment & Plan:   Principal Problem:   Acute exacerbation of CHF (congestive heart failure) (HCC) Active Problems:   Atrial fibrillation with RVR (HCC)   Diabetes mellitus, type 2 (HCC)   Chronic systolic CHF (congestive heart failure) (H[facility])   Pancreatic mass  CHF exacerbation -Patient with known depressed EF (30-35% on 3/18 Echo) presenting with worsening SOB and LE edema since last hospitalization -Patient has been almost exclusively eating normal-sodium beef broth since discharge due to his inability to tolerate other than liquid diet -Chest CT shows mild changes c/w bronchitis and small atelectasis or infiltrate -Elevated  BNP 1,005.6  -Reports ASA allergy -continued on Lisinopril 5 mg daily -Continue lopressor -improving with lasix. Will continue to diurese as tolerated  Afib -Rate controlled -on eliquis for secondary stroke prevention  Diabetes -Glucose 179, 140 -Appears to have been taken off Lantus -Continue SSI as needed  Pancreatic cancer -Pt reportedly has oncology appt scheduled for this coming Friday at [facility] -stable for now  DVT prophylaxis: eliquis Code Status: DNR Family Communication: Pt in room, family at bedside Disposition Plan: uncertain at this time  Consultants:     Procedures:     Antimicrobials: Anti-infectives    None       Subjective: No complaints this AM  Objective: Vitals:   06/02/16 2009 06/03/16 0649 06/03/16 1425 06/03/16 1506  BP: (!) 155/83 134/67  110/60  Pulse: (!) 107 77  84  Resp: 18 18    Temp: 98 F (36.7 C) 98 F (36.7 C) 97.8 F (36.6 C) 97.8 F (36.6 C)  TempSrc: Oral Oral  Oral  SpO2: 100% 98%  100%  Weight: 68.9 kg (151 lb 14.4 oz) 67.1 kg (147 lb 14.9 oz)    Height: 5\' 6"  (1.676 m)       Intake/Output Summary (Last 24 hours) at 06/03/16 1646 Last data filed at 06/03/16 0900  Gross per 24 hour  Intake              240 ml  Output             3900 ml  Net            -  3660 ml   Filed Weights   06/02/16 2009 06/03/16 0649  Weight: 68.9 kg (151 lb 14.4 oz) 67.1 kg (147 lb 14.9 oz)    Examination:  General exam: Appears calm and comfortable  Respiratory system: Clear to auscultation. Respiratory effort normal. Cardiovascular system: S1 & S2 heard, RRR. No JVD, murmurs, rubs, gallops or clicks. No pedal edema. Gastrointestinal system: Abdomen is nondistended, soft and nontender. No organomegaly or masses felt. Normal bowel sounds heard. Central nervous system: Alert and oriented. No focal neurological deficits. Extremities: Symmetric 5 x 5 power. Skin: No rashes, lesions Psychiatry: Judgement and insight appear normal.  Mood & affect appropriate.   Data Reviewed: I have personally reviewed following labs and imaging studies  CBC:  Recent Labs Lab 06/02/16 1406 06/03/16 0512  WBC 8.2 6.2  NEUTROABS  --  4.2  HGB 12.0* 11.0*  HCT 36.3* 33.6*  MCV 86.8 86.4  PLT 232 025   Basic Metabolic Panel:  Recent Labs Lab 06/02/16 1406 06/03/16 0512  NA 133* 133*  K 4.6 3.5  CL 102 98*  CO2 22 26  GLUCOSE 179* 158*  BUN 11 11  CREATININE 0.75 0.82  CALCIUM 9.1 8.9   GFR: Estimated Creatinine Clearance: 56.2 mL/min (by C-G formula based on SCr of 0.82 mg/dL). Liver Function Tests:  Recent Labs Lab 06/02/16 1406  AST 22  ALT 13*  ALKPHOS 56  BILITOT 0.8  PROT 7.1  ALBUMIN 3.6   No results for input(s): LIPASE, AMYLASE in the last 168 hours. No results for input(s): AMMONIA in the last 168 hours. Coagulation Profile: No results for input(s): INR, PROTIME in the last 168 hours. Cardiac Enzymes: No results for input(s): CKTOTAL, CKMB, CKMBINDEX, TROPONINI in the last 168 hours. BNP (last 3 results) No results for input(s): PROBNP in the last 8760 hours. HbA1C: No results for input(s): HGBA1C in the last 72 hours. CBG:  Recent Labs Lab 06/02/16 2051 06/03/16 0835 06/03/16 1155  GLUCAP 140* 166* 200*   Lipid Profile: No results for input(s): CHOL, HDL, LDLCALC, TRIG, CHOLHDL, LDLDIRECT in the last 72 hours. Thyroid Function Tests: No results for input(s): TSH, T4TOTAL, FREET4, T3FREE, THYROIDAB in the last 72 hours. Anemia Panel: No results for input(s): VITAMINB12, FOLATE, FERRITIN, TIBC, IRON, RETICCTPCT in the last 72 hours. Sepsis Labs: No results for input(s): PROCALCITON, LATICACIDVEN in the last 168 hours.  No results found for this or any previous visit (from the past 240 hour(s)).   Radiology Studies: Dg Chest 2 View  Result Date: 06/02/2016 CLINICAL DATA:  Pancreatic cancer, shortness of breath for 2 weeks EXAM: CHEST  2 VIEW COMPARISON:  05/09/2016 FINDINGS:  Cardiomediastinal silhouette is stable. No pulmonary edema. There is small left pleural effusion left basilar atelectasis or infiltrate. Mild degenerative changes lower thoracic and lumbar spine. IMPRESSION: No pulmonary edema. Small left pleural effusion with left basilar atelectasis or infiltrate. Electronically Signed   By: Lahoma Crocker M.D.   On: 06/02/2016 13:51   Ct Angio Chest Pe W And/or Wo Contrast  Result Date: 06/02/2016 CLINICAL DATA:  Shortness of breath, possible PE EXAM: CT ANGIOGRAPHY CHEST WITH CONTRAST TECHNIQUE: Multidetector CT imaging of the chest was performed using the standard protocol during bolus administration of intravenous contrast. Multiplanar CT image reconstructions and MIPs were obtained to evaluate the vascular anatomy. CONTRAST:  80 cc Isovue COMPARISON:  None FINDINGS: Cardiovascular: Cardiomegaly is noted. Atherosclerotic calcifications of thoracic aorta. Extensive atherosclerotic calcifications of coronary arteries. The study is of excellent technical quality.  No pulmonary embolus is noted. Mediastinum/Nodes: There is a calcified right hilar lymph node axial image 49 measures 6.6 mm probable prior granulomatous disease. Multiple mediastinal calcified lymph nodes are noted probable prior granulomatous disease. Bilateral hilar lymph nodes calcified. Central airways are patent. Lungs/Pleura: There is bilateral small pleural effusion. Bilateral lower lobe bronchitic changes are noted. Mild bronchitic changes are noted right middle lobe. There is small atelectasis or infiltrate in left lower lobe posteriorly retrocardiac. No pulmonary edema is noted. Upper Abdomen: The visualized upper abdomen shows extensive atherosclerotic calcifications of splenic artery. Again noted is irregular soft tissue density in splenic hilum suspicious for pancreatic malignancy. Musculoskeletal: No destructive bony lesions are noted. Osteopenia and degenerative changes thoracic spine. Review of the MIP  images confirms the above findings. IMPRESSION: 1. No pulmonary embolus is noted. 2. Extensive atherosclerotic calcifications of coronary arteries. Cardiomegaly noted. 3. Bilateral small pleural effusion. Bilateral lower lobe bronchitic changes. Mild bronchitic changes right middle lobe. There is small atelectasis or infiltrate in left lower lobe posterior medially retrocardiac. 4. Bilateral hilar and mediastinal calcified lymph nodes probable prior granulomatous disease. 5. No pulmonary edema. 6. Degenerative changes thoracic spine. Electronically Signed   By: Lahoma Crocker M.D.   On: 06/02/2016 18:51    Scheduled Meds: . apixaban  5 mg Oral BID  . docusate sodium  100 mg Oral BID  . feeding supplement (GLUCERNA SHAKE)  237 mL Oral Daily  . furosemide  40 mg Intravenous BID  . gabapentin  300 mg Oral QHS  . insulin aspart  0-5 Units Subcutaneous QHS  . insulin aspart  0-9 Units Subcutaneous TID WC  . latanoprost  1 drop Both Eyes QHS  . lisinopril  5 mg Oral Daily  . metoprolol  50 mg Oral BID  . pantoprazole  40 mg Oral Daily  . polyvinyl alcohol  1 drop Both Eyes BID  . sodium chloride flush  3 mL Intravenous Q12H  . sucralfate  1 g Oral BID   Continuous Infusions:   LOS: 1 day   CHIU, Orpah Melter, MD [facility] Pager [phone]  If 7PM-7AM, please contact night-coverage [email or web address] Password [facility] 06/03/2016, 4:46 PM

## 2016-06-03 NOTE — Evaluation (Signed)
Occupational Therapy Evaluation Patient Details Name: Benjamin Hurley MRN: [id number] DOB: August 29, 1925 Today's Date: 06/03/2016    History of Present Illness 83 yo male admitted with CHF. Hx of DM, neuropathy, CVA, pancreatic cancer, A fib.    Clinical Impression   This 83 y/o M presents for the above. Pt demonstrates decreased activity tolerance and requires assist to complete ADLs. Pt's wife has been assisting with ADLs prior to this admission and is able to assist with ADLs upon return home PRN. Pt will benefit from continued acute OT services to increase safety and independence during functional mobility and ADL completion. Goals are set for supervision to [name].     Follow Up Recommendations  Supervision/Assistance - 24 hour    Equipment Recommendations  None recommended by OT           Precautions / Restrictions Precautions Precautions: Fall Restrictions Weight Bearing Restrictions: No      Mobility Bed Mobility Overal bed mobility: Needs Assistance Bed Mobility: Supine to Sit     Supine to sit: Supervision     General bed mobility comments: for safety, lines.   Transfers Overall transfer level: Needs assistance Equipment used: Rolling walker (2 wheeled) Transfers: Sit to/from Stand Sit to Stand: Min guard         General transfer comment: verbal cues for hand placement     Balance Overall balance assessment: Needs assistance Sitting-balance support: No upper extremity supported Sitting balance-[name]: Fair       Standing balance-[name]: Poor Standing balance comment: requires RW                           ADL either performed or assessed with clinical judgement   ADL Overall ADL's : Needs assistance/impaired Eating/Feeding: Set up   Grooming: Wash/dry hands;Oral care;Min guard;Sitting;Standing Grooming Details (indicate cue type and reason): Completed standing wash/dry hands use sink for BUE support during task completiong;  completed oral care sitting EOB with supervision  Upper Body Bathing: Min guard;Sitting   Lower Body Bathing: Minimal assistance;Sit to/from stand   Upper Body Dressing : Min guard;Sitting   Lower Body Dressing: Moderate assistance;Sit to/from stand   Toilet Transfer: Min guard;Comfort height toilet;Grab bars;RW;Ambulation   Toileting- Water quality scientist and Hygiene: Maximal assistance;Sit to/from stand Toileting - Clothing Manipulation Details (indicate cue type and reason): MaxA for completing toilet hygiene; MinGuard sit to/from stand     Functional mobility during ADLs: Minimal assistance;Rolling walker General ADL Comments: Pt completed room level functional mobility, sitting and standing ADLs, verbal cues for keeping within RW during functional mobility and cues for hand placement for sit to/from stand; educated on use/placement of urinal while in bed (vs use of condom cath)                         Pertinent Vitals/Pain Pain Assessment: No/denies pain          Extremity/Trunk Assessment Upper Extremity Assessment Upper Extremity Assessment: Generalized weakness   Lower Extremity Assessment Lower Extremity Assessment: Generalized weakness;LLE deficits/detail;RLE deficits/detail RLE Sensation: history of peripheral neuropathy LLE Deficits / Details: weak dorsiflexion 2* prior CVA LLE Sensation: history of peripheral neuropathy   Cervical / Trunk Assessment Cervical / Trunk Assessment: Kyphotic   Communication Communication Communication: No difficulties   Cognition Arousal/Alertness: Awake/alert Behavior During Therapy: WFL for tasks assessed/performed Overall Cognitive Status: Within Functional Limits for tasks assessed  General Comments                  Home Living Family/patient expects to be discharged to:: Private residence Living Arrangements: Spouse/significant other Available Help at  Discharge: Family Type of Home: House             Bathroom Shower/Tub: Tub/shower unit         Home Equipment: Shower seat;Grab bars - toilet;Hand held shower head          Prior Functioning/Environment Level of Independence: Needs assistance  Gait / Transfers Assistance Needed: ambulatory with RW usually however pt stated he has been using wheelchair for last week or so prior to this admission ADL's / Homemaking Assistance Needed: wife assisting with bathing, dressing            OT Problem List: Decreased strength;Decreased activity tolerance      OT Treatment/Interventions: Self-care/ADL training;Energy conservation;Patient/family education;Therapeutic activities;Therapeutic exercise;DME and/or AE instruction    OT Goals(Current goals can be found in the care plan section) Acute Rehab OT Goals Patient Stated Goal: to get stronger OT Goal Formulation: With patient Time For Goal Achievement: 06/10/16 Potential to Achieve Goals: Good ADL Goals Pt Will Perform Grooming: with supervision;standing Pt Will Transfer to Toilet: with supervision;ambulating;regular height toilet;grab bars Pt Will Perform Toileting - Clothing Manipulation and hygiene: with min assist;sit to/from stand Additional ADL Goal #1: Pt will independently demonstrate at least 1 energy conservation technique while completing ADL task.   OT Frequency: Min 2X/week                             End of Session Equipment Utilized During Treatment: Gait belt;Rolling walker Nurse Communication: Other (comment) (Nurse tech - trialing use of urinal vs condom cath)  Activity Tolerance: Patient tolerated treatment well Patient left: in bed;with call bell/phone within reach;with bed alarm set;with family/visitor present;with nursing/sitter in room  OT Visit Diagnosis: Unsteadiness on feet (R26.81);Muscle weakness (generalized) (M62.81)                Time: 7867-6720 OT Time Calculation (min): 32  min Charges:  OT General Charges $OT Visit: 1 Procedure OT Evaluation $OT Eval Moderate Complexity: 1 Procedure OT Treatments $Self Care/Home Management : 8-22 mins G-Codes:     Benjamin Hurley, OT Pager [phone] 06/03/2016   Benjamin Hurley 06/03/2016, 1:08 PM

## 2016-06-03 NOTE — Progress Notes (Signed)
Patient complaining of chest pressure 7/10 on right upper side. Patient stated he had a big dinner and it feels like indigestion. VSS. EKG obtained. Pt is asymptomatic. NP on call notified. Maalox ordered under caridiology PRN order set. Will continue to monitor closely.

## 2016-06-03 NOTE — Evaluation (Signed)
Physical Therapy Evaluation Patient Details Name: Benjamin Hurley MRN: [id number] DOB: [birth year] Today's Date: 06/03/2016   History of Present Illness  83 yo male admitted with CHF. Hx of DM, neuropathy, CVA, pancreatic cancer, A fib.   Clinical Impression  On eval, pt required Min guard assist for mobility. He walked ~40 ' x2 with a RW. Pt presents with general weakness, decreased activity tolerance, and impaired gait and balance. Recommend HHPT follow up as long as family can continue to manage pt's care at home. Will follow.     Follow Up Recommendations Home health PT;Supervision/Assistance - 24 hour    Equipment Recommendations  None recommended by PT    Recommendations for Other Services       Precautions / Restrictions Precautions Precautions: Fall Restrictions Weight Bearing Restrictions: No      Mobility  Bed Mobility Overal bed mobility: Needs Assistance Bed Mobility: Supine to Sit     Supine to sit: Supervision     General bed mobility comments: for safety, lines.   Transfers Overall transfer level: Needs assistance Equipment used: Rolling walker (2 wheeled) Transfers: Sit to/from Stand Sit to Stand: Min guard         General transfer comment: VCs hand placement. close guard for safety.   Ambulation/Gait Ambulation/Gait assistance: Min guard Ambulation Distance (Feet): 40 Feet (x2) Assistive device: Rolling walker (2 wheeled) Gait Pattern/deviations: Decreased dorsiflexion - left;Steppage;Decreased stride length;Step-through pattern     General Gait Details: Close guard for safety. Steppage gait pattern L LE. HR ~107 bpm, O2 98% on RA. Seated reat break taken/needed after first 40 feet.   Stairs            Wheelchair Mobility    Modified Rankin (Stroke Patients Only)       Balance Overall balance assessment: Needs assistance           Standing balance-[name]: Poor Standing balance comment: requires RW                              Pertinent Vitals/Pain Pain Assessment: No/denies pain    Home Living Family/patient expects to be discharged to:: Private residence Living Arrangements: Spouse/significant other Available Help at Discharge: Family Type of Home: House         Home Equipment: Wheelchair - [employer] - standard      Prior Function Level of Independence: Needs assistance   Gait / Transfers Assistance Needed: ambulatory with RW usually however pt stated he has been using wheelchair for last week or so prior to this admission  ADL's / Homemaking Assistance Needed: wife assisting with bathing, dressing        Hand Dominance        Extremity/Trunk Assessment   Upper Extremity Assessment Upper Extremity Assessment: Generalized weakness    Lower Extremity Assessment Lower Extremity Assessment: Generalized weakness;LLE deficits/detail;RLE deficits/detail RLE Sensation: history of peripheral neuropathy LLE Deficits / Details: weak dorsiflexion 2* prior CVA LLE Sensation: history of peripheral neuropathy    Cervical / Trunk Assessment Cervical / Trunk Assessment: Kyphotic  Communication   Communication: No difficulties  Cognition Arousal/Alertness: Awake/alert Behavior During Therapy: WFL for tasks assessed/performed Overall Cognitive Status: Within Functional Limits for tasks assessed                                        General Comments  Exercises     Assessment/Plan    PT Assessment Patient needs continued PT services  PT Problem List Decreased strength;Decreased mobility;Decreased balance;Decreased knowledge of use of DME;Decreased activity tolerance       PT Treatment Interventions DME instruction;Gait training;Therapeutic activities;Therapeutic exercise;Patient/family education;Functional mobility training;Balance training    PT Goals (Current goals can be found in the Care Plan section)  Acute Rehab PT Goals Patient Stated  Goal: to get stronger PT Goal Formulation: With patient Time For Goal Achievement: 06/17/16 Potential to Achieve Goals: Good    Frequency Min 3X/week   Barriers to discharge        Co-evaluation               End of Session Equipment Utilized During Treatment: Gait belt Activity Tolerance: Patient limited by fatigue Patient left: in bed;with call bell/phone within reach;with bed alarm set   PT Visit Diagnosis: Muscle weakness (generalized) (M62.81);Difficulty in walking, not elsewhere classified (R26.2)    Time: 1022-1040 PT Time Calculation (min) (ACUTE ONLY): 18 min   Charges:   PT Evaluation $PT Eval Low Complexity: 1 Procedure     PT G Codes:           [name], MPT Pager: [phone]

## 2016-06-03 NOTE — Progress Notes (Addendum)
[place]  Telephone:([phone]) [phone] Fax:([phone]) [place] Note   Patient Care Team: Cleophas Dunker, MD as PCP - General (Nurse Practitioner) Kassie Mends, RN as [place] Management Katha Cabal, LCSW as [place] Management (Licensed Clinical Social Worker) 06/07/2016  CHIEF COMPLAINTS/PURPOSE OF CONSULTATION:  Pancreatic cancer   Oncology History   Cancer Staging Pancreatic adenocarcinoma [facility]) Staging form: Exocrine Pancreas, AJCC 8th Edition - Clinical stage from 05/27/2016: Stage Unknown (cTX, cNX, cM0) - Signed by Truitt Merle, MD on 06/09/2016       Pancreatic adenocarcinoma ([place])   05/13/2016 Procedure    A 73mm poly in the transverse colon, removed, stricture in the proximal descending colon, diverticulosis.       05/15/2016 Imaging    MR Abdomen w wo contrast 1. Moderate to markedly motion degraded exam. 2. In combination with the 05/09/2016 CT findings, tumor extension into the splenic hilum is favored to arise from the pancreatic tail. Secondary involvement of the splenic flexure of the colon. Colonic primary with direct extension into the spleen is felt less likely, given absence of well-defined circumferential colonic mass or high-grade obstruction. 3. Indeterminate left hepatic lobe lesion. Recommend attention on follow-up. 4. Ascites and bilateral pleural effusions suggests fluid overload. 5. Left-sided colonic wall thickening suggested. Considerations include infectious colitis or low-grade ischemia secondary to direct tumor involvement.      05/27/2016 Initial Biopsy    FINE NEEDLE ASPIRATION, ENDOSCOPIC, LEFT UPPER QUADRANT MASS (SPECIMEN 1 OF 1 COLLECTED 05/27/16): MALIGNANT CELLS CONSISTENT WITH ADENOCARCINOMA, SEE COMMENT. Preliminary Diagnosis Intraoperative Diagnosis: Atypical. Additional tissue requested. ([facility])      05/27/2016 Procedure    Upper EUS and biopsy by Dr. Ardis Hughs.  Limited exam, a pooly defined 1.8cm soft tissue mass in the pancreatic tail and transgastric FNA of the mass was obtained.       05/27/2016 Initial Diagnosis    Pancreatic adenocarcinoma ([place])      06/02/2016 Imaging    CT chest showed no PE, cardiomegaly, bilateral small pleural effusion. No evidence of metastasis.         HISTORY OF PRESENTING ILLNESS (06/07/2016):  Benjamin Hurley 83 y.o. male who presented to the ED on 05/09/16 complaining of abdominal pain in the left upper quadrant. He was hospitalized with C. Difficile two weeks prior and had uncontrolled atrial fibrillation. Abdominal X-ray on 05/12/16 showed mild gaseous distension of small bowel in left mid abdomen suspicious for ileus. There were moderate distension of the right colon and proximal transverse colon with gas suspicious for ileus or early colonic obstruction. Also nonspecific air-fluid levels noted within colon and no evidence of free abdominal air.  He underwent colonoscopy on 05/13/16. Colonoscopy showed one 8 mm polyp in the proximal transverse colon, resected and retrieved. Stricture in the proximal descending colon was biopsied. Biopsy of the transverse colon polyp showed tubular adenoma and high grade dysplasia is not identified. Biopsy of the colon mass showed benign polypoid colorectal mucosa with a lymphoid aggregate and no evidence of malignancy.  MR Abdomen on 05/15/16 showed tumor extension into the splenic hilum is favored to arise from the pancreatic tail. Secondary involvement of the splenic flexure of the colon. Colonic primary with direct extension into the spleen is felt less likely, given absence of well-defined circumferential colonic mass or high-grade obstruction. Also seen was an indeterminate left hepatic lobe lesion.   Fine needle aspiration of the left upper quadrant mass on 05/27/16 revealed  malignant cells consistent with adenocarcinoma.  He underwent Chest X-ray on 06/02/16 to investigate shortness  of breath for 2 weeks. This scan showed no pulmonary edema. There was small left pleural effusion with left basilar atelectasis or infiltrate. CT Angio Chest at that time showed no pulmonary embolus. There was extensive atherosclerotic calcifications of coronary arteries and cardiomegaly noted. There was bilateral small pleural effusion with bilateral lower lobe bronchitic changes and mild bronchitic changes of the right middle lobe. Also seen was bilateral hilar and mediastinal calcified lymph nodes probable prior granulomatous disease.  The patient is accompanied by his wife, daughter, and son. The patient has been suffering from abdominal pain for the last 3 years but was never told any definitive reason, even after imaging studies at the [place]. He reports becoming nauseous with eating. He continues to complain of abdominal pain and only takes Tylenol to resolve it. He reports his bowels are off and on, from loose stool to hard stool. He reports sometimes when he goes to urinate he will move his bowels without feeling the urge to move his bowels. His appetite is good. The patient states he has a bear in his stomach. He takes Phenergan as needed when his stomach is upset. He eats four meals daily. He lost weight while he was hospitalized but has gained weight since and is back to his normal weight. He denies chest pain.   He had a stroke before back surgery and after. The patient states before all this happened he was able to walk quickly with a walker. He is in a wheelchair now because he reports weakness secondary to not eating much. He walks with the physical therapist with the use of a walker. At home, he uses the wheelchair and has been walking less.   He smoked a pipe 50 years ago. He also chewed tobacco for years. He does not drink alcohol. He worked as a Development worker, international aid.   MEDICAL HISTORY:  Past Medical History:  Diagnosis Date  . A-fib ([place])    on Eliquis  . Arthritis    neck    . BPH (benign prostatic hyperplasia)   . Cancer [facility])    pancreatic cancer  . Carotid stenosis   . CHF (congestive heart failure) ([place])   . Diabetes ([place])    type 2  . Essential hypertension   . GERD (gastroesophageal reflux disease)   . Glaucoma    both eyes  . Headache   . Peripheral neuropathy   . Stroke [facility]) 2014   left side weakness    SURGICAL HISTORY: Past Surgical History:  Procedure Laterality Date  . CAROTID ENDARTERECTOMY Bilateral   . COLONOSCOPY WITH PROPOFOL N/A 05/13/2016   Procedure: COLONOSCOPY WITH PROPOFOL;  Surgeon: Ladene Artist, MD;  Location: [facility] ENDOSCOPY;  Service: Endoscopy;  Laterality: N/A;  . EUS N/A 05/27/2016   Procedure: UPPER ENDOSCOPIC ULTRASOUND (EUS) LINEAR;  Surgeon: Milus Banister, MD;  Location: [facility] ENDOSCOPY;  Service: Endoscopy;  Laterality: N/A;  . lumber spine    . TRANSURETHRAL RESECTION OF PROSTATE      SOCIAL HISTORY: Social History   Social History  . Marital status: Married    Spouse name: N/A  . Number of children: N/A  . Years of education: N/A   Occupational History  . retired    Social History Main Topics  . Smoking status: Former Smoker    Years: 5.00    Types: Pipe    Quit date: 1968  .  Smokeless tobacco: Former Systems developer    Types: Chew    Quit date: 1968  . Alcohol use No  . Drug use: No  . Sexual activity: Not on file   Other Topics Concern  . Not on file   Social History Narrative  . No narrative on file    FAMILY HISTORY: Family History  Problem Relation Age of Onset  . Cancer Mother     cancer     ALLERGIES:  is allergic to aspirin.  MEDICATIONS:  Current Outpatient Prescriptions  Medication Sig Dispense Refill  . acetaminophen (TYLENOL) 325 MG tablet Take 2 tablets (650 mg total) by mouth every 6 (six) hours as needed for moderate pain. 20 tablet 0  . apixaban (ELIQUIS) 5 MG TABS tablet Take 5 mg by mouth 2 (two) times daily.    . carboxymethylcellulose (REFRESH PLUS) 0.5 % SOLN Place 1  drop into both eyes 2 (two) times daily.    Marland Kitchen docusate sodium (COLACE) 100 MG capsule Take 1 capsule (100 mg total) by mouth 2 (two) times daily. 10 capsule 0  . feeding supplement, GLUCERNA SHAKE, (GLUCERNA SHAKE) LIQD Take 237 mLs by mouth daily.    . furosemide (LASIX) 40 MG tablet Take 1 tablet (40 mg total) by mouth daily. 30 tablet 1  . gabapentin (NEURONTIN) 100 MG capsule Take 300 mg by mouth at bedtime.    Marland Kitchen glipiZIDE (GLUCOTROL) 5 MG tablet Take 1 tablet (5 mg total) by mouth 2 (two) times daily before a meal. 60 tablet 1  . latanoprost (XALATAN) 0.005 % ophthalmic solution Place 1 drop into both eyes at bedtime.    Marland Kitchen lisinopril (PRINIVIL,ZESTRIL) 5 MG tablet Take 1 tablet (5 mg total) by mouth daily. 30 tablet 1  . metoprolol (LOPRESSOR) 50 MG tablet Take 1 tablet (50 mg total) by mouth 2 (two) times daily. 60 tablet 0  . Multiple Vitamin (MULTIVITAMIN WITH MINERALS) TABS tablet Take 1 tablet by mouth daily. 30 tablet 0  . pantoprazole (PROTONIX) 40 MG tablet Take 40 mg by mouth daily.    . promethazine (PHENERGAN) 25 MG tablet Take 12.5 mg by mouth every 4 (four) hours as needed for nausea or vomiting.    . sucralfate (CARAFATE) 1 GM/10ML suspension Take 1 g by mouth 2 (two) times daily.     No current facility-administered medications for this visit.     REVIEW OF SYSTEMS:   Constitutional: Denies fevers, chills or abnormal night sweats (+) weakness secondary to decreased eating Eyes: Denies blurriness of vision, double vision or watery eyes Ears, nose, mouth, throat, and face: Denies mucositis or sore throat Respiratory: Denies cough, dyspnea or wheezes Cardiovascular: Denies palpitation, chest discomfort or lower extremity swelling Gastrointestinal:  Denies heartburn or change in bowel habits (+) abdominal pain, loss of appetite, nausea with eating Skin: Denies abnormal skin rashes Lymphatics: Denies new lymphadenopathy or easy bruising Neurological:Denies numbness, tingling  or new weaknesses Behavioral/Psych: Mood is stable, no new changes  All other systems were reviewed with the patient and are negative.  PHYSICAL EXAMINATION: ECOG PERFORMANCE STATUS: 3 - Symptomatic, >50% confined to bed  Vitals:   06/07/16 1136  BP: (!) 109/52  Pulse: 87  Resp: 18  Temp: 97.8 F (36.6 C)   Filed Weights   06/07/16 1136  Weight: 143 lb 3.2 oz (65 kg)    GENERAL:alert, no distress and comfortable. Patient presents in wheelchair. SKIN: skin color, texture, turgor are normal, no rashes or significant lesions EYES: normal, conjunctiva are  pink and non-injected, sclera clear OROPHARYNX:no exudate, no erythema and lips, buccal mucosa, and tongue normal  NECK: supple, thyroid normal size, non-tender, without nodularity LYMPH:  no palpable lymphadenopathy in the cervical, axillary or inguinal LUNGS: clear to auscultation and percussion with normal breathing effort HEART: regular rate & rhythm and no murmurs and no lower extremity edema ABDOMEN:abdomen soft and normal bowel sounds (+) Mild tenderness with palpation of the left upper quadrant with no palpable masses.  Musculoskeletal:no cyanosis of digits and no clubbing  PSYCH: alert & oriented x 3 with fluent speech NEURO: no focal motor/sensory deficits  LABORATORY DATA:  I have reviewed the data as listed CBC Latest Ref Rng & Units 06/03/2016 06/02/2016 05/17/2016  WBC 4.0 - 10.5 K/uL 6.2 8.2 5.7  Hemoglobin 13.0 - 17.0 g/dL 11.0(L) 12.0(L) 11.3(L)  Hematocrit 39.0 - 52.0 % 33.6(L) 36.3(L) 32.7(L)  Platelets 150 - 400 K/uL 191 232 159    CMP Latest Ref Rng & Units 06/05/2016 06/04/2016 06/03/2016  Glucose 65 - 99 mg/dL 181(H) 158(H) 158(H)  BUN 6 - 20 mg/dL 15 12 11   Creatinine 0.61 - 1.24 mg/dL 0.79 0.90 0.82  Sodium 135 - 145 mmol/L 130(L) 132(L) 133(L)  Potassium 3.5 - 5.1 mmol/L 3.7 3.9 3.5  Chloride 101 - 111 mmol/L 94(L) 96(L) 98(L)  CO2 22 - 32 mmol/L 25 25 26   Calcium 8.9 - 10.3 mg/dL 8.7(L) 9.2 8.9    Total Protein 6.5 - 8.1 g/dL - - -  Total Bilirubin 0.3 - 1.2 mg/dL - - -  Alkaline Phos 38 - 126 U/L - - -  AST 15 - 41 U/L - - -  ALT 17 - 63 U/L - - -   CA 19.9 05/11/2016: 23.2  CEA 05/14/2016: 6,576  PATHOLOGY  Diagnosis 05/27/2016 FINE NEEDLE ASPIRATION, ENDOSCOPIC, LEFT UPPER QUADRANT MASS (SPECIMEN 1 OF 1 COLLECTED 05/27/16): MALIGNANT CELLS CONSISTENT WITH ADENOCARCINOMA, SEE COMMENT. Preliminary Diagnosis Intraoperative Diagnosis: Atypical. Additional tissue requested. ([facility])  Diagnosis 05/13/2016 1. Colon, polyp(s), transverse - TUBULAR ADENOMA(S). - HIGH GRADE DYSPLASIA IS NOT IDENTIFIED. 2. Colon, biopsy, mass - BENIGN POLYPOID COLORECTAL MUCOSA WITH A LYMPHOID AGGREGATE. - THERE IS NO EVIDENCE OF MALIGNANCY - SEE COMMENT. Microscopic Comment 2. The endoscopic impression of a colonic mass is noted. The histologic features present may not account for that impression. Correlation is necessary. (JBK:gt, 05/14/16)  RADIOGRAPHIC STUDIES: I have personally reviewed the radiological images as listed and agreed with the findings in the report. Dg Chest 2 View  Result Date: 06/02/2016 CLINICAL DATA:  Pancreatic cancer, shortness of breath for 2 weeks EXAM: CHEST  2 VIEW COMPARISON:  05/09/2016 FINDINGS: Cardiomediastinal silhouette is stable. No pulmonary edema. There is small left pleural effusion left basilar atelectasis or infiltrate. Mild degenerative changes lower thoracic and lumbar spine. IMPRESSION: No pulmonary edema. Small left pleural effusion with left basilar atelectasis or infiltrate. Electronically Signed   By: Lahoma Crocker M.D.   On: 06/02/2016 13:51   Ct Angio Chest Pe W And/or Wo Contrast  Result Date: 06/02/2016 CLINICAL DATA:  Shortness of breath, possible PE EXAM: CT ANGIOGRAPHY CHEST WITH CONTRAST TECHNIQUE: Multidetector CT imaging of the chest was performed using the standard protocol during bolus administration of intravenous contrast. Multiplanar CT  image reconstructions and MIPs were obtained to evaluate the vascular anatomy. CONTRAST:  80 cc Isovue COMPARISON:  None FINDINGS: Cardiovascular: Cardiomegaly is noted. Atherosclerotic calcifications of thoracic aorta. Extensive atherosclerotic calcifications of coronary arteries. The study is of excellent technical quality. No pulmonary  embolus is noted. Mediastinum/Nodes: There is a calcified right hilar lymph node axial image 49 measures 6.6 mm probable prior granulomatous disease. Multiple mediastinal calcified lymph nodes are noted probable prior granulomatous disease. Bilateral hilar lymph nodes calcified. Central airways are patent. Lungs/Pleura: There is bilateral small pleural effusion. Bilateral lower lobe bronchitic changes are noted. Mild bronchitic changes are noted right middle lobe. There is small atelectasis or infiltrate in left lower lobe posteriorly retrocardiac. No pulmonary edema is noted. Upper Abdomen: The visualized upper abdomen shows extensive atherosclerotic calcifications of splenic artery. Again noted is irregular soft tissue density in splenic hilum suspicious for pancreatic malignancy. Musculoskeletal: No destructive bony lesions are noted. Osteopenia and degenerative changes thoracic spine. Review of the MIP images confirms the above findings. IMPRESSION: 1. No pulmonary embolus is noted. 2. Extensive atherosclerotic calcifications of coronary arteries. Cardiomegaly noted. 3. Bilateral small pleural effusion. Bilateral lower lobe bronchitic changes. Mild bronchitic changes right middle lobe. There is small atelectasis or infiltrate in left lower lobe posterior medially retrocardiac. 4. Bilateral hilar and mediastinal calcified lymph nodes probable prior granulomatous disease. 5. No pulmonary edema. 6. Degenerative changes thoracic spine. Electronically Signed   By: Lahoma Crocker M.D.   On: 06/02/2016 18:51   Mr Abdomen W Wo Contrast  Result Date: 05/16/2016 CLINICAL DATA:  Outside  CT demonstrating mass extending from the descending colon into the inferior splenic hilum with splenic involvement. Diabetes. Hypertension. EXAM: MRI ABDOMEN WITHOUT AND WITH CONTRAST TECHNIQUE: Multiplanar multisequence MR imaging of the abdomen was performed both before and after the administration of intravenous contrast. CONTRAST:  20mL MULTIHANCE GADOBENATE DIMEGLUMINE 529 MG/ML IV SOLN COMPARISON:  Plain films of 05/12/2016.  CT of 05/09/2016 FINDINGS: Moderate to marked motion degradation throughout. Lower chest: Mild cardiomegaly.  Small bilateral pleural effusions. Hepatobiliary: 1.0 cm high left hepatic lobe lesion image 18/ series 12003. This is hypoenhancing and relatively well-circumscribed. Not well evaluated on T2 weighted images. Normal gallbladder, without biliary ductal dilatation. Pancreas: suboptimally evaluated, secondary to motion. marked atrophy and duct dilatation within the body, including on image 24/series 3 and image 25/series 9. Soft tissue fullness centered about the pancreatic tail with extension into the splenic hilum. Example of more solid-appearing soft tissue fullness at 3.3 x 2.6 cm on image 48/series 12003. More cystic component (likely representing necrosis) positioned in the splenic hilum measures 2.4 x 2.4 cm on image 48/ series 12003. Spleen: Involve by direct tumor spread, as above. Extension into the splenic parenchyma including on image 47/ series 12003. Adrenals/Urinary Tract: Normal adrenal glands. Renal cortical thinning, especially in the interpolar right kidney. No hydronephrosis. Stomach/Bowel: Grossly normal stomach. No high-grade bowel obstruction identified. There is suggestion of wall thickening involving the distal transverse colon and proximal descending colon. Example image 56 and 58/ series 12003. Vascular/Lymphatic: Advanced aortic and branch vessel atherosclerosis. No retroperitoneal or retrocrural adenopathy. Other:  Small volume ascites. Musculoskeletal:  No acute osseous abnormality. IMPRESSION: 1. Moderate to markedly motion degraded exam. 2. In combination with the 05/09/2016 CT findings, tumor extension into the splenic hilum is favored to arise from the pancreatic tail. Secondary involvement of the splenic flexure of the colon. Colonic primary with direct extension into the spleen is felt less likely, given absence of well-defined circumferential colonic mass or high-grade obstruction. 3. Indeterminate left hepatic lobe lesion. Recommend attention on follow-up. 4. Ascites and bilateral pleural effusions suggests fluid overload. 5. Left-sided colonic wall thickening suggested. Considerations include infectious colitis or low-grade ischemia secondary to direct tumor involvement. Electronically Signed  By: Abigail Miyamoto M.D.   On: 05/16/2016 09:45   Dg Abd 2 Views  Result Date: 05/12/2016 CLINICAL DATA:  Colonic mass, abdominal pain EXAM: ABDOMEN - 2 VIEW COMPARISON:  None. FINDINGS: There is mild gaseous distension of small bowel in left mid abdomen suspicious for ileus. Moderate distension of the right colon and proximal transverse colon with gas suspicious for ileus or early colonic obstruction. Nonspecific air-fluid levels are noted within colon. No evidence of free abdominal air. IMPRESSION: There is mild gaseous distension of small bowel in left mid abdomen suspicious for ileus. Moderate distension of the right colon and proximal transverse colon with gas suspicious for ileus or early colonic obstruction. Nonspecific air-fluid levels are noted within colon. No evidence of free abdominal air. Electronically Signed   By: Lahoma Crocker M.D.   On: 05/12/2016 15:55    EUS Dr. Ardis Hughs 05/27/2016 - Significant [place] artifact due to heavily calcified blood vessels and air filled bowel limited the examination of the LUQ, tail of pancreas lesion that was described on recent MRI however I was able to see a poorly defined 1.8cm soft tissue mass in the region and sampled  it with transgastric FNA. Preliminary cytology is suspicious for malignancy, awaiting final readings.  Colonoscopy Dr. Fuller Plan, 05/13/2016 - One 8 mm polyp in the proximal transverse colon, removed with a cold snare. Resected and retrieved. - Internal hemorrhoids. - Diverticulosis in the sigmoid colon. - Stricture in the proximal descending colon. Biopsied. Tattooed at distal margin. - The examination was otherwise normal on direct and retroflexion views.  ASSESSMENT & PLAN:  83 y.o. gentleman with AF, CHF with EF 35%, DM, CVA, presented with abdominal pain   1. Pancreatic adenocarcinoma, cTxNxMx -I reviewed the patient's work up thus far including hospitalization, imaging, and pathology results -I discussed the above findings with pt and her family. Based on CT, MRI and EUS findings, this is most consistent with pancreatic tail adenocarcinoma, invading spleen, and possible colon at the splenic flexure. No definitive evidence of metastasis on scan, except an indeterminate 1cm left hepatic lobe lesion.  -we reviewed the agressive nature of pancreatic cancer, and high risk of recurrence even after surgery --I reviewed treatment options including surgery, palliative radiation therapy, and chemotherapy. Although this is potentially resectable pancreatic cancer, given his advanced age and comorbilities especially CHF, the extensive surgery, he is likely not a candidate for surgery. He is also not a candidate for chemotherapy.  -I discussed the option of palliative radiation for his pain and preventing colonic obstruction. He is interested.  -I will discuss the patient's case at multidisciplinary tumor board next Wednesday. I will speak with radiation oncologist Dr. Lisbeth Renshaw to see if radiation therapy would be recommended. -We discussed the option of palliative care alone and hospice, which is appropriate for him. This can be considered for now, or after he completes radiation.    2. Abdominal pain,  nausea, and generalized weakness -Encouraged the patient to eat smaller and more frequent meals to avoid abdominal discomfort with eating.  -He takes phenergan as needed for nausea  -nutritional supplement if needed   3. DM, HTN, AF and CHF with EF 35% -follow up with PCP   4. Goal of care discussion  -We discussed the incurable nature of his cancer, and the overall poor prognosis, we discussed that median survival is less than 6 months  -The patient understands the goal of care is palliative. -We discussed referral to hospice for palliative care. The patient and  family are not interested at this time. -I recommend DNR/DNI, he will think about it    Follow up: -I will discuss the patient's case at multidisciplinary tumor board next Wednesday. I will speak with radiation oncologist Dr. Lisbeth Renshaw to see if palliative radiation therapy would be recommended. -I will call his daughter afterwards, and make f/u appointment, likely at the end of his radiation    No orders of the defined types were placed in this encounter.   All questions were answered. The patient knows to call the clinic with any problems, questions or concerns. I spent 55 minutes counseling the patient face to face. The total time spent in the appointment was 60 minutes and more than 50% was on counseling.  This document serves as a record of services personally performed by Truitt Merle, MD. It was created on her behalf by Arlyce Harman, a trained medical scribe. The creation of this record is based on the scribe's personal observations and the provider's statements to them. This document has been checked and approved by the attending provider.     Truitt Merle, MD 06/07/2016   Addendum: I presented his case in GI tumor board this morning. Dr. Barry Dienes confirmed that he is not a candidate for curative pancreatic surgery, she also does not recommend diverging colostomy due to his advanced age and comorbidities. We recommend patient  to consider palliative radiation, Dr. Lisbeth Renshaw would be happy to see the patient. GI Dr. Ardis Hughs does not think colonic stenting is an option due to the location and external compression from the tumor. We also discussed hospice, which will be appropriate for patient. I have called patient's daughter [name] and discuss the above with her. She will discuss with patient and call me back about the decisions. Patient is currently admitted to [facility].  Truitt Merle  06/12/2016

## 2016-06-04 ENCOUNTER — Ambulatory Visit: Payer: Self-pay | Admitting: Licensed Clinical Social Worker

## 2016-06-04 LAB — BASIC METABOLIC PANEL
Anion gap: 11 (ref 5–15)
BUN: 12 mg/dL (ref 6–20)
CHLORIDE: 96 mmol/L — AB (ref 101–111)
CO2: 25 mmol/L (ref 22–32)
CREATININE: 0.9 mg/dL (ref 0.61–1.24)
Calcium: 9.2 mg/dL (ref 8.9–10.3)
GFR calc non Af Amer: >60 mL/min (ref >60)
Glucose, Bld: 158 mg/dL — ABNORMAL HIGH (ref 65–99)
POTASSIUM: 3.9 mmol/L (ref 3.5–5.1)
Sodium: 132 mmol/L — ABNORMAL LOW (ref 135–145)

## 2016-06-04 LAB — GLUCOSE, CAPILLARY
GLUCOSE-CAPILLARY: 222 mg/dL — AB (ref 65–99)
Glucose-Capillary: 143 mg/dL — ABNORMAL HIGH (ref 65–99)
Glucose-Capillary: 187 mg/dL — ABNORMAL HIGH (ref 65–99)
Glucose-Capillary: 148 mg/dL — ABNORMAL HIGH (ref 65–99)

## 2016-06-04 LAB — HEMOGLOBIN A1C
Hgb A1c MFr Bld: 6.6 % — ABNORMAL HIGH (ref 4.8–5.6)
Mean Plasma Glucose: 143 mg/dL

## 2016-06-04 MED ORDER — GLUCERNA SHAKE PO LIQD
237.0000 mL | Freq: Three times a day (TID) | ORAL | Status: DC
  Administered 2016-06-04 – 2016-06-05 (×3): 237 mL via ORAL
  Filled 2016-06-04 (×4): qty 237

## 2016-06-04 MED ORDER — ADULT MULTIVITAMIN LIQUID CH
15.0000 mL | Freq: Every day | ORAL | Status: DC
  Administered 2016-06-04 – 2016-06-05 (×2): 15 mL via ORAL
  Filled 2016-06-04 (×2): qty 15

## 2016-06-04 NOTE — Consult Note (Signed)
   [facility] Inpatient Consult   06/04/2016  DENCIL CAYSON 1925/06/07 [id number]    Mr. Bucklin is active with [facility] program. Please see chart review then notes for further patient outreach details from [facility] team.   Spoke with Mr Jenkinson, daughter, and wife at bedside. They are agreeable to ongoing [facility] follow up. State they are glad [facility] is following. Daughter states they are going to start looking for lower sodium soups since he has CHF. They report that Mr. Fanfan is unable to tolerate solid food so he has been consuming liquid meals instead. They endorse that inpatient dietician has come by to discuss diet restrictions as well.   Contact information provided. Will update [facility] Community team of bedside visit.  Will update inpatient RNCM as well.    Marthenia Rolling, MSN-Ed, RN,BSN [facility] Liaison [phone]

## 2016-06-04 NOTE — Progress Notes (Signed)
PT Cancellation Note  Patient Details Name: Benjamin Hurley MRN: [id number] DOB: 1925/05/11   Cancelled Treatment:    Reason Eval/Treat Not Completed: Attmepted PT tx session x2 today. Pt declined to participate on both attempts. Will check back another day.    [name], MPT Pager: [phone]

## 2016-06-04 NOTE — Progress Notes (Signed)
Initial Nutrition Assessment  DOCUMENTATION CODES:   Severe malnutrition in context of acute illness/injury  INTERVENTION:   -Increase Glucerna shakes to TID, each provides 220 kcal and 10 grams of protein -Provided "Low Sodium Nutrition Therapy" handout from [employer] -Provide liquid MVI -Encourage PO intake -RD to continue to monitor  NUTRITION DIAGNOSIS:   Malnutrition related to acute illness, cancer and cancer related treatments as evidenced by percent weight loss, moderate depletion of body fat, severe depletion of muscle mass.  GOAL:   Patient will meet greater than or equal to 90% of their needs  MONITOR:   PO intake, Supplement acceptance, Labs, Weight trends, I & O's  REASON FOR ASSESSMENT:   Consult Diet education  ASSESSMENT:   83 y.o. male with medical history significant of DM with peripheral neuropathy; HTN; carotid steonsis; BPH; and afib on Eliquis who was recently hospitalized (3/22-3/30) for afib with RVR and new diagnosis of what was determined to be pancreatic cancer which the family reports is invading the spleen and partially blocking the colon.  Discharged on 3/30 and the family thought that he was doing well.  However, he now says that he has been having problems with SOB since his last discharge, but he just told his family today.  Severe SOB with lying down.  He currently reports that he feels "stuffy" with lying down but a little bit better by sitting up.  +productive cough of greenish sputum, +post-tussive emesis.  He did feel his heart fluttering on Tuesday or Wednesday of last week.  +LUQ abdominal pain, mild nausea.  +LE edema, worsening despite elevating.  No fevers, no sick contacts.  Echo on 3/26 showed EF 30-35% with diffuse hypokinesis.  Patient in room with family (daughter, wife) at bedside. Pt's wife is HOH. Pt's daughter aided in education for pt and pt's wife to understand.  RD was consulted for low sodium diet  education. Provided handout and highlighted recommended sodium amounts.  Pt follows a mostly liquid diet d/t pancreatic cancer which is blocking part of the colon. Pt is also on a fluid restriction. At this time RD only provided low sodium education. Pt consumed 100% of his full liquid breakfast this morning (providing 308 kcal, 9g protein).  Upon further inquiry, pt presented with moderate fat and severe muscle depletion. Pt states he drinks 1 Glucerna Shake a day. Does not meet needs with fluid diet alone. Will need additional protein supplements a day. Will increase order for Glucerna.   Pt's daughter states they have been researching Or[employer] supplement options for the patient. Informed pt's daughter that it is a great protein option but is not available on our formulary.  Per chart review, pt has lost 12 lb since 3/28 (8% wt loss x 3 weeks, significant for time frame). Nutrition-Focused physical exam completed. Findings are moderate fat depletion, severe muscle depletion, and mild edema.   Medications: Colace capsule BID, IV Lasix BID, Protonix tablet daily, Carafate suspension BID Labs reviewed: CBGs: 143-222 Low Na  Diet Order:  Diet full liquid Room service appropriate? Yes; Fluid consistency: Thin; Fluid restriction: 1500 mL Fluid  Skin:  Reviewed, no issues  Last BM:  4/15  Height:   Ht Readings from Last 1 Encounters:  06/02/16 5\' 6"  (1.676 m)    Weight:   Wt Readings from Last 1 Encounters:  06/04/16 142 lb 10.2 oz (64.7 kg)    Ideal Body Weight:  64.5 kg  BMI:  Body mass index is 23.02  kg/m.  Estimated Nutritional Needs:   Kcal:  1900-2100  Protein:  95-105g  Fluid:  Per MD  EDUCATION NEEDS:   Education needs addressed (Low Na diet within a full liquid diet with a fluid restriction)  Clayton Bibles, MS, RD, LDN Pager: [phone] After Hours Pager: [phone]

## 2016-06-04 NOTE — Progress Notes (Addendum)
PROGRESS NOTE    Benjamin Hurley  OMB:[id number] DOB: 1925/04/23 DOA: 06/02/2016 PCP: Cleophas Dunker, MD    Brief Narrative:  83 y.o. male with medical history significant of DM with peripheral neuropathy; HTN; carotid steonsis; BPH; and afib on Eliquis who was recently hospitalized (3/22-3/30) for afib with RVR and new diagnosis of what was determined to be pancreatic cancer which the family reports is invading the spleen and partially blocking the colon.  Discharged on 3/30 and the family thought that he was doing well.  However, he now says that he has been having problems with SOB since his last discharge, but he just told his family today.  Severe SOB with lying down.  He currently reports that he feels "stuffy" with lying down but a little bit better by sitting up.  +productive cough of greenish sputum, +post-tussive emesis.  He did feel his heart fluttering on Tuesday or Wednesday of last week.  +LUQ abdominal pain, mild nausea.  +LE edema, worsening despite elevating.  No fevers, no sick contacts.  Echo on 3/26 showed EF 30-35% with diffuse hypokinesis.  Oncology at [facility] to review the records to determine if they will pay for him to go outside their system for the pancreatic cancer.  He has an appointment with oncology here on Friday with Dr. Krista Blue.  During his hospital course, patient is continued with IV Lasix with notable improvement. Lower extremity edema has improved  Assessment & Plan:   Principal Problem:   Acute exacerbation of CHF (congestive heart failure) (HCC) Active Problems:   Atrial fibrillation with RVR (HCC)   Diabetes mellitus, type 2 (HCC)   Chronic systolic CHF (congestive heart failure) (HCC)   Pancreatic mass  CHF exacerbation -Patient with known depressed EF (30-35% on 3/18 Echo) presenting with worsening SOB and LE edema since last hospitalization -Patient has been almost exclusively eating normal-sodium beef broth since discharge due to his inability  to tolerate other than liquid diet -Chest CT shows mild changes c/w bronchitis and small atelectasis or infiltrate -Elevated BNP 1,005.6 at presentation -Reports ASA allergy -continued on Lisinopril 5 mg daily -Continue lopressor -Patient is improving with lasix. Will continue to diurese as tolerated. LE with residual pitting edema, improved  Afib -Remains rate controlled -on eliquis for secondary stroke prevention  Diabetes -Patient reportedly have been taken off Lantus -Will continue SSI as needed  Pancreatic cancer -Pt reportedly has oncology appt scheduled for 4/20 at [place] stable for now  DVT prophylaxis: eliquis Code Status: DNR Family Communication: Pt in room, family at bedside Disposition Plan: uncertain at this time  Consultants:     Procedures:     Antimicrobials: Anti-infectives    None      Subjective: Without complaints  Objective: Vitals:   06/03/16 2112 06/04/16 0446 06/04/16 1034 06/04/16 1500  BP: 112/60 122/62 110/70 110/60  Pulse:  76 86 82  Resp: 18 20  18   Temp: 97.8 F (36.6 C) 97.5 F (36.4 C)  97.7 F (36.5 C)  TempSrc: Oral Oral  Oral  SpO2: 98% 100%  100%  Weight:  64.7 kg (142 lb 10.2 oz)    Height:        Intake/Output Summary (Last 24 hours) at 06/04/16 1659 Last data filed at 06/04/16 1300  Gross per 24 hour  Intake              840 ml  Output  700 ml  Net              140 ml   Filed Weights   06/02/16 2009 06/03/16 0649 06/04/16 0446  Weight: 68.9 kg (151 lb 14.4 oz) 67.1 kg (147 lb 14.9 oz) 64.7 kg (142 lb 10.2 oz)    Examination:  General exam: asleep, easily arousable, in nad Respiratory system: normal resp effort, no audible wheezing Cardiovascular system: regular rate, s1-2  Gastrointestinal system: soft, nondistended, pos BS Central nervous system: cn2-12 grossly intact, strength intact Extremities: perfused, no clubbing. Skin: normal skin turgor, no notable skin lesions  seen Psychiatry: mood normal/ no visual hallucinations  Data Reviewed: I have personally reviewed following labs and imaging studies  CBC:  Recent Labs Lab 06/02/16 1406 06/03/16 0512  WBC 8.2 6.2  NEUTROABS  --  4.2  HGB 12.0* 11.0*  HCT 36.3* 33.6*  MCV 86.8 86.4  PLT 232 703   Basic Metabolic Panel:  Recent Labs Lab 06/02/16 1406 06/03/16 0512 06/04/16 0441  NA 133* 133* 132*  K 4.6 3.5 3.9  CL 102 98* 96*  CO2 22 26 25   GLUCOSE 179* 158* 158*  BUN 11 11 12   CREATININE 0.75 0.82 0.90  CALCIUM 9.1 8.9 9.2   GFR: Estimated Creatinine Clearance: 51.2 mL/min (by C-G formula based on SCr of 0.9 mg/dL). Liver Function Tests:  Recent Labs Lab 06/02/16 1406  AST 22  ALT 13*  ALKPHOS 56  BILITOT 0.8  PROT 7.1  ALBUMIN 3.6   No results for input(s): LIPASE, AMYLASE in the last 168 hours. No results for input(s): AMMONIA in the last 168 hours. Coagulation Profile: No results for input(s): INR, PROTIME in the last 168 hours. Cardiac Enzymes: No results for input(s): CKTOTAL, CKMB, CKMBINDEX, TROPONINI in the last 168 hours. BNP (last 3 results) No results for input(s): PROBNP in the last 8760 hours. HbA1C:  Recent Labs  06/03/16 0512  HGBA1C 6.6*   CBG:  Recent Labs Lab 06/03/16 1155 06/03/16 1740 06/03/16 2128 06/04/16 0747 06/04/16 1154  GLUCAP 200* 98 189* 143* 222*   Lipid Profile: No results for input(s): CHOL, HDL, LDLCALC, TRIG, CHOLHDL, LDLDIRECT in the last 72 hours. Thyroid Function Tests: No results for input(s): TSH, T4TOTAL, FREET4, T3FREE, THYROIDAB in the last 72 hours. Anemia Panel: No results for input(s): VITAMINB12, FOLATE, FERRITIN, TIBC, IRON, RETICCTPCT in the last 72 hours. Sepsis Labs: No results for input(s): PROCALCITON, LATICACIDVEN in the last 168 hours.  No results found for this or any previous visit (from the past 240 hour(s)).   Radiology Studies: Ct Angio Chest Pe W And/or Wo Contrast  Result Date:  06/02/2016 CLINICAL DATA:  Shortness of breath, possible PE EXAM: CT ANGIOGRAPHY CHEST WITH CONTRAST TECHNIQUE: Multidetector CT imaging of the chest was performed using the standard protocol during bolus administration of intravenous contrast. Multiplanar CT image reconstructions and MIPs were obtained to evaluate the vascular anatomy. CONTRAST:  80 cc Isovue COMPARISON:  None FINDINGS: Cardiovascular: Cardiomegaly is noted. Atherosclerotic calcifications of thoracic aorta. Extensive atherosclerotic calcifications of coronary arteries. The study is of excellent technical quality. No pulmonary embolus is noted. Mediastinum/Nodes: There is a calcified right hilar lymph node axial image 49 measures 6.6 mm probable prior granulomatous disease. Multiple mediastinal calcified lymph nodes are noted probable prior granulomatous disease. Bilateral hilar lymph nodes calcified. Central airways are patent. Lungs/Pleura: There is bilateral small pleural effusion. Bilateral lower lobe bronchitic changes are noted. Mild bronchitic changes are noted right middle lobe. There is  small atelectasis or infiltrate in left lower lobe posteriorly retrocardiac. No pulmonary edema is noted. Upper Abdomen: The visualized upper abdomen shows extensive atherosclerotic calcifications of splenic artery. Again noted is irregular soft tissue density in splenic hilum suspicious for pancreatic malignancy. Musculoskeletal: No destructive bony lesions are noted. Osteopenia and degenerative changes thoracic spine. Review of the MIP images confirms the above findings. IMPRESSION: 1. No pulmonary embolus is noted. 2. Extensive atherosclerotic calcifications of coronary arteries. Cardiomegaly noted. 3. Bilateral small pleural effusion. Bilateral lower lobe bronchitic changes. Mild bronchitic changes right middle lobe. There is small atelectasis or infiltrate in left lower lobe posterior medially retrocardiac. 4. Bilateral hilar and mediastinal calcified  lymph nodes probable prior granulomatous disease. 5. No pulmonary edema. 6. Degenerative changes thoracic spine. Electronically Signed   By: Lahoma Crocker M.D.   On: 06/02/2016 18:51    Scheduled Meds: . apixaban  5 mg Oral BID  . docusate sodium  100 mg Oral BID  . feeding supplement (GLUCERNA SHAKE)  237 mL Oral TID WC  . furosemide  40 mg Intravenous BID  . gabapentin  300 mg Oral QHS  . insulin aspart  0-5 Units Subcutaneous QHS  . insulin aspart  0-9 Units Subcutaneous TID WC  . latanoprost  1 drop Both Eyes QHS  . lisinopril  5 mg Oral Daily  . metoprolol  50 mg Oral BID  . multivitamin  15 mL Oral Daily  . pantoprazole  40 mg Oral Daily  . polyvinyl alcohol  1 drop Both Eyes BID  . sodium chloride flush  3 mL Intravenous Q12H  . sucralfate  1 g Oral BID   Continuous Infusions: . sodium chloride       LOS: 2 days   Stpehen Petitjean, Orpah Melter, MD [facility] Pager [phone]  If 7PM-7AM, please contact night-coverage [email or web address] Password [email or web address] 06/04/2016, 4:59 PM

## 2016-06-05 ENCOUNTER — Other Ambulatory Visit: Payer: Self-pay | Admitting: Licensed Clinical Social Worker

## 2016-06-05 DIAGNOSIS — R0602 Shortness of breath: Secondary | ICD-10-CM

## 2016-06-05 DIAGNOSIS — I10 Essential (primary) hypertension: Secondary | ICD-10-CM

## 2016-06-05 DIAGNOSIS — I4891 Unspecified atrial fibrillation: Secondary | ICD-10-CM

## 2016-06-05 DIAGNOSIS — E43 Unspecified severe protein-calorie malnutrition: Secondary | ICD-10-CM

## 2016-06-05 DIAGNOSIS — I5023 Acute on chronic systolic (congestive) heart failure: Secondary | ICD-10-CM

## 2016-06-05 DIAGNOSIS — I482 Chronic atrial fibrillation: Secondary | ICD-10-CM

## 2016-06-05 DIAGNOSIS — E1159 Type 2 diabetes mellitus with other circulatory complications: Secondary | ICD-10-CM

## 2016-06-05 DIAGNOSIS — K869 Disease of pancreas, unspecified: Secondary | ICD-10-CM

## 2016-06-05 LAB — BASIC METABOLIC PANEL
Anion gap: 11 (ref 5–15)
BUN: 15 mg/dL (ref 6–20)
CALCIUM: 8.7 mg/dL — AB (ref 8.9–10.3)
CHLORIDE: 94 mmol/L — AB (ref 101–111)
CO2: 25 mmol/L (ref 22–32)
CREATININE: 0.79 mg/dL (ref 0.61–1.24)
GFR calc non Af Amer: >60 mL/min (ref >60)
Glucose, Bld: 181 mg/dL — ABNORMAL HIGH (ref 65–99)
Potassium: 3.7 mmol/L (ref 3.5–5.1)
SODIUM: 130 mmol/L — AB (ref 135–145)

## 2016-06-05 LAB — GLUCOSE, CAPILLARY
Glucose-Capillary: 159 mg/dL — ABNORMAL HIGH (ref 65–99)
Glucose-Capillary: 186 mg/dL — ABNORMAL HIGH (ref 65–99)

## 2016-06-05 MED ORDER — GLIPIZIDE 5 MG PO TABS: 5.0000 mg | ORAL_TABLET | Freq: Two times a day (BID) | ORAL | 1 refills | Status: DC

## 2016-06-05 MED ORDER — FUROSEMIDE 40 MG PO TABS: 40.0000 mg | ORAL_TABLET | Freq: Every day | ORAL | 1 refills | Status: DC

## 2016-06-05 MED ORDER — LISINOPRIL 5 MG PO TABS: 5.0000 mg | ORAL_TABLET | Freq: Every day | ORAL | 1 refills | Status: DC

## 2016-06-05 NOTE — Progress Notes (Signed)
OT Cancellation Note  Patient Details Name: Benjamin Hurley MRN: [id number] DOB: September 08, 1925   Cancelled Treatment:    Reason Eval/Treat Not Completed: Other (comment); spoke with Pt and Pt's spouse, Pt is being discharged home today, declining getting dressed at this time (still need to remove telemetry), and declining OT treatment session at this time. Will check back if time permits and if Pt has not yet been discharged.   Lou Cal, OT Pager [phone] 06/05/2016   [name] 06/05/2016, 1:49 PM

## 2016-06-05 NOTE — Progress Notes (Signed)
Physical Therapy Treatment Patient Details Name: Benjamin Hurley MRN: [id number] DOB: August 16, 1925 Today's Date: 06/05/2016    History of Present Illness 83 yo male admitted with CHF. Hx of DM, neuropathy, CVA, pancreatic cancer, A fib.     PT Comments    Progressing with mobility. Strength and activity tolerance improving.    Follow Up Recommendations  Home health PT;Supervision/Assistance - 24 hour     Equipment Recommendations  None recommended by PT    Recommendations for Other Services       Precautions / Restrictions Precautions Precautions: Fall Restrictions Weight Bearing Restrictions: No    Mobility  Bed Mobility Overal bed mobility: Needs Assistance Bed Mobility: Sit to Supine       Sit to supine: Supervision      Transfers Overall transfer level: Needs assistance Equipment used: Rolling walker (2 wheeled) Transfers: Sit to/from Stand Sit to Stand: Min guard         General transfer comment: verbal cues for hand placement. close guard for safety  Ambulation/Gait Ambulation/Gait assistance: Min guard Ambulation Distance (Feet): 125 Feet Assistive device: Rolling walker (2 wheeled) Gait Pattern/deviations: Step-through pattern;Decreased stride length;Trunk flexed;Steppage;Decreased dorsiflexion - left     General Gait Details: Close guard for safety. Steppage gait pattern L. Pt demonstrated improved activity tolerance on today.    Stairs            Wheelchair Mobility    Modified Rankin (Stroke Patients Only)       Balance                                            Cognition Arousal/Alertness: Awake/alert Behavior During Therapy: WFL for tasks assessed/performed Overall Cognitive Status: Within Functional Limits for tasks assessed                                        Exercises      General Comments        Pertinent Vitals/Pain Pain Assessment: No/denies pain    Home Living                       Prior Function            PT Goals (current goals can now be found in the care plan section) Progress towards PT goals: Progressing toward goals    Frequency    Min 3X/week      PT Plan Current plan remains appropriate    Co-evaluation             End of Session Equipment Utilized During Treatment: Gait belt Activity Tolerance: Patient tolerated treatment well Patient left: in bed;with call bell/phone within reach;with family/visitor present   PT Visit Diagnosis: Muscle weakness (generalized) (M62.81);Difficulty in walking, not elsewhere classified (R26.2)     Time: 4888-9169 PT Time Calculation (min) (ACUTE ONLY): 12 min  Charges:  $Gait Training: 8-22 mins                    G Codes:          [name], MPT Pager: [phone]

## 2016-06-05 NOTE — Discharge Summary (Signed)
Physician Discharge Summary  Benjamin Hurley ZYT:[id number] DOB: [birth year] DOA: 06/02/2016  PCP: Cleophas Dunker, MD  Admit date: 06/02/2016 Discharge date: 06/05/2016  Time spent: 35 minutes  Recommendations for Outpatient Follow-up:  1. Repeat BMET to follow electrolytes and renal function  2. Reassess patient volume status and adjust heart failure medications as needed. 3. Follow CBG's and adjust hypoglycemic regimen as needed   Discharge Diagnoses:  Acute on chronic systolic HF Atrial fibrillation with RVR (HCC) Diabetes mellitus, type 2 (HCC) Pancreatic mass SOB (shortness of breath) Benign essential HTN moderate protein calorie malnutrition   Discharge Condition: stable and improved. Will discharge home with instructions to follow up with oncology and PCP as an outpatient. Also appointment will be set up for patient to establish care with cardiology service (Dr. Sallyanne Kuster)  Diet recommendation: patient advise to follow full liquid diet, to watch sodium intake and to use feeding supplements.  Filed Weights   06/03/16 0649 06/04/16 0446 06/05/16 0700  Weight: 67.1 kg (147 lb 14.9 oz) 64.7 kg (142 lb 10.2 oz) 64.1 kg (141 lb 5 oz)    History of present illness:  83 y.o.malewith medical history significant of DM with peripheral neuropathy; HTN; carotid steonsis; BPH; and afib on Eliquis who was recently hospitalized (3/22-3/30) for afib with RVR and new diagnosis of what was determined to be pancreatic cancer which the family reports is invading the spleen and partially blocking the colon. Discharged on 3/30 and the family thought that he was doing well. However, he now says that he has been having problems with SOB since his last discharge, but he just told his family today. Severe SOB with lying down. He currently reports that he feels "stuffy" with lying down but a little bit better by sitting up. +productive cough of greenish sputum, +post-tussive emesis. He did feel his  heart fluttering on Tuesday or Wednesday of last week. +LUQ abdominal pain, mild nausea. +LE edema, worsening despite elevating. No fevers, no sick contacts. Echo on 3/26 showed EF 30-35% with diffuse hypokinesis.   Hospital Course:  1-acute systolic heart failure -patient with volume optimization and currently denying SOB and CP -no JVD and no Le edema on exam -EF 30-35% -will discharge home on metoprolol, lisinopril and lasix -advice to watch sodium intake and to follow daily weights  2-A. Fib -chronic and rate controlled -will continue metoprolol and eliquis   3-Diabetes type 2 -per patient recently taken off insulin therapy -will discharge on glipizide -advise to follow low carb diet -close follow up as an outpatient on his CBG's and further adjustment to hypoglycemic regimen as needed   4-Pancreatic Cancer -outpatient follow up with oncology service to determine further treatment and evaluation.  5-Severe protein calorie malnutrition  -feeding recommendations as per nutritional service -patient advise to maintain adequate hydration .  6-essential HTN -will continue current antihypertensive regimen -patient advise to follow low sodium diet   Procedures: See below for x-ray reports   Consultations:  Oncology aware of consult  Cardiology (curbside; will follow up patient after discharge; they agree with medication initiated and current recommendations to patient)  Discharge Exam: Vitals:   06/04/16 2317 06/05/16 0700  BP: (!) 112/57 121/71  Pulse: 77 74  Resp:  20  Temp:  97.8 F (36.6 C)    General: afebrile, no CP and with significant improvement in SOB and swelling. Cardiovascular: rate controlled, irregular, no rubs or gallops, no JVD appreciated Respiratory: good air movement, no wheezing, no crackles appreciated  abd: soft,  no guarding, positive BS Extremities: no edema or cyanosis   Discharge Instructions   Discharge Instructions    (HEART  FAILURE PATIENTS) Call MD:  Anytime you have any of the following symptoms: 1) 3 pound weight gain in 24 hours or 5 pounds in 1 week 2) shortness of breath, with or without a dry hacking cough 3) swelling in the hands, feet or stomach 4) if you have to sleep on extra pillows at night in order to breathe.    Complete by:  As directed    Diet - low sodium heart healthy    Complete by:  As directed    Discharge instructions    Complete by:  As directed    Please arrange follow up with PCP in 10 days Cardiology office will call for follow up appointment (but ok, for you to contact office as well if no appointment given in the next 2 weeks) Maintain adequate hydration (no more than 2L of fluids daily) Please follow low sodium diet (around 2 to 2.5 gram daily) Follow low carbohydrates diet     Current Discharge Medication List    START taking these medications   Details  furosemide (LASIX) 40 MG tablet Take 1 tablet (40 mg total) by mouth daily. Qty: 30 tablet, Refills: 1    glipiZIDE (GLUCOTROL) 5 MG tablet Take 1 tablet (5 mg total) by mouth 2 (two) times daily before a meal. Qty: 60 tablet, Refills: 1    lisinopril (PRINIVIL,ZESTRIL) 5 MG tablet Take 1 tablet (5 mg total) by mouth daily. Qty: 30 tablet, Refills: 1      CONTINUE these medications which have NOT CHANGED   Details  acetaminophen (TYLENOL) 325 MG tablet Take 2 tablets (650 mg total) by mouth every 6 (six) hours as needed for moderate pain. Qty: 20 tablet, Refills: 0    apixaban (ELIQUIS) 5 MG TABS tablet Take 5 mg by mouth 2 (two) times daily.    carboxymethylcellulose (REFRESH PLUS) 0.5 % SOLN Place 1 drop into both eyes 2 (two) times daily.    docusate sodium (COLACE) 100 MG capsule Take 1 capsule (100 mg total) by mouth 2 (two) times daily. Qty: 10 capsule, Refills: 0    feeding supplement, GLUCERNA SHAKE, (GLUCERNA SHAKE) LIQD Take 237 mLs by mouth daily.    gabapentin (NEURONTIN) 100 MG capsule Take 300 mg by  mouth at bedtime.    latanoprost (XALATAN) 0.005 % ophthalmic solution Place 1 drop into both eyes at bedtime.    metoprolol (LOPRESSOR) 50 MG tablet Take 1 tablet (50 mg total) by mouth 2 (two) times daily. Qty: 60 tablet, Refills: 0    Multiple Vitamin (MULTIVITAMIN WITH MINERALS) TABS tablet Take 1 tablet by mouth daily. Qty: 30 tablet, Refills: 0    pantoprazole (PROTONIX) 40 MG tablet Take 40 mg by mouth daily.    promethazine (PHENERGAN) 25 MG tablet Take 12.5 mg by mouth every 4 (four) hours as needed for nausea or vomiting.    sucralfate (CARAFATE) 1 GM/10ML suspension Take 1 g by mouth 2 (two) times daily.      STOP taking these medications     metFORMIN (GLUCOPHAGE) 500 MG tablet        Allergies  Allergen Reactions  . Aspirin Other (See Comments)    Makes pt very sick   Follow-up Information    CASSADA, PEGGY, MD. Schedule an appointment as soon as possible for a visit in 10 day(s).   Specialty:  Nurse Practitioner Contact information:  [place] [address] [phone]        Sanda Klein, MD Follow up today.   Specialty:  Cardiology Why:  office will contcat you with appointment details  Contact information: [address] [place] [address] [phone]           The results of significant diagnostics from this hospitalization (including imaging, microbiology, ancillary and laboratory) are listed below for reference.    Significant Diagnostic Studies: Dg Chest 2 View  Result Date: 06/02/2016 CLINICAL DATA:  Pancreatic cancer, shortness of breath for 2 weeks EXAM: CHEST  2 VIEW COMPARISON:  05/09/2016 FINDINGS: Cardiomediastinal silhouette is stable. No pulmonary edema. There is small left pleural effusion left basilar atelectasis or infiltrate. Mild degenerative changes lower thoracic and lumbar spine. IMPRESSION: No pulmonary edema. Small left pleural effusion with left basilar atelectasis or infiltrate.  Electronically Signed   By: Lahoma Crocker M.D.   On: 06/02/2016 13:51   Ct Angio Chest Pe W And/or Wo Contrast  Result Date: 06/02/2016 CLINICAL DATA:  Shortness of breath, possible PE EXAM: CT ANGIOGRAPHY CHEST WITH CONTRAST TECHNIQUE: Multidetector CT imaging of the chest was performed using the standard protocol during bolus administration of intravenous contrast. Multiplanar CT image reconstructions and MIPs were obtained to evaluate the vascular anatomy. CONTRAST:  80 cc Isovue COMPARISON:  None FINDINGS: Cardiovascular: Cardiomegaly is noted. Atherosclerotic calcifications of thoracic aorta. Extensive atherosclerotic calcifications of coronary arteries. The study is of excellent technical quality. No pulmonary embolus is noted. Mediastinum/Nodes: There is a calcified right hilar lymph node axial image 49 measures 6.6 mm probable prior granulomatous disease. Multiple mediastinal calcified lymph nodes are noted probable prior granulomatous disease. Bilateral hilar lymph nodes calcified. Central airways are patent. Lungs/Pleura: There is bilateral small pleural effusion. Bilateral lower lobe bronchitic changes are noted. Mild bronchitic changes are noted right middle lobe. There is small atelectasis or infiltrate in left lower lobe posteriorly retrocardiac. No pulmonary edema is noted. Upper Abdomen: The visualized upper abdomen shows extensive atherosclerotic calcifications of splenic artery. Again noted is irregular soft tissue density in splenic hilum suspicious for pancreatic malignancy. Musculoskeletal: No destructive bony lesions are noted. Osteopenia and degenerative changes thoracic spine. Review of the MIP images confirms the above findings. IMPRESSION: 1. No pulmonary embolus is noted. 2. Extensive atherosclerotic calcifications of coronary arteries. Cardiomegaly noted. 3. Bilateral small pleural effusion. Bilateral lower lobe bronchitic changes. Mild bronchitic changes right middle lobe. There is  small atelectasis or infiltrate in left lower lobe posterior medially retrocardiac. 4. Bilateral hilar and mediastinal calcified lymph nodes probable prior granulomatous disease. 5. No pulmonary edema. 6. Degenerative changes thoracic spine. Electronically Signed   By: Lahoma Crocker M.D.   On: 06/02/2016 18:51   Mr Abdomen W Wo Contrast  Result Date: 05/16/2016 CLINICAL DATA:  Outside CT demonstrating mass extending from the descending colon into the inferior splenic hilum with splenic involvement. Diabetes. Hypertension. EXAM: MRI ABDOMEN WITHOUT AND WITH CONTRAST TECHNIQUE: Multiplanar multisequence MR imaging of the abdomen was performed both before and after the administration of intravenous contrast. CONTRAST:  29mL MULTIHANCE GADOBENATE DIMEGLUMINE 529 MG/ML IV SOLN COMPARISON:  Plain films of 05/12/2016.  CT of 05/09/2016 FINDINGS: Moderate to marked motion degradation throughout. Lower chest: Mild cardiomegaly.  Small bilateral pleural effusions. Hepatobiliary: 1.0 cm high left hepatic lobe lesion image 18/ series 12003. This is hypoenhancing and relatively well-circumscribed. Not well evaluated on T2 weighted images. Normal gallbladder, without biliary ductal dilatation. Pancreas: suboptimally evaluated, secondary to  motion. marked atrophy and duct dilatation within the body, including on image 24/series 3 and image 25/series 9. Soft tissue fullness centered about the pancreatic tail with extension into the splenic hilum. Example of more solid-appearing soft tissue fullness at 3.3 x 2.6 cm on image 48/series 12003. More cystic component (likely representing necrosis) positioned in the splenic hilum measures 2.4 x 2.4 cm on image 48/ series 12003. Spleen: Involve by direct tumor spread, as above. Extension into the splenic parenchyma including on image 47/ series 12003. Adrenals/Urinary Tract: Normal adrenal glands. Renal cortical thinning, especially in the interpolar right kidney. No hydronephrosis.  Stomach/Bowel: Grossly normal stomach. No high-grade bowel obstruction identified. There is suggestion of wall thickening involving the distal transverse colon and proximal descending colon. Example image 56 and 58/ series 12003. Vascular/Lymphatic: Advanced aortic and branch vessel atherosclerosis. No retroperitoneal or retrocrural adenopathy. Other:  Small volume ascites. Musculoskeletal: No acute osseous abnormality. IMPRESSION: 1. Moderate to markedly motion degraded exam. 2. In combination with the 05/09/2016 CT findings, tumor extension into the splenic hilum is favored to arise from the pancreatic tail. Secondary involvement of the splenic flexure of the colon. Colonic primary with direct extension into the spleen is felt less likely, given absence of well-defined circumferential colonic mass or high-grade obstruction. 3. Indeterminate left hepatic lobe lesion. Recommend attention on follow-up. 4. Ascites and bilateral pleural effusions suggests fluid overload. 5. Left-sided colonic wall thickening suggested. Considerations include infectious colitis or low-grade ischemia secondary to direct tumor involvement. Electronically Signed   By: Abigail Miyamoto M.D.   On: 05/16/2016 09:45   Dg Abd 2 Views  Result Date: 05/12/2016 CLINICAL DATA:  Colonic mass, abdominal pain EXAM: ABDOMEN - 2 VIEW COMPARISON:  None. FINDINGS: There is mild gaseous distension of small bowel in left mid abdomen suspicious for ileus. Moderate distension of the right colon and proximal transverse colon with gas suspicious for ileus or early colonic obstruction. Nonspecific air-fluid levels are noted within colon. No evidence of free abdominal air. IMPRESSION: There is mild gaseous distension of small bowel in left mid abdomen suspicious for ileus. Moderate distension of the right colon and proximal transverse colon with gas suspicious for ileus or early colonic obstruction. Nonspecific air-fluid levels are noted within colon. No evidence  of free abdominal air. Electronically Signed   By: Lahoma Crocker M.D.   On: 05/12/2016 15:55    Microbiology: No results found for this or any previous visit (from the past 240 hour(s)).   Labs: Basic Metabolic Panel:  Recent Labs Lab 06/02/16 1406 06/03/16 0512 06/04/16 0441 06/05/16 0540  NA 133* 133* 132* 130*  K 4.6 3.5 3.9 3.7  CL 102 98* 96* 94*  CO2 22 26 25 25   GLUCOSE 179* 158* 158* 181*  BUN 11 11 12 15   CREATININE 0.75 0.82 0.90 0.79  CALCIUM 9.1 8.9 9.2 8.7*   Liver Function Tests:  Recent Labs Lab 06/02/16 1406  AST 22  ALT 13*  ALKPHOS 56  BILITOT 0.8  PROT 7.1  ALBUMIN 3.6   No results for input(s): LIPASE, AMYLASE in the last 168 hours. No results for input(s): AMMONIA in the last 168 hours. CBC:  Recent Labs Lab 06/02/16 1406 06/03/16 0512  WBC 8.2 6.2  NEUTROABS  --  4.2  HGB 12.0* 11.0*  HCT 36.3* 33.6*  MCV 86.8 86.4  PLT 232 191   BNP: BNP (last 3 results)  Recent Labs  06/02/16 1412  BNP 1,005.6*   CBG:  Recent Labs Lab  06/04/16 1154 06/04/16 1703 06/04/16 2035 06/05/16 0741 06/05/16 1217  GLUCAP 222* 187* 148* 159* 186*    Signed:  Barton Dubois MD.  [facility] 06/05/2016, 2:43 PM

## 2016-06-05 NOTE — Patient Outreach (Signed)
Assessment:  Client admitted to [facility] for care on 06/02/16. Client is currently a patient at [facility]. Client is receiving nursing care as needed at [facility]. Client has been diagnosed with pancreatic cancer.  He has support from his spouse and from his daughter.  CSW spoke via phone with Frazier Richards, daughter of client, on 06/05/16. CSW verified identity of Danel Requena.[name] said that client was still at [facility] but that the plan was for him to discharge from the hospital to return home later that afternoon. [name] said that client and family had chosen for client not to receive home health support services on discharge from the hospital. [name] said that client was scheduled to meet with oncologist, Dr. Krista Blue, at [facility] for an appointment on 06/07/16. [name] said that spouse of client manages client's medications at home. [name] said that RN [name] had been providing [place] support for client.  CSW spoke with [name] about [facility] program services and encouraged client or [name] to call CSW at [phone] as needed to discuss social work needs of client.  CSW encouraged that client continue to communicate with CSW in next 30 days to discuss community resources of assistance for client. [name] was appreciative of [facility] services received by client.     Plan:  Client to communicate with CSW in next 30 days to discuss community resources of assistance for client..  CSW to collaborate with RN [name] in monitoring needs of client.  CSW to call client or Ashten Prats in 4 weeks to assess client needs at that time.  [name].Avyan Livesay MSW, LCSW Licensed Clinical Social Worker [facility] Care Management [phone]

## 2016-06-05 NOTE — Progress Notes (Signed)
Discharge to home, family to transport. D/c instructions and follow up appointments done and discussed with wife and daughter, verbalized understanding. PIV removed no s/s of swelling noted.

## 2016-06-06 ENCOUNTER — Other Ambulatory Visit: Payer: Self-pay | Admitting: *Deleted

## 2016-06-06 NOTE — Patient Outreach (Signed)
Pt in hospital 4/15-4/18/18 CHF exacerbation, telephone call to pt for transition of care week 1, phone busy, RN CM tried 3 times, unable to reach pt.    PLAN Attempt to reach pt tomorrow.  [name] [facility] Coordinator [phone]

## 2016-06-07 ENCOUNTER — Other Ambulatory Visit: Payer: Self-pay | Admitting: *Deleted

## 2016-06-07 ENCOUNTER — Telehealth: Payer: Self-pay | Admitting: Hematology

## 2016-06-07 ENCOUNTER — Ambulatory Visit (HOSPITAL_BASED_OUTPATIENT_CLINIC_OR_DEPARTMENT_OTHER): Payer: PPO | Admitting: Hematology

## 2016-06-07 ENCOUNTER — Encounter: Payer: Self-pay | Admitting: Hematology

## 2016-06-07 DIAGNOSIS — E119 Type 2 diabetes mellitus without complications: Secondary | ICD-10-CM

## 2016-06-07 DIAGNOSIS — R109 Unspecified abdominal pain: Secondary | ICD-10-CM

## 2016-06-07 DIAGNOSIS — R531 Weakness: Secondary | ICD-10-CM | POA: Diagnosis not present

## 2016-06-07 DIAGNOSIS — I10 Essential (primary) hypertension: Secondary | ICD-10-CM | POA: Diagnosis not present

## 2016-06-07 DIAGNOSIS — R110 Nausea: Secondary | ICD-10-CM

## 2016-06-07 DIAGNOSIS — C259 Malignant neoplasm of pancreas, unspecified: Secondary | ICD-10-CM | POA: Insufficient documentation

## 2016-06-07 DIAGNOSIS — I509 Heart failure, unspecified: Secondary | ICD-10-CM

## 2016-06-07 NOTE — Telephone Encounter (Signed)
No LOS per 06/07/16 visit.

## 2016-06-07 NOTE — Patient Outreach (Signed)
Pt hospitalized 4/15-4/18/18 for CHF exacerbation, telephone call to pt for transition of care week 1, spoke with wife who states she cannot hear on the phone and ask RN CM to speak with daughter Shondell Poulson, RN CM called daughter [name], HIPAA verified, [name] reports pt has discharge summary, all medications and taking as prescribed, is on 2 Liters fluid full liquid diet , 2000 mg sodium, pt has glucerna, is eating broth, creamy soups, etc. States " we got really good information from [place] about the full liquid diet and learned how to convert ounces and liters"  Weight today 139 pounds, daughter is calling and making appointment for primary care MD appointment post hospital follow up, pt saw oncologist today and per daughter " his heart is too weak for surgery so we are considering our options for the mass on his pancreas".    [facility] CM Care Plan Problem One     Most Recent Value  Care Plan Problem One  Knowledge deficit related to CHF, GI issues  Role Documenting the Problem One  Care Management Coordinator  Care Plan for Problem One  Active  THN Long Term Goal (31-90 days)  Pt will verbalize better understanding of disease processes (CHF, GI issues) to avoid hospital readmission within 60 days.  [facility]N Long Term Goal Start Date  05/21/16  Interventions for Problem One Long Term Goal  RN CM reviewed DC instructions with daughter, reviewed instructions and daily weights, BMET to be completed at MD office, daughter is calling today to make appointment with primary care MD  [facility] CM Short Term Goal #1 (0-30 days)  knowledge deficit related to CHF zones/ action plan  [facility] CM Short Term Goal #1 Start Date  05/21/16  Interventions for Short Term Goal #1  RN CM reinforced and reviewed CHF action plan with patient and wife, reviewed importance of daily weights and recording in [facility] calendar  [facility] CM Short Term Goal #2 (0-30 days)  pt will attend all upcoming GI related appointments, complete needed tests within 30  days.  [facility] CM Short Term Goal #2 Start Date  05/21/16  Interventions for Short Term Goal #2  RN CM reviewed importance of pt attending all upcoming tests, procedures and working closely with doctor related to GI issues    [facility] CM Care Plan Problem Two     Most Recent Value  Care Plan Problem Two  High risk for falls  Role Documenting the Problem Two  Care Management [place] for Problem Two  Active  [facility]N CM Short Term Goal #1 (0-30 days)  Pt and wife will verbalize safety precautions within 30 days  [facility] CM Short Term Goal #1 Start Date  05/31/16      PLAN Continue weekly transition of care calls  [name] [facility], BSN [place] Coordinator [phone]

## 2016-06-09 ENCOUNTER — Encounter: Payer: Self-pay | Admitting: Hematology

## 2016-06-11 DIAGNOSIS — I110 Hypertensive heart disease with heart failure: Secondary | ICD-10-CM | POA: Diagnosis not present

## 2016-06-11 DIAGNOSIS — R1012 Left upper quadrant pain: Secondary | ICD-10-CM | POA: Diagnosis not present

## 2016-06-11 DIAGNOSIS — Z7984 Long term (current) use of oral hypoglycemic drugs: Secondary | ICD-10-CM | POA: Diagnosis not present

## 2016-06-11 DIAGNOSIS — H409 Unspecified glaucoma: Secondary | ICD-10-CM | POA: Diagnosis not present

## 2016-06-11 DIAGNOSIS — Z886 Allergy status to analgesic agent status: Secondary | ICD-10-CM | POA: Diagnosis not present

## 2016-06-11 DIAGNOSIS — Z7902 Long term (current) use of antithrombotics/antiplatelets: Secondary | ICD-10-CM | POA: Diagnosis not present

## 2016-06-11 DIAGNOSIS — Z888 Allergy status to other drugs, medicaments and biological substances status: Secondary | ICD-10-CM | POA: Diagnosis not present

## 2016-06-11 DIAGNOSIS — K219 Gastro-esophageal reflux disease without esophagitis: Secondary | ICD-10-CM | POA: Diagnosis not present

## 2016-06-11 DIAGNOSIS — Z79899 Other long term (current) drug therapy: Secondary | ICD-10-CM | POA: Diagnosis not present

## 2016-06-11 DIAGNOSIS — R079 Chest pain, unspecified: Secondary | ICD-10-CM | POA: Diagnosis not present

## 2016-06-11 DIAGNOSIS — E871 Hypo-osmolality and hyponatremia: Secondary | ICD-10-CM | POA: Diagnosis not present

## 2016-06-11 DIAGNOSIS — I5022 Chronic systolic (congestive) heart failure: Secondary | ICD-10-CM | POA: Diagnosis not present

## 2016-06-11 DIAGNOSIS — C259 Malignant neoplasm of pancreas, unspecified: Secondary | ICD-10-CM | POA: Diagnosis not present

## 2016-06-11 DIAGNOSIS — I69354 Hemiplegia and hemiparesis following cerebral infarction affecting left non-dominant side: Secondary | ICD-10-CM | POA: Diagnosis not present

## 2016-06-11 DIAGNOSIS — I482 Chronic atrial fibrillation: Secondary | ICD-10-CM | POA: Diagnosis not present

## 2016-06-11 DIAGNOSIS — R197 Diarrhea, unspecified: Secondary | ICD-10-CM | POA: Diagnosis not present

## 2016-06-11 DIAGNOSIS — E119 Type 2 diabetes mellitus without complications: Secondary | ICD-10-CM | POA: Diagnosis not present

## 2016-06-11 DIAGNOSIS — I249 Acute ischemic heart disease, unspecified: Secondary | ICD-10-CM | POA: Diagnosis not present

## 2016-06-12 ENCOUNTER — Other Ambulatory Visit: Payer: Self-pay | Admitting: Licensed Clinical Social Worker

## 2016-06-12 ENCOUNTER — Other Ambulatory Visit: Payer: Self-pay | Admitting: *Deleted

## 2016-06-12 DIAGNOSIS — E871 Hypo-osmolality and hyponatremia: Secondary | ICD-10-CM | POA: Diagnosis not present

## 2016-06-12 NOTE — Patient Outreach (Signed)
Assessment:  CSW received communication on 06/12/16 regarding client status from [facility], Case Psychologist, prison and probation services. Keli informed CSW on 06/12/16 that client was now a patient at [place], case manager at [facility] , had called Marga Melnick to alert Keli to this patient information. CSW called Lurena Joiner, case manager at [facility], on 06/12/16. CSW verified identity of Lurena Joiner, Case Manager. Clarise Cruz and CSW spoke of client status and needs. Clarise Cruz said that client had admitted to that hospital for fatigue and low sodium symptoms. She said she anticipated that he may be at the hospital for a few days and then could hopefully discharge home with supports in place. CSW and Clarise Cruz spoke of client diagnosis of Pancreatic Adenocarcinoma. CSW informed Clarise Cruz that client and his family representatives had met on 06/07/16 with Dr. Krista Blue, oncologist at [facility] to discuss client treatment options.  Client and family were not interested in Hospice care for client at present. However, Dr. Krista Blue and client and family discussed possible Palliative radiation care for client.  Dr. Krista Blue planned to discuss client treatment options with colleagues on 06/12/16.  Clarise Cruz and CSW also spoke of fact that client had [employer] insurance. CSW informed Clarise Cruz that  [employer]  insurance does charge copay amounts to clients for in home support services through [employer]. CSW thanked Clarise Cruz for call with CSW on 06/12/16. . CSW gave Lurena Joiner, [place] phone number of [phone] and invited her to call CSW as needed to discuss status or discharge plans of client.  Plan:  CSW to call client or family representative as scheduled to assess client needs and status at that time.  [name].Kileigh Ortmann MSW, LCSW Licensed Clinical Social Worker [facility] Care Management [phone].

## 2016-06-12 NOTE — Patient Outreach (Signed)
RN CM called patient's daughter Benjamin Hurley for transition of care week 2, spoke with daughter, HIPAA verified, [name] states pt went to [facility] in [place] yesterday 06/11/16 for diarrhea, chest pain and diagnosed with low sodium.  [name] states pt is scheduled to be discharged in 1-2 days.  Case manager from [facility] hospital sent note informing pt at their facility.  [name] [facility] [place] Coordinator [phone]

## 2016-06-13 ENCOUNTER — Other Ambulatory Visit: Payer: Self-pay | Admitting: *Deleted

## 2016-06-13 ENCOUNTER — Ambulatory Visit: Payer: PPO | Admitting: Cardiovascular Disease

## 2016-06-13 DIAGNOSIS — E871 Hypo-osmolality and hyponatremia: Secondary | ICD-10-CM | POA: Diagnosis not present

## 2016-06-13 NOTE — Patient Outreach (Signed)
[facility]) Care Management  06/13/2016  [name] 1925/10/03 [id number]  Benjamin Hurley is an 83 year old gentleman, living in [place]. Medical history includes pancreatic cancer, diabetes mellitus, type 2 (insulin recently discontinued, benign essential hypertension, afib, and chronic systolic heart failure.   I reached out to [facility] today to follow up on Benjamin Hurley current status. He is still hospitalized there having been admitted for treatment of hyponatremia.   Plan: I will follow up on Benjamin Hurley progress tomorrow.    [place] Management  [phone]

## 2016-06-13 NOTE — Patient Outreach (Signed)
THN difficult case discussion, pt currently hospitalized at [facility] in [place] with hyponatremia, discussed plan of care for pancreatic cancer and end of life discussions (hospice, palliative, etc),  RN CM to see pt for home visit next week and [facility] CSW will join RN CM, pt, wife and daughter for discussion about goals of care.  [name] [facility], [place] Coordinator [phone]

## 2016-06-14 ENCOUNTER — Other Ambulatory Visit: Payer: Self-pay | Admitting: *Deleted

## 2016-06-14 DIAGNOSIS — R531 Weakness: Secondary | ICD-10-CM | POA: Diagnosis not present

## 2016-06-14 DIAGNOSIS — E871 Hypo-osmolality and hyponatremia: Secondary | ICD-10-CM | POA: Diagnosis not present

## 2016-06-14 NOTE — Patient Outreach (Signed)
[facility]) Care Management  06/14/2016  Benjamin Hurley Apr 06, 1925 [id number]  Benjamin Hurley is an 83 year old gentleman, living in [place]. Medical history includes pancreatic cancer, diabetes mellitus, type 2 (insulin recently discontinued), benign essential hypertension, afib, and chronic systolic heart failure.   Benjamin Hurley was being followed in the community by [facility] for nursing (transition of care) and social work services prior to his recent readmission to [facility]. I reached out to his daughter Benjamin Hurley at [phone] who was with Benjamin Hurley at the hospital awaiting his discharge paperwork then again this afternoon to ensure Benjamin Hurley safe arrival home.   [facility] Follow Up Appointments:  06/18/16 1:30pm Benjamin Hurley [facility], BSN / Benjamin Nan LCSW ([facility])   I called the [facility] @ [phone] to schedule a post hospital follow up appointment with Benjamin Hurley primary care provider, Dr. Cleophas Hurley. Curt Jews RN says she will watch for today's discharge summary and will schedule a post hospital appointment after Dr. Anda Hurley reviews the summary. I updated Benjamin Hurley daughter about these details. She told me that Benjamin Hurley is already scheduled to go to the office on Monday and will have labs drawn to follow up on his sodium and electrolytes.   Medication Review: When I called back to Benjamin Hurley daughter Benjamin Hurley, she had gone out to run errands for her mother and father so did not have all of Benjamin Hurley medications in front her and could not do a thorough med review. However she stated that Benjamin Hurley medications are unchanged with the exception of the deletion of Lasix. Benjamin Hurley is scheduled to be seen in the office on Monday for follow up as outlined above.   Nutrition Concerns/Volume Management  - Benjamin Hurley reports that Benjamin Hurley remains on a 2L/day restricted full liquid diet.  They were told today to be "a little more liberal" with Benjamin Hurley sodium restriction (previously 2000mg ) given his recent hyponatremia. As Benjamin Hurley has CHF, Benjamin Hurley reports that her mom weighs Benjamin Hurley every morning at exactly the same time with the exact amount of clothes on and records the weight. Benjamin Hurley and I reviewed guidelines about when to call the provider: weight gain of 3# overnight or 5# in a week (steady increase) OR swelling of feet/legs/ankles or abdomen. Benjamin Hurley said they were told to "pull back" on the sodium if Benjamin Hurley experiences swelling or weight gain.   Plan: I will update the primary care provider via faxed note of my conversation with Benjamin Hurley daughter today.   I will update Benjamin Hurley RNC, BSN of my conversation/assessment.   Benjamin Hurley RNC, BSN and Benjamin Nan LCSW will meet with Benjamin Hurley and his wife at the family home on 06/18/16.   Benjamin Hurley wife or daughter will call the provider number with new or worsening symptoms.   [facility] CM Care Plan Problem One     Most Recent Value  Care Plan Problem One  Knowledge deficit related to CHF, GI issues  Role Documenting the Problem One  Care Management Coordinator  Care Plan for Problem One  Active  THN Long Term Goal (31-90 days)  Pt will verbalize better understanding of disease processes (CHF, GI issues) to avoid hospital readmission within 60 days.  THN Long Term Goal Start Date  06/14/16  Interventions for Problem One Long Term Goal  RN CM reviewed DC instructions with daughter, reviewed instructions and daily weights,  BMET to be completed at MD office, daughter is calling today to make appointment with primary care MD  [facility] CM Short Term Goal #1 (0-30 days)  knowledge deficit related to CHF zones/ action plan  [facility] CM Short Term Goal #1 Start Date  06/14/16  Interventions for Short Term Goal #1  RN CM reinforced and reviewed CHF action plan with patient and wife, reviewed importance of daily weights and  recording in [facility] calendar  [facility]N CM Short Term Goal #2 (0-30 days)  pt will attend all upcoming GI related appointments, complete needed tests within 30 days.  [facility]N CM Short Term Goal #2 Start Date  06/14/16  Interventions for Short Term Goal #2  RNCM review of upcoming appointment schedule and planning    [facility] CM Care Plan Problem Two     Most Recent Value  Care Plan Problem Two  High risk for falls  Role Documenting the Problem Two  Care Management Coordinator  Care Plan for Problem Two  Active  THN CM Short Term Goal #1 (0-30 days)  Pt and wife will verbalize safety precautions within 30 days  [facility] CM Short Term Goal #1 Start Date  05/31/16     [facility] Care Management  [phone]

## 2016-06-17 ENCOUNTER — Other Ambulatory Visit: Payer: Self-pay | Admitting: Hematology

## 2016-06-17 DIAGNOSIS — C259 Malignant neoplasm of pancreas, unspecified: Secondary | ICD-10-CM

## 2016-06-18 ENCOUNTER — Other Ambulatory Visit: Payer: Self-pay | Admitting: *Deleted

## 2016-06-18 ENCOUNTER — Encounter: Payer: Self-pay | Admitting: *Deleted

## 2016-06-18 ENCOUNTER — Encounter: Payer: Self-pay | Admitting: Radiation Oncology

## 2016-06-18 ENCOUNTER — Other Ambulatory Visit: Payer: Self-pay | Admitting: Licensed Clinical Social Worker

## 2016-06-18 NOTE — Progress Notes (Signed)
GI Location of Tumor / Histology: Pancreatic cancer  Benjamin Hurley presented  months ago with symptoms of: abdominal pain left upper quadrant  Biopsies of (if applicable) revealed: Diagnosis 05/27/2016 FINE NEEDLE ASPIRATION, ENDOSCOPIC, LEFT UPPER QUADRANT MASS (SPECIMEN 1 OF 1 COLLECTED 05/27/16): MALIGNANT CELLS CONSISTENT WITH ADENOCARCINOMA  Past/Anticipated interventions by surgeon, if any: Dr. Rudene Anda 05/27/16 Upper EUS and biopsy  Colonoscopy with bx  Dr. Kennedy Bucker, MD  Diagnosis 05/13/16 1. Colon, polyp(s), transverse - TUBULAR ADENOMA(S). - HIGH GRADE DYSPLASIA IS NOT IDENTIFIED. 2. Colon, biopsy, mass - BENIGN POLYPOID COLORECTAL MUCOSA WITH A LYMPHOID AGGREGATE. - THERE IS NO EVIDENCE OF MALIGNANCY Transurethral resection of Prostate  Past/Anticipated interventions by medical oncology, if any: Dr. Burr Medico appt 06/07/16  Weight changes, if any:   Bowel/Bladder complaints, if any: constipation and loose stools off and on,   Nausea / Vomiting, if any: nausea  Pain issues, if any:  Abdominal pain 3 years,  Goes to the [place]  Any blood per rectum:     SAFETY ISSUES:  Left sided weakness, stroke, weakness, uses w/c,  Walks with walker with Physical therapist   Prior radiation? NO  Pacemaker/ICD? NO  Is the patient on methotrexate? NO  Current Complaints/Details:, Married, hx C-diff 3/218 and uncontrolled A-Fib, HX Stroke  Before back surgery and after.BPH,CHF,DM type 2,HTN,HC carotid endarectomy,  Mother Cancer  Allergies:Aspirin

## 2016-06-18 NOTE — Patient Outreach (Signed)
[facility]) Care Management   06/18/2016  Benjamin Hurley Feb 22, 1925 [id number]  TYREESE Hurley is an 83 y.o. male  Subjective: Routine home visit with pt, HIPAA verified, wife and daughter Benjamin Hurley present,  Daughter reports "we're waiting on authorization code from [place] for daddy's radiation and other treatment"  Daughter states she is going to call dietician at [facility] and discuss protein requirements for pt and states " we're writing everything down and trying to abide by sodium allotment and fluids 2 liters day"  Daughter reports stool specimen done at [facility] to check for C-diff as pt has had diarrhea recently although pt denies diarrhea today.  Pt had bloodwork done at primary MD at [facility] and sodium level is 129 and is still low, MD aware and wants pt to eat 2500mg  sodium daily and pt is off lasix temporarily.  Pt to see oncologist Dr. Krista Blue on Monday to discuss further treatment options such as radiation as pt not candidate for surgery or chemotherapy.  Objective:   . ROS Vitals:   06/18/16 1424  BP: 108/60  Pulse: 80  Resp: 18  SpO2: 98%  Weight: 139 lb (63 kg)  CBG today 130  Physical Exam  Constitutional: He is oriented to person, place, and time. He appears well-developed and well-nourished.  HENT:  Head: Normocephalic.  Neck: Normal range of motion. Neck supple.  Cardiovascular:  Irregular rhythm  Respiratory: Effort normal and breath sounds normal.  Dyspnea with exertion  GI: Soft. Bowel sounds are normal.  Musculoskeletal: Normal range of motion. He exhibits edema.  2+ edema lower extremities bil.  Neurological: He is alert and oriented to person, place, and time.  Skin: Skin is warm and dry.  Psychiatric: He has a normal mood and affect. His behavior is normal. Judgment and thought content normal.    Encounter Medications:   Outpatient Encounter Prescriptions as of 06/18/2016  Medication Sig Note  . acetaminophen (TYLENOL) 325 MG tablet Take 2 tablets  (650 mg total) by mouth every 6 (six) hours as needed for moderate pain.   Marland Kitchen apixaban (ELIQUIS) 5 MG TABS tablet Take 5 mg by mouth 2 (two) times daily. 05/27/2016: Last dose was Friday 05/24/16 brt, rn  . carboxymethylcellulose (REFRESH PLUS) 0.5 % SOLN Place 1 drop into both eyes 2 (two) times daily.   Marland Kitchen docusate sodium (COLACE) 100 MG capsule Take 1 capsule (100 mg total) by mouth 2 (two) times daily.   . feeding supplement, GLUCERNA SHAKE, (GLUCERNA SHAKE) LIQD Take 237 mLs by mouth daily.   . furosemide (LASIX) 40 MG tablet Take 1 tablet (40 mg total) by mouth daily.   Marland Kitchen gabapentin (NEURONTIN) 100 MG capsule Take 300 mg by mouth at bedtime.   Marland Kitchen glipiZIDE (GLUCOTROL) 5 MG tablet Take 1 tablet (5 mg total) by mouth 2 (two) times daily before a meal.   . latanoprost (XALATAN) 0.005 % ophthalmic solution Place 1 drop into both eyes at bedtime.   Marland Kitchen lisinopril (PRINIVIL,ZESTRIL) 5 MG tablet Take 1 tablet (5 mg total) by mouth daily.   . metoprolol (LOPRESSOR) 50 MG tablet Take 1 tablet (50 mg total) by mouth 2 (two) times daily.   . Multiple Vitamin (MULTIVITAMIN WITH MINERALS) TABS tablet Take 1 tablet by mouth daily.   . pantoprazole (PROTONIX) 40 MG tablet Take 40 mg by mouth daily.   . promethazine (PHENERGAN) 25 MG tablet Take 12.5 mg by mouth every 4 (four) hours as needed for nausea or vomiting.   Marland Kitchen  sucralfate (CARAFATE) 1 GM/10ML suspension Take 1 g by mouth 2 (two) times daily.    No facility-administered encounter medications on file as of 06/18/2016.     Functional Status:   In your present state of health, do you have any difficulty performing the following activities: 06/02/2016 05/30/2016  Hearing? Tempie Donning  Vision? N N  Difficulty concentrating or making decisions? N N  Walking or climbing stairs? Y Y  Dressing or bathing? N N  Doing errands, shopping? Tempie Donning  Preparing Food and eating ? - Y  Using the Toilet? - N  In the past six months, have you accidently leaked urine? - N  Do you have  problems with loss of bowel control? - N  Managing your Medications? - Y  Managing your Finances? - Y  Housekeeping or managing your Housekeeping? - Y  Some recent data might be hidden    Fall/Depression Screening:    Fall Risk  05/30/2016 05/21/2016  Falls in the past year? Yes Yes  Number falls in past yr: 1 1  Injury with Fall? No No  Risk for fall due to : Impaired balance/gait;Impaired mobility Impaired balance/gait;Impaired mobility  Follow up Falls evaluation completed Falls prevention discussed   PHQ 2/9 Scores 05/30/2016 05/21/2016  PHQ - 2 Score 0 0    Assessment:  Daughter Benjamin Hurley will talk with [facility] today about authorization code regarding pt receiving benefits and VA reimbursing for [employer] system.  RN CM talked with pt about advanced directives and pt has HCPOA but refuses at present to complete living will.  Daughter, pt and wife report they refused hospice and still refuse at present and will reevaluate after pt has radiation.  RN CM reviewed in depth diet teaching on adequate protein, 2500mg  sodium (daughter states this is what MD wants pt to have), counting fluids 2 liters daily and keeping log of all.  Daughter is going to try and make smoothies for pt and include greek yogurt, green vegetables, etc.  Joint visit with [place]: continue weekly transition of care call See pt for home visit in 3 weeks  [name] [facility], [place] Coordinator [phone]

## 2016-06-18 NOTE — Patient Outreach (Signed)
[facility]) Care Management  [facility] Social Work  06/18/2016  Benjamin Hurley 04/19/1925 [id number]  Subjective:    Objective:   Encounter Medications:  Outpatient Encounter Prescriptions as of 06/18/2016  Medication Sig Note  . acetaminophen (TYLENOL) 325 MG tablet Take 2 tablets (650 mg total) by mouth every 6 (six) hours as needed for moderate pain.   Marland Kitchen apixaban (ELIQUIS) 5 MG TABS tablet Take 5 mg by mouth 2 (two) times daily. 05/27/2016: Last dose was Friday 05/24/16 brt, rn  . carboxymethylcellulose (REFRESH PLUS) 0.5 % SOLN Place 1 drop into both eyes 2 (two) times daily.   Marland Kitchen docusate sodium (COLACE) 100 MG capsule Take 1 capsule (100 mg total) by mouth 2 (two) times daily.   . feeding supplement, GLUCERNA SHAKE, (GLUCERNA SHAKE) LIQD Take 237 mLs by mouth daily.   . furosemide (LASIX) 40 MG tablet Take 1 tablet (40 mg total) by mouth daily.   Marland Kitchen gabapentin (NEURONTIN) 100 MG capsule Take 300 mg by mouth at bedtime.   Marland Kitchen glipiZIDE (GLUCOTROL) 5 MG tablet Take 1 tablet (5 mg total) by mouth 2 (two) times daily before a meal.   . latanoprost (XALATAN) 0.005 % ophthalmic solution Place 1 drop into both eyes at bedtime.   Marland Kitchen lisinopril (PRINIVIL,ZESTRIL) 5 MG tablet Take 1 tablet (5 mg total) by mouth daily.   . metoprolol (LOPRESSOR) 50 MG tablet Take 1 tablet (50 mg total) by mouth 2 (two) times daily.   . Multiple Vitamin (MULTIVITAMIN WITH MINERALS) TABS tablet Take 1 tablet by mouth daily.   . pantoprazole (PROTONIX) 40 MG tablet Take 40 mg by mouth daily.   . promethazine (PHENERGAN) 25 MG tablet Take 12.5 mg by mouth every 4 (four) hours as needed for nausea or vomiting.   . sucralfate (CARAFATE) 1 GM/10ML suspension Take 1 g by mouth 2 (two) times daily.    No facility-administered encounter medications on file as of 06/18/2016.     Functional Status:  In your present state of health, do you have any difficulty performing the following activities: 06/02/2016 05/30/2016   Hearing? Tempie Donning  Vision? N N  Difficulty concentrating or making decisions? N N  Walking or climbing stairs? Y Y  Dressing or bathing? N N  Doing errands, shopping? Tempie Donning  Preparing Food and eating ? - Y  Using the Toilet? - N  In the past six months, have you accidently leaked urine? - N  Do you have problems with loss of bowel control? - N  Managing your Medications? - Y  Managing your Finances? - Y  Housekeeping or managing your Housekeeping? - Y  Some recent data might be hidden    Fall/Depression Screening:  PHQ 2/9 Scores 05/30/2016 05/21/2016  PHQ - 2 Score 0 0    Assessment:   CSW traveled to home of client on 06/18/16 to meet with client for a routine home visit. RN [name] also was present with client for routine home visit. Spouse of client was present for home visit. Client has prescribed medications and is taking medications as prescribed.  Benjamin Hurley was present for meeting. Benjamin Hurley, daughter of client, was present for home meeting.  Client's spouse said client had  received care from [place] in [place], [place] for over 20 years.  Client said he has dull pain in abdomen area. Client has been experiencing nausea for some time.  Client has been sleeping adequately. Client uses a Full Liquid diet.  Client has  family support. Benjamin Hurley, daughter of client, spoke with RN [name] about sodium lab value for client.  RN [name] spoke with client and family about client uses of Glucerna supplement. RN [name] spoke with client and family about client weight and diet for client.   CSW spoke with client and family about client care plan. CSW encouraged client to communicate with CSW in next 30 days to discuss community resources of assistance for client. CSW spoke with client and family about client's working with [facility] to allow VA to review client records of medical care.  Benjamin Hurley said VA was reviewing client's medical records at this time.  Family spoke of client recent  appointment with Benjamin Hurley, oncologist.  CSW spoke with client and family about Living Will. Client and spouse were not interested in Living Will completion for client. Client has [employer].  Benjamin Hurley said that radiation treatment recommended for client would be about 4 or 5 weeks in duration.  Client uses wheelchair as needed to assist client with ambulation. Client has fatigue, decreased energy. RN [name] said client had edema in ankles and top of feet. RN spoke with client and family about protein intake for client. CSW talked with client about quality of life issues for client. Client is not interested in Hospice support at present. Client is considering participating in radiation treatment for mass. Client was not recommended to have surgery or chemotherapy. He is thinking about radiation treatments for mass. Client wants to remain at home and to follow his normal routines.  Benjamin Hurley said she plans to communicate further with [facility] to discuss client medical records review.  CSW thanked client and family for meeting with CSW and RN at home of client on 06/18/16. CSW gave client [facility] CSW card with [facility] CSW number on card.  Client sees Dr. Anda Hurley at [facility] [place] for medical care.     Plan:   Client to communicate with CSW in next 30 days to discuss community resources of assistance for client.  CSW to collaborate with RN [name] in monitoring needs of client  CSW to call client or family representative in 3 weeks to assess client needs at that time.  [name].Emeric Novinger MSW, LCSW Licensed Clinical Social Worker [facility] Care Management [phone]

## 2016-06-19 ENCOUNTER — Telehealth: Payer: Self-pay | Admitting: *Deleted

## 2016-06-19 ENCOUNTER — Encounter: Payer: Self-pay | Admitting: Radiation Oncology

## 2016-06-19 NOTE — Telephone Encounter (Signed)
Daughter [name] called statsing, treatment plans to be faxed to [place], [place], asked if they could still see Dr. Lisbeth Renshaw Tuesday and if he would fax up a treatment plan to [place] to the [facility] clinic but that she does want her dad to get radaition here she was sorry for all the confusion, will let MD know , they plan to See MD Tuesday at 230pm for consult after all, will update their preferences then, will aslo forward this to Dr. Lewayne Bunting' office as well, not sure what they tryly want from [place] 4:51 PM

## 2016-06-19 NOTE — Telephone Encounter (Signed)
Benjamin Hurley, could you get pt's consent and fax his medical records over as they requested? Thanks.   Benjamin Merle MD

## 2016-06-19 NOTE — Telephone Encounter (Signed)
Received phone call voice message from [place] at the [place] clinic in [place],  Patient has decided to be treated there, requesting all notes, treatment plans to be faxed, she left her phone number [phone] with extension [phone], I returned call , we have not even seen the patient  As yet, and have deferred her to call Dr. Earlie Counts office,, Dr. Burr Medico last note 06/07/16,  with her request, I gave [phone]  As this is Dr. Ernestina Penna nurse phone number 4:08 PM f

## 2016-06-20 ENCOUNTER — Other Ambulatory Visit: Payer: Self-pay | Admitting: *Deleted

## 2016-06-20 ENCOUNTER — Ambulatory Visit: Admission: RE | Admit: 2016-06-20 | Payer: PPO | Source: Ambulatory Visit | Admitting: Radiation Oncology

## 2016-06-20 ENCOUNTER — Telehealth: Payer: Self-pay | Admitting: Hematology

## 2016-06-20 ENCOUNTER — Inpatient Hospital Stay (HOSPITAL_COMMUNITY)
Admission: EM | Admit: 2016-06-20 | Discharge: 2016-06-23 | DRG: 372 | Disposition: A | Discharge status: Home / Self Care | Payer: PPO | Attending: Internal Medicine | Admitting: Internal Medicine

## 2016-06-20 ENCOUNTER — Encounter (HOSPITAL_COMMUNITY): Payer: Self-pay | Admitting: *Deleted

## 2016-06-20 ENCOUNTER — Ambulatory Visit: Payer: PPO

## 2016-06-20 DIAGNOSIS — I6529 Occlusion and stenosis of unspecified carotid artery: Secondary | ICD-10-CM | POA: Diagnosis present

## 2016-06-20 DIAGNOSIS — Z79899 Other long term (current) drug therapy: Secondary | ICD-10-CM | POA: Diagnosis not present

## 2016-06-20 DIAGNOSIS — K56600 Partial intestinal obstruction, unspecified as to cause: Secondary | ICD-10-CM | POA: Diagnosis present

## 2016-06-20 DIAGNOSIS — E871 Hypo-osmolality and hyponatremia: Secondary | ICD-10-CM | POA: Diagnosis not present

## 2016-06-20 DIAGNOSIS — Z886 Allergy status to analgesic agent status: Secondary | ICD-10-CM | POA: Diagnosis not present

## 2016-06-20 DIAGNOSIS — Z87891 Personal history of nicotine dependence: Secondary | ICD-10-CM

## 2016-06-20 DIAGNOSIS — I69354 Hemiplegia and hemiparesis following cerebral infarction affecting left non-dominant side: Secondary | ICD-10-CM | POA: Diagnosis not present

## 2016-06-20 DIAGNOSIS — I959 Hypotension, unspecified: Secondary | ICD-10-CM | POA: Diagnosis present

## 2016-06-20 DIAGNOSIS — K219 Gastro-esophageal reflux disease without esophagitis: Secondary | ICD-10-CM | POA: Diagnosis present

## 2016-06-20 DIAGNOSIS — I5022 Chronic systolic (congestive) heart failure: Secondary | ICD-10-CM | POA: Diagnosis not present

## 2016-06-20 DIAGNOSIS — M4692 Unspecified inflammatory spondylopathy, cervical region: Secondary | ICD-10-CM | POA: Diagnosis not present

## 2016-06-20 DIAGNOSIS — G629 Polyneuropathy, unspecified: Secondary | ICD-10-CM | POA: Diagnosis not present

## 2016-06-20 DIAGNOSIS — Z794 Long term (current) use of insulin: Secondary | ICD-10-CM

## 2016-06-20 DIAGNOSIS — E1140 Type 2 diabetes mellitus with diabetic neuropathy, unspecified: Secondary | ICD-10-CM | POA: Diagnosis present

## 2016-06-20 DIAGNOSIS — I482 Chronic atrial fibrillation, unspecified: Secondary | ICD-10-CM

## 2016-06-20 DIAGNOSIS — I10 Essential (primary) hypertension: Secondary | ICD-10-CM | POA: Diagnosis present

## 2016-06-20 DIAGNOSIS — H409 Unspecified glaucoma: Secondary | ICD-10-CM | POA: Diagnosis present

## 2016-06-20 DIAGNOSIS — E440 Moderate protein-calorie malnutrition: Secondary | ICD-10-CM | POA: Diagnosis not present

## 2016-06-20 DIAGNOSIS — E861 Hypovolemia: Secondary | ICD-10-CM | POA: Diagnosis present

## 2016-06-20 DIAGNOSIS — E119 Type 2 diabetes mellitus without complications: Secondary | ICD-10-CM

## 2016-06-20 DIAGNOSIS — R0602 Shortness of breath: Secondary | ICD-10-CM

## 2016-06-20 DIAGNOSIS — E1159 Type 2 diabetes mellitus with other circulatory complications: Secondary | ICD-10-CM | POA: Diagnosis not present

## 2016-06-20 DIAGNOSIS — C259 Malignant neoplasm of pancreas, unspecified: Secondary | ICD-10-CM | POA: Diagnosis not present

## 2016-06-20 DIAGNOSIS — R51 Headache: Secondary | ICD-10-CM | POA: Diagnosis present

## 2016-06-20 DIAGNOSIS — Z7984 Long term (current) use of oral hypoglycemic drugs: Secondary | ICD-10-CM | POA: Diagnosis not present

## 2016-06-20 DIAGNOSIS — I110 Hypertensive heart disease with heart failure: Secondary | ICD-10-CM | POA: Diagnosis present

## 2016-06-20 DIAGNOSIS — K869 Disease of pancreas, unspecified: Secondary | ICD-10-CM | POA: Diagnosis not present

## 2016-06-20 DIAGNOSIS — E860 Dehydration: Secondary | ICD-10-CM | POA: Diagnosis present

## 2016-06-20 DIAGNOSIS — K8689 Other specified diseases of pancreas: Secondary | ICD-10-CM | POA: Diagnosis present

## 2016-06-20 DIAGNOSIS — A0472 Enterocolitis due to Clostridium difficile, not specified as recurrent: Secondary | ICD-10-CM | POA: Diagnosis not present

## 2016-06-20 DIAGNOSIS — Z7901 Long term (current) use of anticoagulants: Secondary | ICD-10-CM | POA: Diagnosis not present

## 2016-06-20 DIAGNOSIS — N400 Enlarged prostate without lower urinary tract symptoms: Secondary | ICD-10-CM | POA: Diagnosis not present

## 2016-06-20 LAB — CBC WITH DIFFERENTIAL/PLATELET
BASOS PCT: 0 %
Basophils Absolute: 0 10*3/uL (ref 0.0–0.1)
Eosinophils Absolute: 0.1 10*3/uL (ref 0.0–0.7)
Eosinophils Relative: 1 %
HCT: 36.1 % — ABNORMAL LOW (ref 39.0–52.0)
HEMOGLOBIN: 12.3 g/dL — AB (ref 13.0–17.0)
LYMPHS ABS: 2.5 10*3/uL (ref 0.7–4.0)
Lymphocytes Relative: 30 %
MCH: 28.9 pg (ref 26.0–34.0)
MCHC: 34.1 g/dL (ref 30.0–36.0)
MCV: 84.7 fL (ref 78.0–100.0)
MONO ABS: 0.9 10*3/uL (ref 0.1–1.0)
MONOS PCT: 11 %
NEUTROS PCT: 58 %
Neutro Abs: 4.8 10*3/uL (ref 1.7–7.7)
PLATELETS: 260 10*3/uL (ref 150–400)
RBC: 4.26 MIL/uL (ref 4.22–5.81)
RDW: 14 % (ref 11.5–15.5)
WBC: 8.2 10*3/uL (ref 4.0–10.5)

## 2016-06-20 LAB — COMPREHENSIVE METABOLIC PANEL
ALBUMIN: 3.6 g/dL (ref 3.5–5.0)
ALK PHOS: 73 U/L (ref 38–126)
ALT: 11 U/L — AB (ref 17–63)
AST: 24 U/L (ref 15–41)
Anion gap: 10 (ref 5–15)
BUN: 25 mg/dL — ABNORMAL HIGH (ref 6–20)
CALCIUM: 8.9 mg/dL (ref 8.9–10.3)
CO2: 20 mmol/L — AB (ref 22–32)
CREATININE: 1.05 mg/dL (ref 0.61–1.24)
Chloride: 96 mmol/L — ABNORMAL LOW (ref 101–111)
GFR calc non Af Amer: >60 mL/min (ref >60)
GLUCOSE: 101 mg/dL — AB (ref 65–99)
Potassium: 4.9 mmol/L (ref 3.5–5.1)
SODIUM: 126 mmol/L — AB (ref 135–145)
Total Bilirubin: 0.7 mg/dL (ref 0.3–1.2)
Total Protein: 6.8 g/dL (ref 6.5–8.1)

## 2016-06-20 LAB — I-STAT CG4 LACTIC ACID, ED: LACTIC ACID, VENOUS: 1.7 mmol/L (ref 0.5–1.9)

## 2016-06-20 MED ORDER — GLUCERNA SHAKE PO LIQD
237.0000 mL | Freq: Every day | ORAL | Status: DC
  Administered 2016-06-21: 237 mL via ORAL
  Filled 2016-06-20 (×3): qty 237

## 2016-06-20 MED ORDER — POLYVINYL ALCOHOL 1.4 % OP SOLN
1.0000 [drp] | Freq: Two times a day (BID) | OPHTHALMIC | Status: DC
  Administered 2016-06-21 – 2016-06-23 (×5): 1 [drp] via OPHTHALMIC
  Filled 2016-06-20: qty 15

## 2016-06-20 MED ORDER — ONDANSETRON HCL 4 MG/2ML IJ SOLN
4.0000 mg | Freq: Four times a day (QID) | INTRAMUSCULAR | Status: DC | PRN
  Administered 2016-06-22: 4 mg via INTRAVENOUS
  Filled 2016-06-20 (×2): qty 2

## 2016-06-20 MED ORDER — SODIUM CHLORIDE 0.9% FLUSH
3.0000 mL | Freq: Two times a day (BID) | INTRAVENOUS | Status: DC
  Administered 2016-06-21 – 2016-06-22 (×2): 3 mL via INTRAVENOUS

## 2016-06-20 MED ORDER — SODIUM CHLORIDE 0.9 % IV SOLN
INTRAVENOUS | Status: AC
  Administered 2016-06-21: 02:00:00 via INTRAVENOUS

## 2016-06-20 MED ORDER — FIDAXOMICIN 200 MG PO TABS
200.0000 mg | ORAL_TABLET | Freq: Two times a day (BID) | ORAL | Status: DC
  Administered 2016-06-20 – 2016-06-21 (×2): 200 mg via ORAL
  Filled 2016-06-20 (×2): qty 1

## 2016-06-20 MED ORDER — SUCRALFATE 1 GM/10ML PO SUSP
1.0000 g | Freq: Two times a day (BID) | ORAL | Status: DC
  Administered 2016-06-21 – 2016-06-23 (×6): 1 g via ORAL
  Filled 2016-06-20 (×6): qty 10

## 2016-06-20 MED ORDER — ACETAMINOPHEN 325 MG PO TABS
650.0000 mg | ORAL_TABLET | Freq: Four times a day (QID) | ORAL | Status: DC | PRN
  Administered 2016-06-21 – 2016-06-23 (×5): 650 mg via ORAL
  Filled 2016-06-20 (×4): qty 2

## 2016-06-20 MED ORDER — APIXABAN 5 MG PO TABS
5.0000 mg | ORAL_TABLET | Freq: Two times a day (BID) | ORAL | Status: DC
  Administered 2016-06-21 – 2016-06-23 (×6): 5 mg via ORAL
  Filled 2016-06-20 (×6): qty 1

## 2016-06-20 MED ORDER — GABAPENTIN 300 MG PO CAPS
300.0000 mg | ORAL_CAPSULE | Freq: Every day | ORAL | Status: DC
  Administered 2016-06-21 – 2016-06-22 (×3): 300 mg via ORAL
  Filled 2016-06-20 (×3): qty 1

## 2016-06-20 MED ORDER — ONDANSETRON HCL 4 MG PO TABS
4.0000 mg | ORAL_TABLET | Freq: Four times a day (QID) | ORAL | Status: DC | PRN
Start: 2016-06-20 — End: 2016-06-23

## 2016-06-20 MED ORDER — ACETAMINOPHEN 650 MG RE SUPP: 650.0000 mg | Freq: Four times a day (QID) | RECTAL | Status: DC | PRN

## 2016-06-20 MED ORDER — METOPROLOL TARTRATE 25 MG PO TABS
25.0000 mg | ORAL_TABLET | Freq: Two times a day (BID) | ORAL | Status: DC
  Administered 2016-06-21 – 2016-06-23 (×6): 25 mg via ORAL
  Filled 2016-06-20 (×6): qty 1

## 2016-06-20 MED ORDER — INSULIN ASPART 100 UNIT/ML ~~LOC~~ SOLN
0.0000 [IU] | Freq: Three times a day (TID) | SUBCUTANEOUS | Status: DC
  Administered 2016-06-21 (×2): 1 [IU] via SUBCUTANEOUS
  Administered 2016-06-22: 2 [IU] via SUBCUTANEOUS
  Administered 2016-06-22: 1 [IU] via SUBCUTANEOUS
  Administered 2016-06-23: 2 [IU] via SUBCUTANEOUS

## 2016-06-20 MED ORDER — SODIUM CHLORIDE 0.9 % IV BOLUS (SEPSIS)
1000.0000 mL | Freq: Once | INTRAVENOUS | Status: AC
  Administered 2016-06-20: 1000 mL via INTRAVENOUS

## 2016-06-20 MED ORDER — LATANOPROST 0.005 % OP SOLN
1.0000 [drp] | Freq: Every day | OPHTHALMIC | Status: DC
  Administered 2016-06-21 – 2016-06-22 (×3): 1 [drp] via OPHTHALMIC
  Filled 2016-06-20: qty 2.5

## 2016-06-20 MED ORDER — PANTOPRAZOLE SODIUM 40 MG PO TBEC
40.0000 mg | DELAYED_RELEASE_TABLET | Freq: Every day | ORAL | Status: DC
  Administered 2016-06-21 – 2016-06-23 (×3): 40 mg via ORAL
  Filled 2016-06-20 (×3): qty 1

## 2016-06-20 MED ORDER — ONDANSETRON HCL 4 MG/2ML IJ SOLN
4.0000 mg | Freq: Once | INTRAMUSCULAR | Status: AC
  Administered 2016-06-20: 4 mg via INTRAVENOUS
  Filled 2016-06-20: qty 2

## 2016-06-20 NOTE — ED Triage Notes (Signed)
Pt daughter states the pt has had diarrhea x 2 days, was told yesterday he had c. Diff. Pt has not had medication yet for c. Diff. Pt has hx of pancreatic cancer, has not received chemo or radiation.

## 2016-06-20 NOTE — ED Provider Notes (Signed)
[place] DEPT Provider Note   CSN: [id number] Arrival date & time: 06/20/16  1903     History   Chief Complaint Chief Complaint  Patient presents with  . Diarrhea  . Nausea    HPI Benjamin Hurley is a 83 y.o. male.  83yo M w/ PMH including A fib on Eliquis, CHF, CVA, pancreatic cancer who p/w diarrhea. The patient has a history of C. difficile infection a few years ago and again in March of this year. Since March he has had intermittent bouts with diarrhea. His diarrhea got worse over the past few days and yesterday he tested positive for C. difficile at PCP office in [place] clinic. He has had at least 4-5 episodes of diarrhea today. He was prescribed medication yesterday but has not yet started it. Family is concerned that he is dehydrated because he has intermittent vomiting which has been ongoing chronically. No fevers, abdominal pain, CP, or SOB. No recent abx use.   The history is provided by the patient, the spouse and a relative.  Diarrhea      Past Medical History:  Diagnosis Date  . A-fib ([place])    on Eliquis  . Arthritis    neck  . BPH (benign prostatic hyperplasia)   . Cancer [facility])    pancreatic cancer  . Carotid stenosis   . CHF (congestive heart failure) ([place])   . Diabetes ([place])    type 2  . Essential hypertension   . GERD (gastroesophageal reflux disease)   . Glaucoma    both eyes  . Headache   . Peripheral neuropathy   . Stroke [facility]) 2014   left side weakness    Patient Active Problem List   Diagnosis Date Noted  . Pancreatic adenocarcinoma ([place]) 06/07/2016  . SOB (shortness of breath)   . Benign essential HTN   . Acute exacerbation of CHF (congestive heart failure) ([place]) 06/02/2016  . Pancreatic mass   . Abdominal mass, left upper quadrant   . Malnutrition of moderate degree 05/15/2016  . Chronic systolic CHF (congestive heart failure) ([place]) 05/14/2016  . Abnormal CT scan, colon   . LUQ abdominal pain   . Benign neoplasm of transverse  colon   . Stricture of colon ([place])   . Atrial fibrillation with RVR ([place]) 05/10/2016  . Hypomagnesemia 05/10/2016  . Diabetes mellitus, type 2 ([place]) 05/10/2016  . Encounter for palliative care   . Goals of care, counseling/discussion   . Nausea without vomiting     Past Surgical History:  Procedure Laterality Date  . CAROTID ENDARTERECTOMY Bilateral   . COLONOSCOPY WITH PROPOFOL N/A 05/13/2016   Procedure: COLONOSCOPY WITH PROPOFOL;  Surgeon: Ladene Artist, MD;  [facility];  Service: Endoscopy;  Laterality: N/A;  . EUS N/A 05/27/2016   Procedure: UPPER ENDOSCOPIC ULTRASOUND (EUS) LINEAR;  Surgeon: Milus Banister, MD;  [facility];  Service: Endoscopy;  Laterality: N/A;  . lumber spine    . TRANSURETHRAL RESECTION OF PROSTATE         Home Medications    Prior to Admission medications   Medication Sig Start Date End Date Taking? Authorizing Provider  acetaminophen (TYLENOL) 325 MG tablet Take 2 tablets (650 mg total) by mouth every 6 (six) hours as needed for moderate pain. 05/17/16   Mauricio Gerome Apley, MD  apixaban (ELIQUIS) 5 MG TABS tablet Take 5 mg by mouth 2 (two) times daily.    Historical Provider, MD  carboxymethylcellulose (REFRESH PLUS) 0.5 % SOLN  Place 1 drop into both eyes 2 (two) times daily.    Historical Provider, MD  docusate sodium (COLACE) 100 MG capsule Take 1 capsule (100 mg total) by mouth 2 (two) times daily. 05/17/16   Mauricio Gerome Apley, MD  feeding supplement, GLUCERNA SHAKE, (GLUCERNA SHAKE) LIQD Take 237 mLs by mouth daily.    Historical Provider, MD  furosemide (LASIX) 40 MG tablet Take 1 tablet (40 mg total) by mouth daily. 06/05/16   Barton Dubois, MD  gabapentin (NEURONTIN) 100 MG capsule Take 300 mg by mouth at bedtime. 03/13/16   Historical Provider, MD  glipiZIDE (GLUCOTROL) 5 MG tablet Take 1 tablet (5 mg total) by mouth 2 (two) times daily before a meal. 06/05/16   Barton Dubois, MD  latanoprost (XALATAN) 0.005 %  ophthalmic solution Place 1 drop into both eyes at bedtime.    Historical Provider, MD  lisinopril (PRINIVIL,ZESTRIL) 5 MG tablet Take 1 tablet (5 mg total) by mouth daily. 06/06/16   Barton Dubois, MD  metoprolol (LOPRESSOR) 50 MG tablet Take 1 tablet (50 mg total) by mouth 2 (two) times daily. 05/17/16   Mauricio Gerome Apley, MD  Multiple Vitamin (MULTIVITAMIN WITH MINERALS) TABS tablet Take 1 tablet by mouth daily. 05/17/16   Mauricio Gerome Apley, MD  pantoprazole (PROTONIX) 40 MG tablet Take 40 mg by mouth daily.    Historical Provider, MD  promethazine (PHENERGAN) 25 MG tablet Take 12.5 mg by mouth every 4 (four) hours as needed for nausea or vomiting.    Historical Provider, MD  sucralfate (CARAFATE) 1 GM/10ML suspension Take 1 g by mouth 2 (two) times daily.    Historical Provider, MD    Family History Family History  Problem Relation Age of Onset  . Cancer Mother     cancer     Social History Social History  Substance Use Topics  . Smoking status: Former Smoker    Years: 5.00    Types: Pipe    Quit date: 1968  . Smokeless tobacco: Former Systems developer    Types: Chew    Quit date: 1968  . Alcohol use No     Allergies   Aspirin   Review of Systems Review of Systems  Gastrointestinal: Positive for diarrhea.   All other systems reviewed and are negative except that which was mentioned in HPI  Physical Exam Updated Vital Signs BP 92/73 (BP Location: Left Arm)   Pulse (!) 108   Temp 98.6 F (37 C) (Oral)   Resp 20   SpO2 99%   Physical Exam  Constitutional: He is oriented to person, place, and time. He appears well-developed. No distress.  Frail elderly man awake and alert  HENT:  Head: Normocephalic and atraumatic.  dry mucous membranes  Eyes: Conjunctivae are normal. Pupils are equal, round, and reactive to light.  Neck: Neck supple.  Cardiovascular: Normal rate, regular rhythm and normal heart sounds.   No murmur heard. Pulmonary/Chest: Effort normal and breath  sounds normal.  Abdominal: Soft. Bowel sounds are normal. He exhibits no distension. There is no tenderness.  Musculoskeletal: He exhibits no edema.  Neurological: He is alert and oriented to person, place, and time.  Fluent speech  Skin: Skin is warm and dry.  Psychiatric: He has a normal mood and affect. Judgment normal.  Nursing note and vitals reviewed.    ED Treatments / Results  Labs (all labs ordered are listed, but only abnormal results are displayed) Labs Reviewed  COMPREHENSIVE METABOLIC PANEL - Abnormal; Notable for the  following:       Result Value   Sodium 126 (*)    Chloride 96 (*)    CO2 20 (*)    Glucose, Bld 101 (*)    BUN 25 (*)    ALT 11 (*)    All other components within normal limits  CBC WITH DIFFERENTIAL/PLATELET - Abnormal; Notable for the following:    Hemoglobin 12.3 (*)    HCT 36.1 (*)    All other components within normal limits  C DIFFICILE QUICK SCREEN W PCR REFLEX  URINALYSIS, ROUTINE W REFLEX MICROSCOPIC  CBC  BASIC METABOLIC PANEL  I-STAT CG4 LACTIC ACID, ED    EKG  EKG Interpretation None       Radiology No results found.  Procedures Procedures (including critical care time)  Medications Ordered in ED Medications  fidaxomicin (DIFICID) tablet 200 mg (not administered)  ondansetron (ZOFRAN) injection 4 mg (not administered)  sodium chloride 0.9 % bolus 1,000 mL (1,000 mLs Intravenous New Bag/Given 06/20/16 2023)     Initial Impression / Assessment and Plan / ED Course  I have reviewed the triage vital signs and the nursing notes.  Pertinent labs  that were available during my care of the patient were reviewed by me and considered in my medical decision making (see chart for details).     Pt w/ h/o recurrent C diff infection, most recently in march, with several days of worsening diarrhea and concerns for dehydration due to poor oral intake. He was frail appearing on exam but comfortable and in no acute distress. Vital  signs notable for heart rate 108, BP 92/73. He appeared dry but had no abdominal tenderness. His lab work here shows normal lactate, sodium 126 which is worse than sodium 129 earlier this week, BUN 25, normal WBC. I am concerned about the patient's worsening hyponatremia and evidence of dehydration given his ongoing losses and poor oral intake because of his vomiting which is likely related to pancreatic cancer. I have given him a dose of dificid in addition to fluids but feel that he needs inpatient treatment of C diff until clinical improvement and ability to keep up with fluid losses. Discussed admission with hospitalist, Dr. Eulas Post, and patient admitted for further care.  Final Clinical Impressions(s) / ED Diagnoses   Final diagnoses:  None    New Prescriptions New Prescriptions   No medications on file     Sharlett Iles, MD 06/21/16 [phone]

## 2016-06-20 NOTE — H&P (Signed)
History and Physical    Benjamin Hurley BMW:[id number] DOB: [birth year] DOA: 06/20/2016  PCP: Benjamin Dunker, MD   Patient coming from: Home  Chief Complaint: Diarrhea, decreased PO intake, generalized weakness  HPI: Benjamin Hurley is a 83 y.o. gentleman with a history of chronic systolic heart failure (EF 30-35%), Type 2 DM with neuropathy, HTN, BPH, chronic atrial fibrillation (CHADS-Vasc score of 7, anticoagulated with Eliquis), and recent diagnosis of pancreatic cancer (family still hoping for treatment; due to seen in oncology clinic at [facility] next week) who has had multiple admissions since March (C diff, abdominal pain, hyponatremia, CHF).  He was last admitted to our system 4/15-4/18 for CHF exacerbation.  He was admitted to [facility] just last week for hyponatremia/dehydration.  He has had poor PO intake since his diagnosis of pancreatic cancer.  He is on a full liquid diet at home.  He developed loose stools prior to being discharged from [facility] [place] last week.  The family took him to his PCP at the [place] in [place] on Monday for evaluation.  Stool studies were sent based on his prior history of C diff colitis, and the family was notified yesterday that the stool sample was positive (again) for active C diff infection.  [facility]  He has continued to have diarrhea.  His family is worried about dehydration and hyponatremia, so they brought him to the ED tonight to start treatment.  The patient has generalized weakness.  No falls.  No LOC.  No headache.  No chest pain or shortness of breath.  He has bilateral ankle and foot edema.  Intermittent nausea and vomiting.  Abdominal pain has not been any worse.  No gross bleeding.  No fever.   ED Course: Normal WBC count.  Hgb 12.  Sodium 126 (reportedly 130 upon discharge from [place] last week).  Bicarb 20.  Lactic acid level 1.7.  BUN 25.  Normal creatinine.  DIFICID ordered in the ED.  1L NS bolus given in the ED.  Hospitalist  asked to admit.  Review of Systems: As per HPI otherwise 10 systems reviewed and negative.   Past Medical History:  Diagnosis Date  . A-fib ([place])    on Eliquis  . Arthritis    neck  . BPH (benign prostatic hyperplasia)   . Cancer [facility])    pancreatic cancer  . Carotid stenosis   . CHF (congestive heart failure) ([place])   . Diabetes ([place])    type 2  . Essential hypertension   . GERD (gastroesophageal reflux disease)   . Glaucoma    both eyes  . Headache   . Peripheral neuropathy   . Stroke [facility]) 2014   left side weakness    Past Surgical History:  Procedure Laterality Date  . CAROTID ENDARTERECTOMY Bilateral   . COLONOSCOPY WITH PROPOFOL N/A 05/13/2016   Procedure: COLONOSCOPY WITH PROPOFOL;  Surgeon: Ladene Artist, MD;  [facility];  Service: Endoscopy;  Laterality: N/A;  . EUS N/A 05/27/2016   Procedure: UPPER ENDOSCOPIC ULTRASOUND (EUS) LINEAR;  Surgeon: Milus Banister, MD;  [facility];  Service: Endoscopy;  Laterality: N/A;  . lumber spine    . TRANSURETHRAL RESECTION OF PROSTATE       reports that he quit smoking about 50 years ago. His smoking use included Pipe. He quit after 5.00 years of use. He quit smokeless tobacco use about 50 years ago. His smokeless tobacco use included Chew. He reports that he does  not drink alcohol or use drugs.  He is married.  Allergies  Allergen Reactions  . Aspirin Nausea Only and Other (See Comments)    Makes pt very sick    Family History  Problem Relation Age of Onset  . Cancer Mother     cancer      Prior to Admission medications   Medication Sig Start Date End Date Taking? Authorizing Provider  acetaminophen (TYLENOL) 325 MG tablet Take 2 tablets (650 mg total) by mouth every 6 (six) hours as needed for moderate pain. 05/17/16  Yes Mauricio Gerome Apley, MD  apixaban (ELIQUIS) 5 MG TABS tablet Take 5 mg by mouth 2 (two) times daily.   Yes Historical Provider, MD  carboxymethylcellulose (REFRESH  PLUS) 0.5 % SOLN Place 1 drop into both eyes 2 (two) times daily.   Yes Historical Provider, MD  feeding supplement, GLUCERNA SHAKE, (GLUCERNA SHAKE) LIQD Take 237 mLs by mouth daily.   Yes Historical Provider, MD  gabapentin (NEURONTIN) 100 MG capsule Take 300 mg by mouth at bedtime. 03/13/16  Yes Historical Provider, MD  glipiZIDE (GLUCOTROL) 5 MG tablet Take 1 tablet (5 mg total) by mouth 2 (two) times daily before a meal. 06/05/16  Yes Barton Dubois, MD  latanoprost (XALATAN) 0.005 % ophthalmic solution Place 1 drop into both eyes at bedtime.   Yes Historical Provider, MD  lisinopril (PRINIVIL,ZESTRIL) 5 MG tablet Take 1 tablet (5 mg total) by mouth daily. 06/06/16  Yes Barton Dubois, MD  metoprolol (LOPRESSOR) 50 MG tablet Take 1 tablet (50 mg total) by mouth 2 (two) times daily. 05/17/16  Yes Mauricio Gerome Apley, MD  Multiple Vitamin (MULTIVITAMIN WITH MINERALS) TABS tablet Take 1 tablet by mouth daily. 05/17/16  Yes Mauricio Gerome Apley, MD  pantoprazole (PROTONIX) 40 MG tablet Take 40 mg by mouth daily.   Yes Historical Provider, MD  promethazine (PHENERGAN) 25 MG tablet Take 12.5 mg by mouth every 4 (four) hours as needed for nausea or vomiting.   Yes Historical Provider, MD  simethicone (MYLICON) 80 MG chewable tablet Chew 80 mg by mouth every 6 (six) hours as needed for flatulence.   Yes Historical Provider, MD  sucralfate (CARAFATE) 1 GM/10ML suspension Take 1 g by mouth 2 (two) times daily.   Yes Historical Provider, MD    Physical Exam: Vitals:   06/20/16 1911  BP: 92/73  Pulse: (!) 108  Resp: 20  Temp: 98.6 F (37 C)  TempSrc: Oral  SpO2: 99%      Constitutional: NAD, calm, Chronically ill appearing, he is not acutely decompensating. Vitals:   06/20/16 1911  BP: 92/73  Pulse: (!) 108  Resp: 20  Temp: 98.6 F (37 C)  TempSrc: Oral  SpO2: 99%   Eyes: PERRL, lids and conjunctivae normal ENMT: Mucous membranes are dry. Posterior pharynx not completely visualized.   Normal dentition.  Neck: normal appearance, supple, no masses Respiratory: clear to auscultation listening anteriorly.  Normal respiratory effort. No accessory muscle use.  Cardiovascular: Mildly tachycardic and irregular.  No murmurs / rubs / gallops. 1+ pitting edema in bilateral lower extremities.  2+ distal pulses.    GI: abdomen is soft and compressible.  No distention.  No tenderness.  Bowel sounds are hyperactive. Musculoskeletal:  No joint deformity in upper and lower extremities. No contractures. Normal muscle tone.  Skin: no rashes, pale, cool, dry Neurologic: No focal deficits.  Moves all four extremities spontaneously. Psychiatric: Oriented to person.  Flat affect.  Judgment impaired.  Labs on Admission: I have personally reviewed following labs and imaging studies  CBC:  Recent Labs Lab 06/20/16 2000  WBC 8.2  NEUTROABS 4.8  HGB 12.3*  HCT 36.1*  MCV 84.7  PLT 741   Basic Metabolic Panel:  Recent Labs Lab 06/20/16 2000  NA 126*  K 4.9  CL 96*  CO2 20*  GLUCOSE 101*  BUN 25*  CREATININE 1.05  CALCIUM 8.9   GFR: Estimated Creatinine Clearance: 43.4 mL/min (by C-G formula based on SCr of 1.05 mg/dL). Liver Function Tests:  Recent Labs Lab 06/20/16 2000  AST 24  ALT 11*  ALKPHOS 73  BILITOT 0.7  PROT 6.8  ALBUMIN 3.6   Urine analysis: Pending  Sepsis Labs:  Lactic acid level 1.7.   Assessment/Plan Principal Problem:   C. difficile diarrhea Active Problems:   Diabetes mellitus, type 2 (HCC)   Chronic systolic CHF (congestive heart failure) (HCC)   Malnutrition of moderate degree   Pancreatic mass   Benign essential HTN   Atrial fibrillation, chronic (HCC)      C diff diarrhea without overt sepsis though blood pressures are borderline low --Continue DIFICID for now --Awaiting our C diff PCR on stool sample --May need to discuss with ID  --Cautious hydration with history of CHF --FULL liquid diet as tolerated --Imaging deferred  at this time; abdominal exam is benign  Hyponatremia, likely hypovolemic given diarrhea, decreased PO intake --Cautious fluids --Repeat BMP in the AM  Chronic atrial fibrillation --Will try to give half of home dose of metoprolol due to relative hypotension --Telemetry monitoring --Eliquis  HTN --Holding lisinopril due to relative hypotension  Chronic systolic heart failure --At risk for decompensation --Monitor for oxygen requirement --Strict I/O --Daily weights --I did not order fluid restriction because PO intake is already poor  DM --Hold glipizide --SSI TID AC  Neuropathy --Neurontin  Glaucoma --Eyedrops  GERD --PPI, carafate  Recent diagnosis of pancreatic cancer --Oncology follow-up next week      DVT prophylaxis: Anticoagulated with Eliquis Code Status: FULL Family Communication: Wife and daughter present at bedside in the ED at time of admission. Disposition Plan: Expect he will go home at discharge. Consults called: NONE Admission status: Place in observation, telemetry monitoring.   TIME SPENT: 50 minutes   Eber Jones MD [facility] Pager [phone]  If 7PM-7AM, please contact night-coverage [email or web address] Password [email or web address]  06/20/2016, 11:42 PM

## 2016-06-20 NOTE — ED Notes (Signed)
Pt. Made aware for the need of urine, unable to give specimen at this time.

## 2016-06-20 NOTE — Patient Outreach (Signed)
Pt case discussed at difficult case discussion, follow up needed on issue that daughter states [employer] will not pay for radiation treatments at [facility]- Dr. Kyung Rudd is radiation oncologist-spoke with daughter today and she states " they have [facility] listed first as primary because that's what mama and daddy told them"   Also reports "he's got C-Diff and is getting the medicine for that" RN CM called [place] and due to high call volume had to leave voicemail, RN CM requested return call about insurance issues.  [name] [facility], [place] Coordinator [phone]

## 2016-06-20 NOTE — ED Notes (Signed)
Bed: WA24 Expected date:  Expected time:  Means of arrival:  Comments: Triage 1 

## 2016-06-20 NOTE — Telephone Encounter (Signed)
Faxed records to [place] @ the [facility]

## 2016-06-21 ENCOUNTER — Other Ambulatory Visit: Payer: Self-pay | Admitting: *Deleted

## 2016-06-21 DIAGNOSIS — A0472 Enterocolitis due to Clostridium difficile, not specified as recurrent: Secondary | ICD-10-CM | POA: Diagnosis not present

## 2016-06-21 LAB — URINALYSIS, ROUTINE W REFLEX MICROSCOPIC
BILIRUBIN URINE: NEGATIVE
GLUCOSE, UA: NEGATIVE mg/dL
HGB URINE DIPSTICK: NEGATIVE
KETONES UR: NEGATIVE mg/dL
LEUKOCYTES UA: NEGATIVE
NITRITE: NEGATIVE
PROTEIN: NEGATIVE mg/dL
RBC / HPF: NONE SEEN RBC/hpf (ref 0–5)
Specific Gravity, Urine: 1.006 (ref 1.005–1.030)
pH: 5 (ref 5.0–8.0)

## 2016-06-21 LAB — GLUCOSE, CAPILLARY
GLUCOSE-CAPILLARY: 68 mg/dL (ref 65–99)
GLUCOSE-CAPILLARY: 149 mg/dL — AB (ref 65–99)
GLUCOSE-CAPILLARY: 147 mg/dL — AB (ref 65–99)
GLUCOSE-CAPILLARY: 155 mg/dL — AB (ref 65–99)
Glucose-Capillary: 86 mg/dL (ref 65–99)

## 2016-06-21 LAB — SODIUM, URINE, RANDOM: SODIUM UR: 42 mmol/L

## 2016-06-21 LAB — BASIC METABOLIC PANEL
ANION GAP: 9 (ref 5–15)
BUN: 22 mg/dL — AB (ref 6–20)
CHLORIDE: 100 mmol/L — AB (ref 101–111)
CO2: 21 mmol/L — ABNORMAL LOW (ref 22–32)
Calcium: 8.9 mg/dL (ref 8.9–10.3)
Creatinine, Ser: 0.98 mg/dL (ref 0.61–1.24)
GFR calc Af Amer: >60 mL/min (ref >60)
Glucose, Bld: 85 mg/dL (ref 65–99)
POTASSIUM: 4.3 mmol/L (ref 3.5–5.1)
SODIUM: 130 mmol/L — AB (ref 135–145)

## 2016-06-21 LAB — CBC
HCT: 35.6 % — ABNORMAL LOW (ref 39.0–52.0)
HEMOGLOBIN: 11.8 g/dL — AB (ref 13.0–17.0)
MCH: 28.6 pg (ref 26.0–34.0)
MCHC: 33.1 g/dL (ref 30.0–36.0)
MCV: 86.2 fL (ref 78.0–100.0)
Platelets: 228 10*3/uL (ref 150–400)
RBC: 4.13 MIL/uL — AB (ref 4.22–5.81)
RDW: 14.3 % (ref 11.5–15.5)
WBC: 7.5 10*3/uL (ref 4.0–10.5)

## 2016-06-21 LAB — OSMOLALITY: Osmolality: 280 mOsm/kg (ref 275–295)

## 2016-06-21 LAB — OSMOLALITY, URINE: OSMOLALITY UR: 247 mosm/kg — AB (ref 300–900)

## 2016-06-21 MED ORDER — BOOST / RESOURCE BREEZE PO LIQD
1.0000 | Freq: Three times a day (TID) | ORAL | Status: DC
Start: 2016-06-21 — End: 2016-06-23
  Administered 2016-06-21 – 2016-06-22 (×3): 1 via ORAL

## 2016-06-21 MED ORDER — VANCOMYCIN 50 MG/ML ORAL SOLUTION: 125.0000 mg | Freq: Two times a day (BID) | ORAL | Status: DC

## 2016-06-21 MED ORDER — ENSURE ENLIVE PO LIQD
237.0000 mL | Freq: Two times a day (BID) | ORAL | Status: DC
  Administered 2016-06-21 – 2016-06-22 (×2): 237 mL via ORAL

## 2016-06-21 MED ORDER — VANCOMYCIN 50 MG/ML ORAL SOLUTION: 125.0000 mg | ORAL | Status: DC

## 2016-06-21 MED ORDER — VANCOMYCIN 50 MG/ML ORAL SOLUTION: 125.0000 mg | Freq: Every day | ORAL | Status: DC

## 2016-06-21 MED ORDER — ADULT MULTIVITAMIN W/MINERALS CH
1.0000 | ORAL_TABLET | Freq: Every day | ORAL | Status: DC
  Administered 2016-06-21 – 2016-06-23 (×3): 1 via ORAL
  Filled 2016-06-21 (×3): qty 1

## 2016-06-21 MED ORDER — SODIUM CHLORIDE 0.9 % IV SOLN
INTRAVENOUS | Status: AC
  Administered 2016-06-21 – 2016-06-22 (×3): via INTRAVENOUS

## 2016-06-21 MED ORDER — SODIUM BICARBONATE 650 MG PO TABS
650.0000 mg | ORAL_TABLET | Freq: Three times a day (TID) | ORAL | Status: DC
  Administered 2016-06-21 – 2016-06-23 (×5): 650 mg via ORAL
  Filled 2016-06-21 (×5): qty 1

## 2016-06-21 MED ORDER — PREMIER PROTEIN SHAKE
11.0000 [oz_av] | Freq: Two times a day (BID) | ORAL | Status: DC
  Administered 2016-06-21 – 2016-06-22 (×2): 11 [oz_av] via ORAL
  Filled 2016-06-21 (×5): qty 325.31

## 2016-06-21 MED ORDER — VANCOMYCIN 50 MG/ML ORAL SOLUTION
125.0000 mg | Freq: Four times a day (QID) | ORAL | Status: DC
  Administered 2016-06-21 – 2016-06-23 (×9): 125 mg via ORAL
  Filled 2016-06-21 (×10): qty 2.5

## 2016-06-21 NOTE — Patient Outreach (Signed)
Call received from [facility] from Radiation Oncology (direct line [phone]) and reports they do require authorization from [place] and this process has already been started and can take up to 30 days. If pt does not want to wait on authorization then cancer center can contact [place] Medicare for coverage and pt would owe his coinsurance if VA does not give authorization and reimburse.  RN CM called daughter Jermichael Belmares and informed her of this process and she states pt to see Dr. Lisbeth Renshaw (radiation oncologist) next Tuesday 06/25/16 and then treatment plan will be sent to [facility], then hopefully authorization will go through, [name] states pt is in hospital with possible dehydration related to C-Diff and diarrhea. RN CM notified [place] of admission.  [name] [facility], [place] Coordinator [phone]

## 2016-06-21 NOTE — Progress Notes (Signed)
Hypoglycemic Event  CBG: 68   Treatment: 15 GM carbohydrate snack  Symptoms: None  Follow-up CBG: Time:0900 CBG Result:86  Possible Reasons for Event: Inadequate meal intake  Comments/MD notified:    Benjamin Hurley

## 2016-06-21 NOTE — Progress Notes (Signed)
Initial Nutrition Assessment  DOCUMENTATION CODES:   Non-severe (moderate) malnutrition in context of chronic illness  INTERVENTION:   Glucerna Shake po TID, each supplement provides 220 kcal and 10 grams of protein  Premier Protein BID, each supplement provides 160kcal and 30g protein.   Ensure Enlive po BID, each supplement provides 350 kcal and 20 grams of protein  Boost Breeze po TID, each supplement provides 250 kcal and 9 grams of protein  MVI  NUTRITION DIAGNOSIS:   Malnutrition (moderate) related to cancer and cancer related treatments as evidenced by moderate depletion of body fat, moderate depletions of muscle mass.  GOAL:   Patient will meet greater than or equal to 90% of their needs  MONITOR:   PO intake, Supplement acceptance, Labs, Weight trends, I & O's  REASON FOR ASSESSMENT:   Consult Assessment of nutrition requirement/status  ASSESSMENT:   83 y.o. gentleman with a history of chronic systolic heart failure (EF 30-35%), Type 2 DM with neuropathy, HTN, BPH, chronic atrial fibrillation (CHADS-Vasc score of 7, anticoagulated with Eliquis), and recent diagnosis of pancreatic cancer (family still hoping for treatment; due to seen in oncology clinic at [facility] next week) who has had multiple admissions since March (C diff, abdominal pain, hyponatremia, CHF).  He was last admitted to our system 4/15-4/18 for CHF exacerbation.  He was admitted to [facility] just last week for hyponatremia/dehydration.  He has had poor PO intake since his diagnosis of pancreatic cancer.  He is on a full liquid diet at home.  He developed loose stools prior to being discharged from [place] last week.  The family took him to his PCP at the [place] in [place] on Monday for evaluation.  Stool studies were sent based on his prior history of C diff colitis, and the family was notified yesterday that the stool sample was positive (again) for active C diff infection.  [facility] is [facility].   He has continued to have diarrhea.  His family is worried about dehydration and hyponatremia, so they brought him to the ED tonight to start treatment.   Met with pt in room today. Pt reports poor appetite and oral intake for several months but reports severe decrease over the past two weeks. Pt has chronic nausea and is permanately on a full liquid diet r/t partial colonic obstruction. Pt drinks Glucerna at home and mainly eats soups and ice cream. Pt states that he would never want a feeding tube; he prefers permanent liquid diet. Per chart, pt with weight gain. Pt does take daily MVI at home. RD discussed liquid diet with pt and gave him tips to increase protein and calories at home. Goal is for patient to get about 100g protein per day. RD will order multiple supplements for patient so that he can choose which one her prefers to drink. This will help to prevent pt from getting tired of one certain supplement and allow him to enjoy different flavors. Plan is for pt to possibly start radiation next week. Pt was given information for Dory Peru, RD, LDN who is the Dietitian at the [facility] and was told to follow up with her as outpatient.   Medications reviewed and include: insulin, protonix, carafate, vancomycin  Labs reviewed: Na 130(L), Cl 100(L), BUN 22(H)  Nutrition-Focused physical exam completed. Findings are moderate fat depletion in arms and chest, moderate muscle depletion over entire body, and mild edema.   Diet Order:  Diet full liquid Room service appropriate? Yes; Fluid consistency: Thin  Skin:  Reviewed, no issues  Last BM:  none since admit  Height:   Ht Readings from Last 1 Encounters:  06/21/16 _0  (1.676 m)    Weight:   Wt Readings from Last 1 Encounters:  06/21/16 153 lb (69.4 kg)    Ideal Body Weight:  64.5 kg  BMI:  Body mass index is 24.69 kg/m.  Estimated Nutritional Needs:   Kcal:  2000-2300kcal/day   Protein:  95-105g/day   Fluid:  >2L/day    EDUCATION NEEDS:   Education needs addressed  Koleen Distance, RD, LDN Pager #- [phone]

## 2016-06-21 NOTE — Consult Note (Signed)
   [facility] CM Inpatient Consult   06/21/2016  Benjamin Hurley 21-Feb-1925 [id number]    Notified of hospital admission by [facility]. Benjamin Hurley is active with [facility] program. Please see extensive patient outreach details by [facility] team under chart review tab then notes.  Will alert inpatient RNCM that [facility] is active. Will continue to follow.  Marthenia Rolling, MSN-Ed, RN,BSN [facility] Liaison [phone]

## 2016-06-21 NOTE — Progress Notes (Signed)
PROGRESS NOTE  Benjamin Hurley JQZ:[id number] DOB: 1925/03/06 DOA: 06/20/2016 PCP: Cleophas Dunker, MD  HPI/Recap of past 24 hours:  Frail, no fever, currently denies pain, or nausea, RN report no diarrhea since being admitted Family at bedside,   Assessment/Plan: Principal Problem:   C. difficile diarrhea Active Problems:   Diabetes mellitus, type 2 (HCC)   Chronic systolic CHF (congestive heart failure) (HCC)   Malnutrition of moderate degree   Pancreatic mass   Benign essential HTN   Atrial fibrillation, chronic (HCC)   C diff diarrhea without overt sepsis though blood pressures are borderline low --start oral vanc with prolonged course,  --Awaiting our C diff PCR on stool sample --Cautious hydration with history of CHF --FULL liquid diet as tolerated   Hyponatremia, likely hypovolemic given diarrhea, decreased PO intake --he does has some lower extremity edema, but lung clear, will continue Cautious fluids --Repeat BMP in the AM  Chronic atrial fibrillation -- half of home dose of metoprolol due to relative hypotension --Telemetry monitoring --Eliquis  HTN --Holding lisinopril due to relative hypotension  Chronic systolic heart failure --At risk for decompensation --Monitor for oxygen requirement --Strict I/O --Daily weights --I did not order fluid restriction because PO intake is already poor  DM, a1c 6.6 --Hold glipizide --SSI TID AC  Neuropathy --Neurontin  Glaucoma --Eyedrops  GERD --PPI, carafate  Prior h/o cva with left sided weakness  Recent diagnosis of pancreatic cancer with partial colonic obstruction, has been on liquid diet since march Over all poor prognosis --Oncology follow-up       DVT prophylaxis: Anticoagulated with Eliquis  Code Status: Full   Family Communication: patient , wife ( hard of hearing), daughter at bedside  Disposition Plan: family prefer return home with home health Overall very poor  prognosis, with frequent hospitalization this year ( the 6's hospitalization this years including [facility] [place] [place] [facility] , with chf, afib, prior cva and pancreatic tail adenocarcinoma, invading spleen, and possible colon at the splenic flexure (median survival is less than 6 months per oncology).  I have discussed with patient's daughter about palliative care consult, she expressed that her mother and dad may not be ready for the discussion but she is going to discuss with them.  Consultants:  none  Procedures:  none  Antibiotics:  Oral vanc   Objective: BP 110/77   Pulse (!) 110   Temp 97.6 F (36.4 C) (Oral)   Resp 18   Ht 5\' 6"  (1.676 m)   Wt 69.4 kg (153 lb)   SpO2 98%   BMI 24.69 kg/m   Intake/Output Summary (Last 24 hours) at 06/21/16 1328 Last data filed at 06/21/16 1100  Gross per 24 hour  Intake          1536.67 ml  Output                0 ml  Net          1536.67 ml   Filed Weights   06/21/16 0028  Weight: 69.4 kg (153 lb)    Exam:   General:  Pale and Frail, denies pain, no nausea , aaox3 this am  Cardiovascular: IRRR  Respiratory: CTABL  Abdomen: Soft/ND/NT, positive BS  Musculoskeletal: pitting Edema  Neuro: left hemiparesis from prior stroke  Data Reviewed: Basic Metabolic Panel:  Recent Labs Lab 06/20/16 2000 06/21/16 0518  NA 126* 130*  K 4.9 4.3  CL 96* 100*  CO2 20* 21*  GLUCOSE 101* 85  BUN 25* 22*  CREATININE 1.05 0.98  CALCIUM 8.9 8.9   Liver Function Tests:  Recent Labs Lab 06/20/16 2000  AST 24  ALT 11*  ALKPHOS 73  BILITOT 0.7  PROT 6.8  ALBUMIN 3.6   No results for input(s): LIPASE, AMYLASE in the last 168 hours. No results for input(s): AMMONIA in the last 168 hours. CBC:  Recent Labs Lab 06/20/16 2000 06/21/16 0518  WBC 8.2 7.5  NEUTROABS 4.8  --   HGB 12.3* 11.8*  HCT 36.1* 35.6*  MCV 84.7 86.2  PLT 260 228   Cardiac Enzymes:   No results for input(s): CKTOTAL,  CKMB, CKMBINDEX, TROPONINI in the last 168 hours. BNP (last 3 results)  Recent Labs  06/02/16 1412  BNP 1,005.6*    ProBNP (last 3 results) No results for input(s): PROBNP in the last 8760 hours.  CBG:  Recent Labs Lab 06/21/16 0801 06/21/16 0903 06/21/16 1144  GLUCAP 68 86 149*    No results found for this or any previous visit (from the past 240 hour(s)).   Studies: No results found.  Scheduled Meds: . apixaban  5 mg Oral BID  . feeding supplement (GLUCERNA SHAKE)  237 mL Oral Daily  . gabapentin  300 mg Oral QHS  . insulin aspart  0-9 Units Subcutaneous TID WC  . latanoprost  1 drop Both Eyes QHS  . metoprolol  25 mg Oral BID  . pantoprazole  40 mg Oral Daily  . polyvinyl alcohol  1 drop Both Eyes BID  . sodium chloride flush  3 mL Intravenous Q12H  . sucralfate  1 g Oral BID  . vancomycin  125 mg Oral QID   Followed by  . [START ON 07/05/2016] vancomycin  125 mg Oral BID   Followed by  . [START ON 07/13/2016] vancomycin  125 mg Oral Daily   Followed by  . [START ON 07/20/2016] vancomycin  125 mg Oral QODAY   Followed by  . [START ON 07/28/2016] vancomycin  125 mg Oral Q3 days    Continuous Infusions:   Time spent: 34mins  Raeana Blinn MD, PhD  [facility] Pager [phone]. If 7PM-7AM, please contact night-coverage at [email or web address], password [facility] 06/21/2016, 1:28 PM  LOS: 0 days

## 2016-06-22 DIAGNOSIS — Z7984 Long term (current) use of oral hypoglycemic drugs: Secondary | ICD-10-CM | POA: Diagnosis not present

## 2016-06-22 DIAGNOSIS — I5022 Chronic systolic (congestive) heart failure: Secondary | ICD-10-CM | POA: Diagnosis not present

## 2016-06-22 DIAGNOSIS — E860 Dehydration: Secondary | ICD-10-CM | POA: Diagnosis not present

## 2016-06-22 DIAGNOSIS — I482 Chronic atrial fibrillation: Secondary | ICD-10-CM | POA: Diagnosis not present

## 2016-06-22 DIAGNOSIS — Z7901 Long term (current) use of anticoagulants: Secondary | ICD-10-CM | POA: Diagnosis not present

## 2016-06-22 DIAGNOSIS — Z79899 Other long term (current) drug therapy: Secondary | ICD-10-CM | POA: Diagnosis not present

## 2016-06-22 DIAGNOSIS — Z886 Allergy status to analgesic agent status: Secondary | ICD-10-CM | POA: Diagnosis not present

## 2016-06-22 DIAGNOSIS — G629 Polyneuropathy, unspecified: Secondary | ICD-10-CM | POA: Diagnosis not present

## 2016-06-22 DIAGNOSIS — H409 Unspecified glaucoma: Secondary | ICD-10-CM | POA: Diagnosis not present

## 2016-06-22 DIAGNOSIS — I110 Hypertensive heart disease with heart failure: Secondary | ICD-10-CM | POA: Diagnosis not present

## 2016-06-22 DIAGNOSIS — K56600 Partial intestinal obstruction, unspecified as to cause: Secondary | ICD-10-CM | POA: Diagnosis not present

## 2016-06-22 DIAGNOSIS — I6529 Occlusion and stenosis of unspecified carotid artery: Secondary | ICD-10-CM | POA: Diagnosis not present

## 2016-06-22 DIAGNOSIS — R51 Headache: Secondary | ICD-10-CM | POA: Diagnosis not present

## 2016-06-22 DIAGNOSIS — I69354 Hemiplegia and hemiparesis following cerebral infarction affecting left non-dominant side: Secondary | ICD-10-CM | POA: Diagnosis not present

## 2016-06-22 DIAGNOSIS — E1140 Type 2 diabetes mellitus with diabetic neuropathy, unspecified: Secondary | ICD-10-CM | POA: Diagnosis not present

## 2016-06-22 DIAGNOSIS — E871 Hypo-osmolality and hyponatremia: Secondary | ICD-10-CM | POA: Diagnosis not present

## 2016-06-22 DIAGNOSIS — C259 Malignant neoplasm of pancreas, unspecified: Secondary | ICD-10-CM | POA: Diagnosis not present

## 2016-06-22 DIAGNOSIS — E861 Hypovolemia: Secondary | ICD-10-CM | POA: Diagnosis not present

## 2016-06-22 DIAGNOSIS — M4692 Unspecified inflammatory spondylopathy, cervical region: Secondary | ICD-10-CM | POA: Diagnosis not present

## 2016-06-22 DIAGNOSIS — N400 Enlarged prostate without lower urinary tract symptoms: Secondary | ICD-10-CM | POA: Diagnosis not present

## 2016-06-22 DIAGNOSIS — I959 Hypotension, unspecified: Secondary | ICD-10-CM | POA: Diagnosis not present

## 2016-06-22 DIAGNOSIS — A0472 Enterocolitis due to Clostridium difficile, not specified as recurrent: Secondary | ICD-10-CM | POA: Diagnosis not present

## 2016-06-22 DIAGNOSIS — E440 Moderate protein-calorie malnutrition: Secondary | ICD-10-CM | POA: Diagnosis not present

## 2016-06-22 DIAGNOSIS — K219 Gastro-esophageal reflux disease without esophagitis: Secondary | ICD-10-CM | POA: Diagnosis not present

## 2016-06-22 LAB — GLUCOSE, CAPILLARY
GLUCOSE-CAPILLARY: 129 mg/dL — AB (ref 65–99)
GLUCOSE-CAPILLARY: 114 mg/dL — AB (ref 65–99)
Glucose-Capillary: 167 mg/dL — ABNORMAL HIGH (ref 65–99)
Glucose-Capillary: 193 mg/dL — ABNORMAL HIGH (ref 65–99)

## 2016-06-22 LAB — BASIC METABOLIC PANEL
ANION GAP: 5 (ref 5–15)
BUN: 16 mg/dL (ref 6–20)
CALCIUM: 8.5 mg/dL — AB (ref 8.9–10.3)
CO2: 22 mmol/L (ref 22–32)
CREATININE: 0.9 mg/dL (ref 0.61–1.24)
Chloride: 103 mmol/L (ref 101–111)
GLUCOSE: 128 mg/dL — AB (ref 65–99)
Potassium: 4.8 mmol/L (ref 3.5–5.1)
Sodium: 130 mmol/L — ABNORMAL LOW (ref 135–145)

## 2016-06-22 LAB — MAGNESIUM: Magnesium: 1.4 mg/dL — ABNORMAL LOW (ref 1.7–2.4)

## 2016-06-22 MED ORDER — MIRTAZAPINE 15 MG PO TBDP
7.5000 mg | ORAL_TABLET | Freq: Every day | ORAL | Status: DC
  Administered 2016-06-22: 7.5 mg via ORAL
  Filled 2016-06-22 (×2): qty 0.5

## 2016-06-22 MED ORDER — MAGNESIUM SULFATE 2 GM/50ML IV SOLN
2.0000 g | Freq: Once | INTRAVENOUS | Status: AC
  Administered 2016-06-22: 2 g via INTRAVENOUS
  Filled 2016-06-22: qty 50

## 2016-06-22 NOTE — Progress Notes (Signed)
PROGRESS NOTE  Benjamin Hurley [id number] DOB: 1925/09/17 DOA: 06/20/2016 PCP: Cleophas Dunker, MD  HPI/Recap of past 24 hours:  Frail, no fever, report left sided dull pain, and some nausea, RN report no diarrhea since being admitted Family at bedside,   Assessment/Plan: Principal Problem:   C. difficile diarrhea Active Problems:   Diabetes mellitus, type 2 (HCC)   Chronic systolic CHF (congestive heart failure) (HCC)   Malnutrition of moderate degree   Pancreatic mass   Benign essential HTN   Atrial fibrillation, chronic (HCC)   C diff diarrhea without overt sepsis though blood pressures are borderline low --start oral vanc with prolonged course,  --Awaiting our C diff PCR on stool sample --Cautious hydration with history of CHF --FULL liquid diet as tolerated   Hyponatremia, likely hypovolemic given diarrhea, decreased PO intake --he does has some lower extremity edema, but lung clear, will continue Cautious fluids --Repeat BMP in the AM  Chronic atrial fibrillation -- half of home dose of metoprolol due to relative hypotension --Telemetry monitoring --Eliquis  HTN --Holding lisinopril due to relative hypotension  Chronic systolic heart failure --At risk for decompensation --Monitor for oxygen requirement --Strict I/O --Daily weights --I did not order fluid restriction because PO intake is already poor  DM, a1c 6.6 --Hold glipizide --SSI TID AC  Neuropathy --Neurontin  Glaucoma --Eyedrops  GERD --PPI, carafate  Prior h/o cva with left sided weakness  Recent diagnosis of pancreatic cancer with partial colonic obstruction, has been on liquid diet since march Over all poor prognosis --Oncology follow-up       DVT prophylaxis: Anticoagulated with Eliquis  Code Status: Full   Family Communication: patient , wife ( hard of hearing), daughter at bedside  Disposition Plan: family prefer return home with home health, patient  report patient does not want to go to SNF Overall very poor prognosis, with frequent hospitalization this year ( the 6's hospitalization this years including [facility], one [facility] [place], [facility] [place] [facility]) , with chf, afib, prior cva and pancreatic tail adenocarcinoma, invading spleen, and possible colon at the splenic flexure (median survival is less than 6 months per oncology).  I have discussed  Again with patient's daughter and patient's wife about palliative care consult today  , they are now open to discuss with palliative care  Consultants:  Palliative care  Procedures:  none  Antibiotics:  Oral vanc   Objective: BP 105/73   Pulse 88   Temp 97.8 F (36.6 C) (Oral)   Resp 18   Ht 5\' 6"  (1.676 m)   Wt 72 kg (158 lb 11.7 oz)   SpO2 99%   BMI 25.62 kg/m   Intake/Output Summary (Last 24 hours) at 06/22/16 1809 Last data filed at 06/22/16 1424  Gross per 24 hour  Intake              545 ml  Output             1825 ml  Net            -1280 ml   Filed Weights   06/21/16 0028 06/22/16 0625  Weight: 69.4 kg (153 lb) 72 kg (158 lb 11.7 oz)    Exam:   General:  Pale and Frail, denies pain, no nausea , aaox3 this am  Cardiovascular: IRRR  Respiratory: CTABL  Abdomen: Soft/ND/NT, positive BS  Musculoskeletal: pitting Edema  Neuro: left hemiparesis from prior stroke  Data Reviewed: Basic Metabolic Panel:  Recent Labs Lab  06/20/16 2000 06/21/16 0518 06/22/16 0530  NA 126* 130* 130*  K 4.9 4.3 4.8  CL 96* 100* 103  CO2 20* 21* 22  GLUCOSE 101* 85 128*  BUN 25* 22* 16  CREATININE 1.05 0.98 0.90  CALCIUM 8.9 8.9 8.5*  MG  --   --  1.4*   Liver Function Tests:  Recent Labs Lab 06/20/16 2000  AST 24  ALT 11*  ALKPHOS 73  BILITOT 0.7  PROT 6.8  ALBUMIN 3.6   No results for input(s): LIPASE, AMYLASE in the last 168 hours. No results for input(s): AMMONIA in the last 168 hours. CBC:  Recent Labs Lab 06/20/16 2000 06/21/16 0518  WBC 8.2  7.5  NEUTROABS 4.8  --   HGB 12.3* 11.8*  HCT 36.1* 35.6*  MCV 84.7 86.2  PLT 260 228   Cardiac Enzymes:   No results for input(s): CKTOTAL, CKMB, CKMBINDEX, TROPONINI in the last 168 hours. BNP (last 3 results)  Recent Labs  06/02/16 1412  BNP 1,005.6*    ProBNP (last 3 results) No results for input(s): PROBNP in the last 8760 hours.  CBG:  Recent Labs Lab 06/21/16 1626 06/21/16 2054 06/22/16 0802 06/22/16 1115 06/22/16 1641  GLUCAP 147* 155* 129* 114* 167*    No results found for this or any previous visit (from the past 240 hour(s)).   Studies: No results found.  Scheduled Meds: . apixaban  5 mg Oral BID  . feeding supplement  1 Container Oral TID BM  . feeding supplement (ENSURE ENLIVE)  237 mL Oral BID BM  . feeding supplement (GLUCERNA SHAKE)  237 mL Oral Daily  . gabapentin  300 mg Oral QHS  . insulin aspart  0-9 Units Subcutaneous TID WC  . latanoprost  1 drop Both Eyes QHS  . metoprolol  25 mg Oral BID  . mirtazapine  7.5 mg Oral QHS  . multivitamin with minerals  1 tablet Oral Daily  . pantoprazole  40 mg Oral Daily  . polyvinyl alcohol  1 drop Both Eyes BID  . protein supplement shake  11 oz Oral BID BM  . sodium bicarbonate  650 mg Oral TID  . sodium chloride flush  3 mL Intravenous Q12H  . sucralfate  1 g Oral BID  . vancomycin  125 mg Oral QID   Followed by  . [START ON 07/05/2016] vancomycin  125 mg Oral BID   Followed by  . [START ON 07/13/2016] vancomycin  125 mg Oral Daily   Followed by  . [START ON 07/20/2016] vancomycin  125 mg Oral QODAY   Followed by  . [START ON 07/28/2016] vancomycin  125 mg Oral Q3 days    Continuous Infusions: . sodium chloride 75 mL/hr at 06/22/16 7482     Time spent: 20mins  Jaquel Glassburn MD, PhD  [facility] Pager [phone]. If 7PM-7AM, please contact night-coverage at [email or web address], password [facility] 06/22/2016, 6:09 PM  LOS: 0 days

## 2016-06-22 NOTE — Consult Note (Signed)
Consultation Note Date: 06/22/2016   Patient Name: Benjamin Hurley  DOB: 1925-04-08  MRN: [id number]  Age / Sex: 83 y.o., male  PCP: Cleophas Dunker, MD Referring Physician: Florencia Reasons, MD  Reason for Consultation: Establishing goals of care  HPI/Patient Profile: 83 y.o. male   admitted on 06/20/2016    Clinical Assessment and Goals of Care:  Life limiting illness: 83 yo gentleman with Chf, afib, prior cva and pancreatic tail adenocarcinoma, invading spleen, and possible colon at the splenic flexure (median survival is less than 6 months per oncology). He is now admitted with C diff diarrhea, hyponatremia, overall worsening functional status, diminished mobility, diminishing oral intake/appetite, high symptom burden from nausea, lack of appetite. A palliative consult has been requested for additional goals of care discussions.   The patient is resting in bed, I have met with him and his family in a past hospitalization. I re introduced myself and palliative care as follows: Palliative medicine is specialized medical care for people living with serious illness. It focuses on providing relief from the symptoms and stress of a serious illness. The goal is to improve quality of life for both the patient and the family.  Brief life review performed, goals, values and wishes discussed. 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   NEXT OF KIN  wife, daughter, 2 sons.   DISCUSSIONS/ RECOMMENDATIONS:   Agree with Zofran Add Remeron for appetite and sleep and monitor Full code for now, patient and family are waiting for their appointment with Dr Lisbeth Renshaw for  radiation options, I have reviewed the same information that they have received from Dr Burr Medico with medical oncology, that the nature of all treatments is palliative. 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 I will work towards symptom management and trust building first, and gently continue code status conversations.  Thank you for the consult.   Code Status/Advance Care Planning:  Full code    Symptom Management:    as above   Palliative Prophylaxis:   Delirium Protocol  Additional Recommendations (Limitations, Scope, Preferences):  Full Scope Treatment  Psycho-social/Spiritual:   Desire for further Chaplaincy support:no  Additional Recommendations: Education on Hospice  Prognosis:   < 6 months  Discharge Planning: Home with Home Health      Primary Diagnoses: Present on Admission: . C. difficile diarrhea . Chronic systolic CHF (congestive heart failure) ([place]) . Malnutrition of moderate degree . Pancreatic mass . Benign essential HTN   I have reviewed the medical record, interviewed the patient and family, and examined the patient. The following aspects are pertinent.  Past Medical History:  Diagnosis Date  . A-fib (  [place])    on Eliquis  . Arthritis    neck  . BPH (benign prostatic hyperplasia)   . Cancer [facility])    pancreatic cancer  . Carotid stenosis   . CHF (congestive heart failure) ([place])   . Diabetes ([place])    type 2  . Essential hypertension   . GERD (gastroesophageal reflux disease)   . Glaucoma    both eyes  . Headache   . Peripheral neuropathy   . Stroke [facility]) 2014   left side weakness   Social History   Social History  . Marital status: Married    Spouse name: N/A  . Number of children: N/A  . Years of education: N/A   Occupational History  . retired    Social History Main Topics  . Smoking status: Former Smoker    Years: 5.00      Types: Pipe    Quit date: 1968  . Smokeless tobacco: Former Systems developer    Types: Chew    Quit date: 1968  . Alcohol use No  . Drug use: No  . Sexual activity: Not Asked   Other Topics Concern  . None   Social History Narrative  . None   Family History  Problem Relation Age of Onset  . Cancer Mother     cancer    Scheduled Meds: . apixaban  5 mg Oral BID  . feeding supplement  1 Container Oral TID BM  . feeding supplement (ENSURE ENLIVE)  237 mL Oral BID BM  . feeding supplement (GLUCERNA SHAKE)  237 mL Oral Daily  . gabapentin  300 mg Oral QHS  . insulin aspart  0-9 Units Subcutaneous TID WC  . latanoprost  1 drop Both Eyes QHS  . metoprolol  25 mg Oral BID  . mirtazapine  7.5 mg Oral QHS  . multivitamin with minerals  1 tablet Oral Daily  . pantoprazole  40 mg Oral Daily  . polyvinyl alcohol  1 drop Both Eyes BID  . protein supplement shake  11 oz Oral BID BM  . sodium bicarbonate  650 mg Oral TID  . sodium chloride flush  3 mL Intravenous Q12H  . sucralfate  1 g Oral BID  . vancomycin  125 mg Oral QID   Followed by  . [START ON 07/05/2016] vancomycin  125 mg Oral BID   Followed by  . [START ON 07/13/2016] vancomycin  125 mg Oral Daily   Followed by  . [START ON 07/20/2016] vancomycin  125 mg Oral QODAY   Followed by  . [START ON 07/28/2016] vancomycin  125 mg Oral Q3 days   Continuous Infusions: . sodium chloride 75 mL/hr at 06/22/16 0614   PRN Meds:.acetaminophen **OR** acetaminophen, ondansetron **OR** ondansetron (ZOFRAN) IV Medications Prior to Admission:  Prior to Admission medications   Medication Sig Start Date End Date Taking? Authorizing Provider  acetaminophen (TYLENOL) 325 MG tablet Take 2 tablets (650 mg total) by mouth every 6 (six) hours as needed for moderate pain. 05/17/16  Yes Arrien, Jimmy Picket, MD  apixaban (ELIQUIS) 5 MG TABS tablet Take 5 mg by mouth 2 (two) times daily.   Yes [provider]  carboxymethylcellulose (REFRESH PLUS)  0.5 % SOLN Place 1 drop into both eyes 2 (two) times daily.   Yes [provider]  feeding supplement, GLUCERNA SHAKE, (GLUCERNA SHAKE) LIQD Take 237 mLs by mouth daily.   Yes [provider]  gabapentin (NEURONTIN) 100 MG capsule Take 300 mg by mouth  at bedtime. 03/13/16  Yes [provider]  glipiZIDE (GLUCOTROL) 5 MG tablet Take 1 tablet (5 mg total) by mouth 2 (two) times daily before a meal. 06/05/16  Yes Barton Dubois, MD  latanoprost (XALATAN) 0.005 % ophthalmic solution Place 1 drop into both eyes at bedtime.   Yes [provider]  lisinopril (PRINIVIL,as) 5 MG tablet Take 1 tablet (5 mg total) by mouth daily. 06/06/16  Yes Barton Dubois, MD  metoprolol (LOPRESSOR) 50 MG tablet Take 1 tablet (50 mg total) by mouth 2 (two) times daily. 05/17/16  Yes Arrien, Jimmy Picket, MD  Multiple Vitamin (MULTIVITAMIN WITH MINERALS) TABS tablet Take 1 tablet by mouth daily. 05/17/16  Yes Arrien, Jimmy Picket, MD  pantoprazole (PROTONIX) 40 MG tablet Take 40 mg by mouth daily.   Yes [provider]  promethazine (PHENERGAN) 25 MG tablet Take 12.5 mg by mouth every 4 (four) hours as needed for nausea or vomiting.   Yes [provider]  simethicone (MYLICON) 80 MG chewable tablet Chew 80 mg by mouth every 6 (six) hours as needed for flatulence.   Yes [provider]  sucralfate (CARAFATE) 1 GM/10ML suspension Take 1 g by mouth 2 (two) times daily.   Yes [provider]   Allergies  Allergen Reactions  . Aspirin Nausea Only and Other (See Comments)    Makes pt very sick   Review of Systems +nausea +lack of appetite  Physical Exam Weak appearing gentleman Paleas Regular Clear Abdomen soft Has some edema Awake alert L sided chronic weakness  Vital Signs: BP 105/73   Pulse 88   Temp 97.8 F (36.6 C) (Oral)   Resp 18   Ht _0  (1.676 m)   Wt 72 kg (158 lb 11.7 oz)   SpO2 99%   BMI 25.62 kg/m  Pain Assessment:  0-10   Pain Score: Asleep   SpO2: SpO2: 99 % O2 Device:SpO2: 99 % O2 Flow Rate: .   IO: Intake/output summary:  Intake/Output Summary (Last 24 hours) at 06/22/16 1536 Last data filed at 06/22/16 1424  Gross per 24 hour  Intake              545 ml  Output             1825 ml  Net            -1280 ml    LBM: Last BM Date: 06/20/16 Baseline Weight: Weight: 69.4 kg (153 lb) Most recent weight: Weight: 72 kg (158 lb 11.7 oz)     Palliative Assessment/Data:   Flowsheet Rows     Most Recent Value  Intake Tab  Referral Department  Hospitalist  Unit at Time of Referral  Oncology Unit  Palliative Care Primary Diagnosis  Cancer  Palliative Care Type  Return patient Palliative Care  Reason for referral  Clarify Goals of Care  Date first seen by Palliative Care  06/22/16  Clinical Assessment  Palliative Performance Scale Score  30%  Pain Max last 24 hours  4  Pain Min Last 24 hours  3  Dyspnea Max Last 24 Hours  4  Dyspnea Min Last 24 hours  3  Nausea Max Last 24 Hours  8  Nausea Min Last 24 Hours  6  Psychosocial & Spiritual Assessment  Palliative Care Outcomes  Patient/Family meeting held?  Yes  Who was at the meeting?  patient wife daughter   Palliative Care Outcomes  Clarified goals of care  Time In:  1430 Time Out:  1540 Time Total:  70 min  Greater than 50%  of this time was spent counseling and coordinating care related to the above assessment and plan.  Signed by: Loistine Chance, MD  [phone]  Please contact Palliative Medicine Team phone at [phone] for questions and concerns.  For individual provider: See Shea Evans

## 2016-06-22 NOTE — Evaluation (Signed)
Physical Therapy Evaluation Patient Details Name: Benjamin Hurley MRN: [id number] DOB: [birth year] Today's Date: 06/22/2016   History of Present Illness  83 yo male admitted with C Digff,  recently in hospital for CHF, H/O DM, neuropathy, CVA, pancreatic cancer, A fib.   Clinical Impression  The patient presents with decreased endurance and an change in functional mobility compared to recent admission where he was ambulatory.. Pt admitted with above diagnosis. Pt currently with functional limitations due to the deficits listed below (see PT Problem List).  Pt will benefit from skilled PT to increase their independence and safety with mobility to allow discharge to the venue listed below.       Follow Up Recommendations Home health PT;Supervision/Assistance - 24 hour    Equipment Recommendations  None recommended by PT    Recommendations for Other Services       Precautions / Restrictions Precautions Precautions: Fall      Mobility  Bed Mobility Overal bed mobility: Needs Assistance Bed Mobility: Supine to Sit     Supine to sit: Min assist     General bed mobility comments: assist with trunk to sitting upright.  Transfers Overall transfer level: Needs assistance   Transfers: Sit to/from Stand;Stand Pivot Transfers Sit to Stand: Mod assist Stand pivot transfers: Mod assist       General transfer comment: hand hold for partial stand and pivot  Ambulation/Gait                Stairs            Wheelchair Mobility    Modified Rankin (Stroke Patients Only)       Balance                                             Pertinent Vitals/Pain Pain Assessment: No/denies pain    Home Living Family/patient expects to be discharged to:: Private residence Living Arrangements: Spouse/significant other Available Help at Discharge: Family Type of Home: House Home Access: Stairs to enter   Technical brewer of Steps: 1   Home  Equipment: Shower seat;Grab bars - toilet;Hand held shower head;Wheelchair - manual      Prior Function Level of Independence: Needs assistance   Gait / Transfers Assistance Needed: ambulatory with RW usually however pt stated he has been using wheelchair for last week or so prior to this admission  ADL's / Homemaking Assistance Needed: wife assisting with bathing, dressing  Comments: RW for ambulation     Hand Dominance        Extremity/Trunk Assessment   Upper Extremity Assessment Upper Extremity Assessment: Generalized weakness    Lower Extremity Assessment RLE Sensation: history of peripheral neuropathy LLE Deficits / Details: weak dorsiflexion 2* prior CVA LLE Sensation: history of peripheral neuropathy    Cervical / Trunk Assessment Cervical / Trunk Assessment: Kyphotic  Communication   Communication: No difficulties  Cognition Arousal/Alertness: Awake/alert Behavior During Therapy: WFL for tasks assessed/performed Overall Cognitive Status: Within Functional Limits for tasks assessed                                        General Comments      Exercises     Assessment/Plan    PT Assessment Patient needs continued PT services  PT Problem  List Decreased strength;Decreased mobility;Decreased balance;Decreased knowledge of use of DME;Decreased activity tolerance       PT Treatment Interventions DME instruction;Gait training;Therapeutic activities;Therapeutic exercise;Patient/family education;Functional mobility training;Balance training    PT Goals (Current goals can be found in the Care Plan section)  Acute Rehab PT Goals Patient Stated Goal: to get stronger PT Goal Formulation: With patient/family Time For Goal Achievement: 07/06/16 Potential to Achieve Goals: Good    Frequency Min 3X/week   Barriers to discharge        Co-evaluation               AM-PAC PT "6 Clicks" Daily Activity  Outcome Measure Difficulty turning  over in bed (including adjusting bedclothes, sheets and blankets)?: A Lot Difficulty moving from lying on back to sitting on the side of the bed? : A Lot Difficulty sitting down on and standing up from a chair with arms (e.g., wheelchair, bedside commode, etc,.)?: A Lot Help needed moving to and from a bed to chair (including a wheelchair)?: A Lot Help needed walking in hospital room?: A Lot Help needed climbing 3-5 steps with a railing? : A Lot 6 Click Score: 12    End of Session Equipment Utilized During Treatment: Gait belt Activity Tolerance: Patient tolerated treatment well Patient left: in chair;with call bell/phone within reach;with family/visitor present Nurse Communication: (P) Mobility status PT Visit Diagnosis: Muscle weakness (generalized) (M62.81);Difficulty in walking, not elsewhere classified (R26.2)    Time: 7035-0093 PT Time Calculation (min) (ACUTE ONLY): 25 min   Charges:   PT Evaluation $PT Eval Low Complexity: 1 Procedure PT Treatments $Therapeutic Activity: 8-22 mins   PT G Codes:   PT G-Codes **NOT FOR INPATIENT CLASS** Functional Assessment Tool Used: AM-PAC 6 Clicks Basic Mobility;Clinical judgement Functional Limitation: Mobility: Walking and moving around Mobility: Walking and Moving Around Current Status (G1829): At least 60 percent but less than 80 percent impaired, limited or restricted Mobility: Walking and Moving Around Goal Status [phone]): At least 20 percent but less than 40 percent impaired, limited or restricted    [facility] PT [phone]   Claretha Cooper 06/22/2016, 6:03 PM

## 2016-06-22 NOTE — Care Management Note (Signed)
[facility] NOTIFICATION   Patient Details  Name: Benjamin Hurley MRN: [id number] Date of Birth: 10/27/1925   Medicare Observation Status Notification Given:   yes    Norina Buzzard, RN 06/22/2016, 12:52 PM

## 2016-06-23 LAB — BASIC METABOLIC PANEL
Anion gap: 6 (ref 5–15)
BUN: 13 mg/dL (ref 6–20)
CHLORIDE: 106 mmol/L (ref 101–111)
CO2: 21 mmol/L — ABNORMAL LOW (ref 22–32)
CREATININE: 0.66 mg/dL (ref 0.61–1.24)
Calcium: 8.5 mg/dL — ABNORMAL LOW (ref 8.9–10.3)
GFR calc Af Amer: >60 mL/min (ref >60)
GFR calc non Af Amer: >60 mL/min (ref >60)
GLUCOSE: 128 mg/dL — AB (ref 65–99)
POTASSIUM: 4.6 mmol/L (ref 3.5–5.1)
SODIUM: 133 mmol/L — AB (ref 135–145)

## 2016-06-23 LAB — GLUCOSE, CAPILLARY
Glucose-Capillary: 105 mg/dL — ABNORMAL HIGH (ref 65–99)
Glucose-Capillary: 155 mg/dL — ABNORMAL HIGH (ref 65–99)

## 2016-06-23 LAB — MAGNESIUM: Magnesium: 1.7 mg/dL (ref 1.7–2.4)

## 2016-06-23 MED ORDER — PREMIER PROTEIN SHAKE: 11.0000 [oz_av] | Freq: Two times a day (BID) | ORAL | 0 refills | Status: DC

## 2016-06-23 MED ORDER — SODIUM BICARBONATE 650 MG PO TABS: 650.0000 mg | ORAL_TABLET | Freq: Every day | ORAL | 0 refills | Status: AC

## 2016-06-23 MED ORDER — VANCOMYCIN HCL 125 MG PO CAPS: ORAL_CAPSULE | ORAL | 0 refills | Status: DC

## 2016-06-23 NOTE — Care Management Note (Signed)
Case Management Note  Patient Details  Name: Benjamin Hurley MRN: [id number] Date of Birth: 02/26/1925  Subjective/Objective:                   Patient history of chronic systolic heart failure (EF 30-35%), Type 2 DM with neuropathy, HTN, BPH, chronic atrial fibrillation (CHADS-Vasc score of 7, anticoagulated with Eliquis), and recent diagnosis of pancreatic cancer (family still hoping for treatment; due to seen in oncology clinic at [facility] next week) who has had multiple admissions since March (C diff, abdominal pain, hyponatremia, CHF). He was last admitted to our system 4/15-4/18 for CHF exacerbation. He was admitted to [facility] just last week for hyponatremia/dehydration. He has had poor PO intake since his diagnosis of pancreatic cancer. He is on a full liquid diet at home. He developed loose stools prior to being discharged from [place] last week. The family took him to his PCP at the [facility] in [place] on Monday for evaluation. Stool studies were sent based on his prior history of C diff colitis, and the family was notified yesterday that the stool sample was positive (again) for active C diff infection. [facility]. He has continued to have diarrhea. His family is worried about dehydration and hyponatremia, so they brought him to the ED on 06/20/16 to start treatment.  Action/Plan:  Spoke with patient, daughter Benjamin Hurley), patient's nurse (Benjamin Hurley), and Benjamin Hurley at [place] regarding discharge plan to home with home health, and home oxygen.   Daughter states patient can not afford to pay the insurance copayments for each home health visits and out of pocket cost for home oxygen.   Daughter aware that patient may not meet criteria to have home O2 covered.  Daughter declines home health services and home O2 services at this time.  States they will continue to receive services through [facility], is aware that these services will not replace home health  services, and will reassess at a longer time if home health services need to be resumed.  Daughter aware that patient will need  new orders for [facility] services once discharge from hospital and voices understanding.  States she may also want patient to be assessed by palliative care or hospice for services in the future. Daughter states patient does not have any CM needs at this time.   RNCM advised will notify [facility] of patient's discharge. Inbasket message update sent to [facility] and [facility]. Patient's nurse will notify patient's MD of families refusal of services and new discharge plan to home without home health services.  Benjamin Hurley at [employer] notified of patient's refusal of services.    Expected Discharge Date:  06/23/16               Expected Discharge Plan:  Home/Self Care (Patient's daughter declined [facility] services.)  In-House Referral:  NA  Discharge planning Services  CM Consult  Post Acute Care Choice:  NA Choice offered to:  Adult Children  DME Arranged:    DME Agency:     HH Arranged:  NA HH Agency:  NA  Status of Service:  Completed, signed off  If discussed at [place] of Stay Meetings, dates discussed:    Additional Comments:  Benjamin Colonel, RN 06/23/2016, 7:56 PM

## 2016-06-23 NOTE — Progress Notes (Signed)
Patient discharged home with wife/daughter, discharge instructions/prescription given and explained to patient/daughter/wife and they verbalized understanding, patient/wife/daughter refuses all home health, Dr. Erlinda Hong notified. No wound noted, skin intact. Patient denies any distress, accompanied home by wife/daughter.

## 2016-06-23 NOTE — Discharge Summary (Signed)
Discharge Summary  Benjamin Hurley YYT:[id number] DOB: 11-May-1925  PCP: Cleophas Dunker, MD  Admit date: 06/20/2016 Discharge date: 06/23/2016  Time spent: >59mins  Recommendations for Outpatient Follow-up:  1. F/u with PMD within a week  for hospital discharge follow up, repeat cbc/bmp at follow up 2. f/u with radiation oncology on 5/8 3. f/u with oncology in three weeks 4. Maximize home health, will benefit from transition to hospice service in the near future 5.  recommend follow up with community hospice service, family is currently indecisive about this, but is thinking about contacting hospice.  Discharge Diagnoses:  Active Hospital Problems   Diagnosis Date Noted  . C. difficile diarrhea 06/20/2016  . Atrial fibrillation, chronic ([facility]) 06/20/2016  . Benign essential HTN   . Pancreatic mass   . Malnutrition of moderate degree 05/15/2016  . Chronic systolic CHF (congestive heart failure) ([facility]) 05/14/2016  . Diabetes mellitus, type 2 ([facility]) 05/10/2016    Resolved Hospital Problems   Diagnosis Date Noted Date Resolved  No resolved problems to display.    Discharge Condition: stable  Diet recommendation: diet as tolerated, patient has been on liquid diet for the last three months due to pancreatic cancer causing partial colonic obstruction and intermittent n/v.  Filed Weights   06/21/16 0028 06/22/16 0625 06/23/16 0522  Weight: 69.4 kg (153 lb) 72 kg (158 lb 11.7 oz) 71.7 kg (158 lb 1.1 oz)    History of present illness:  PCP: Cleophas Dunker, MD   Patient coming from: Home  Chief Complaint: Diarrhea, decreased PO intake, generalized weakness  HPI: Benjamin Hurley is a 83 y.o. gentleman with a history of chronic systolic heart failure (EF 30-35%), Type 2 DM with neuropathy, HTN, BPH, chronic atrial fibrillation (CHADS-Vasc score of 7, anticoagulated with Eliquis), and recent diagnosis of pancreatic cancer (family still hoping for treatment; due to seen in oncology  clinic at [facility] next week) who has had multiple admissions since March (C diff, abdominal pain, hyponatremia, CHF).  He was last admitted to our system 4/15-4/18 for CHF exacerbation.  He was admitted to [facility] just last week for hyponatremia/dehydration.  He has had poor PO intake since his diagnosis of pancreatic cancer.  He is on a full liquid diet at home.  He developed loose stools prior to being discharged from [facility] last week.  The family took him to his PCP at the [place] [facility] in [place] on Monday for evaluation.  Stool studies were sent based on his prior history of C diff colitis, and the family was notified yesterday that the stool sample was positive (again) for active C diff infection.  [facility]  He has continued to have diarrhea.  His family is worried about dehydration and hyponatremia, so they brought him to the ED tonight to start treatment.  The patient has generalized weakness.  No falls.  No LOC.  No headache.  No chest pain or shortness of breath.  He has bilateral ankle and foot edema.  Intermittent nausea and vomiting.  Abdominal pain has not been any worse.  No gross bleeding.  No fever.   ED Course: Normal WBC count.  Hgb 12.  Sodium 126 (reportedly 130 upon discharge from [facility] last week).  Bicarb 20.  Lactic acid level 1.7.  BUN 25.  Normal creatinine.  DIFICID ordered in the ED.  1L NS bolus given in the ED.  Hospitalist asked to admit.  Hospital Course:  Principal Problem:   C. difficile diarrhea Active  Problems:   Diabetes mellitus, type 2 (HCC)   Chronic systolic CHF (congestive heart failure) (HCC)   Malnutrition of moderate degree   Pancreatic mass   Benign essential HTN   Atrial fibrillation, chronic (HCC)   C diff diarrhea without overt sepsis though blood pressures are borderline low --start oral vanc with prolonged course,  --he did not have diarrhea in the hospital ----FULL liquid diet as tolerated   Hyponatremia, likely  hypovolemic given diarrhea, decreased PO intake --he does has some lower extremity edema, but lung clear, he received Cautious fluids --sodium improved from 126 to 133  HTN --continue home dose metoprolol, d/c lisinopril due to relative hypotension  Chronic atrial fibrillation -- half of home dose of metoprolol due to relative hypotension initial on admission --he is discharged on full home does metoprolol, now that blood pressure has improved --Eliquis  Chronic systolic heart failure, lvef 35% in 04/2016 -he does has lower extremity edema, but lung clear -close monitor volume status -moving forward with continued poor oral intake, patient will likely more prone to have dehydration rather than volume overload.  DM, a1c 6.6 --d/c glipizide, moving forward, patient is at risk of having hypoglycemia due to poor oral intake and cancer progression   Neuropathy --Neurontin  Glaucoma --Eyedrops  GERD --PPI, carafate  Prior h/o cva with left sided weakness  Recent diagnosis of pancreatic cancer with partial colonic obstruction, has been on liquid diet since march Over all poor prognosis --rad onc and Oncology follow-up   Overall very poor prognosis, with frequent hospitalization this year ( the 6's hospitalization this years including [facility] [place], three [facility]) , with chf, afib, prior cva and pancreatic tail adenocarcinoma, invading spleen, 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 Multiple discussion made with patient's daughter and patient's wife about palliative care/ hospice,  Family is indecisive as of now, they want to meet with radiation oncology on 5/8 to get more information.     DVT prophylaxis:Anticoagulated with Eliquis  Code Status: Full   Family Communication: patient , wife ( hard of hearing), daughter  at bedside  Disposition Plan: family prefer return home with home health, patient does not want to go to SNF  Consultants:  Palliative care  Procedures:  none  Antibiotics:  Oral vanc   Discharge Exam: BP 135/76   Pulse (!) 102   Temp 97.8 F (36.6 C) (Oral)   Resp 16   Ht 5\' 6"  (1.676 m)   Wt 71.7 kg (158 lb 1.1 oz)   SpO2 99%   BMI 25.51 kg/m   General: frail, pale, chronically ill, very weak, aaox3 Cardiovascular: irrr Respiratory: diminished at basis, no rales, no rhonchi, no wheezing Abdomen: somewhat distended, some dull discomfort upon palpation, + bowel sounds Extremity: some pitting edema  Discharge Instructions You were cared for by a hospitalist during your hospital stay. If you have any questions about your discharge medications or the care you received while you were in the hospital after you are discharged, you can call the unit and asked to speak with the hospitalist on call if the hospitalist that took care of you is not available. Once you are discharged, your primary care physician will handle any further medical issues. Please note that NO REFILLS for any discharge medications will be authorized once you are discharged, as it is imperative that you return  to your primary care physician (or establish a relationship with a primary care physician if you do not have one) for your aftercare needs so that they can reassess your need for medications and monitor your lab values.  Discharge Instructions    Discharge instructions    Complete by:  As directed    Diet as tolerated,   Face-to-face encounter (required for Medicare/Medicaid patients)    Complete by:  As directed    I Cindie Rajagopalan certify that this patient is under my care and that I, or a nurse practitioner or physician's assistant working with me, had a face-to-face encounter that meets the physician face-to-face encounter requirements with this patient on 06/23/2016. The encounter with the patient was  in whole, or in part for the following medical condition(s) which is the primary reason for home health care (List medical condition): FTT   The encounter with the patient was in whole, or in part, for the following medical condition, which is the primary reason for home health care:  FTT   I certify that, based on my findings, the following services are medically necessary home health services:   Nursing Physical therapy     Reason for Medically Necessary Home Health Services:  Skilled Nursing- Change/Decline in Patient Status   My clinical findings support the need for the above services:  Shortness of breath with activity   Further, I certify that my clinical findings support that this patient is homebound due to:  Shortness of Breath with activity   For home use only DME oxygen    Complete by:  As directed    Mode or (Route):  Nasal cannula   Liters per Minute:  2   Frequency:  Continuous (stationary and portable oxygen unit needed)   Oxygen delivery system:  Gas   Home Health    Complete by:  As directed    To provide the following care/treatments:   PT OT RN [place] work     Increase activity slowly    Complete by:  As directed      Allergies as of 06/23/2016      Reactions   Aspirin Nausea Only, Other (See Comments)   Makes pt very sick      Medication List    STOP taking these medications   glipiZIDE 5 MG tablet Commonly known as:  GLUCOTROL   lisinopril 5 MG tablet Commonly known as:  PRINIVIL,ZESTRIL     TAKE these medications   acetaminophen 325 MG tablet Commonly known as:  TYLENOL Take 2 tablets (650 mg total) by mouth every 6 (six) hours as needed for moderate pain.   apixaban 5 MG Tabs tablet Commonly known as:  ELIQUIS Take 5 mg by mouth 2 (two) times daily.   carboxymethylcellulose 0.5 % Soln Commonly known as:  REFRESH PLUS Place 1 drop into both eyes 2 (two) times daily.   feeding supplement (GLUCERNA SHAKE) Liqd Take 237 mLs by  mouth daily.   gabapentin 100 MG capsule Commonly known as:  NEURONTIN Take 300 mg by mouth at bedtime.   latanoprost 0.005 % ophthalmic solution Commonly known as:  XALATAN Place 1 drop into both eyes at bedtime.   metoprolol 50 MG tablet Commonly known as:  LOPRESSOR Take 1 tablet (50 mg total) by mouth 2 (two) times daily.   multivitamin with minerals Tabs tablet Take 1 tablet by mouth daily.   pantoprazole 40 MG tablet Commonly known as:  PROTONIX Take 40 mg by  mouth daily.   promethazine 25 MG tablet Commonly known as:  PHENERGAN Take 12.5 mg by mouth every 4 (four) hours as needed for nausea or vomiting.   protein supplement shake Liqd Commonly known as:  PREMIER PROTEIN Take 325 mLs (11 oz total) by mouth 2 (two) times daily between meals.   simethicone 80 MG chewable tablet Commonly known as:  MYLICON Chew 80 mg by mouth every 6 (six) hours as needed for flatulence.   sodium bicarbonate 650 MG tablet Take 1 tablet (650 mg total) by mouth daily.   sucralfate 1 GM/10ML suspension Commonly known as:  CARAFATE Take 1 g by mouth 2 (two) times daily.   vancomycin 125 MG capsule Commonly known as:  VANCOCIN HCL Oral vancomycin 125mg  4times a day x12days, then 125mg  2times a day for 7days, then 125mg  once daily for 7 days, then 125mg  once every other day for 7days, then 125mg  every three days for 14days, then stop.            Durable Medical Equipment        Start     Ordered   06/23/16 0000  For home use only DME oxygen    Question Answer Comment  Mode or (Route) Nasal cannula   Liters per Minute 2   Frequency Continuous (stationary and portable oxygen unit needed)   Oxygen delivery system Gas      06/23/16 1315     Allergies  Allergen Reactions  . Aspirin Nausea Only and Other (See Comments)    Makes pt very sick   Follow-up Information    Truitt Merle, MD Follow up in 3 week(s).   Specialties:  Hematology, Oncology Contact information: [place] [address] [phone]        Cleophas Dunker, MD Follow up on 06/24/2016.   Specialty:  Nurse Practitioner Why:  hospital discharge follow up Contact information: [address] [place] [address] [phone]        Kyung Rudd, MD Follow up on 06/25/2016.   Specialty:  Radiation Oncology Contact information: [address] [place] [address] [phone]            The results of significant diagnostics from this hospitalization (including imaging, microbiology, ancillary and laboratory) are listed below for reference.    Significant Diagnostic Studies: Dg Chest 2 View  Result Date: 06/02/2016 CLINICAL DATA:  Pancreatic cancer, shortness of breath for 2 weeks EXAM: CHEST  2 VIEW COMPARISON:  05/09/2016 FINDINGS: Cardiomediastinal silhouette is stable. No pulmonary edema. There is small left pleural effusion left basilar atelectasis or infiltrate. Mild degenerative changes lower thoracic and lumbar spine. IMPRESSION: No pulmonary edema. Small left pleural effusion with left basilar atelectasis or infiltrate. Electronically Signed   By: Lahoma Crocker M.D.   On: 06/02/2016 13:51   Ct Angio Chest Pe W And/or Wo Contrast  Result Date: 06/02/2016 CLINICAL DATA:  Shortness of breath, possible PE EXAM: CT ANGIOGRAPHY CHEST WITH CONTRAST TECHNIQUE: Multidetector CT imaging of the chest was performed using the standard protocol during bolus administration of intravenous contrast. Multiplanar CT image reconstructions and MIPs were obtained to evaluate the vascular anatomy. CONTRAST:  80 cc Isovue COMPARISON:  None FINDINGS: Cardiovascular: Cardiomegaly is noted. Atherosclerotic calcifications of thoracic aorta. Extensive atherosclerotic calcifications of coronary arteries. The study is of excellent technical quality. No pulmonary embolus is noted. Mediastinum/Nodes: There is a calcified right hilar lymph node axial image 49 measures 6.6 mm probable  prior granulomatous disease.  Multiple mediastinal calcified lymph nodes are noted probable prior granulomatous disease. Bilateral hilar lymph nodes calcified. Central airways are patent. Lungs/Pleura: There is bilateral small pleural effusion. Bilateral lower lobe bronchitic changes are noted. Mild bronchitic changes are noted right middle lobe. There is small atelectasis or infiltrate in left lower lobe posteriorly retrocardiac. No pulmonary edema is noted. Upper Abdomen: The visualized upper abdomen shows extensive atherosclerotic calcifications of splenic artery. Again noted is irregular soft tissue density in splenic hilum suspicious for pancreatic malignancy. Musculoskeletal: No destructive bony lesions are noted. Osteopenia and degenerative changes thoracic spine. Review of the MIP images confirms the above findings. IMPRESSION: 1. No pulmonary embolus is noted. 2. Extensive atherosclerotic calcifications of coronary arteries. Cardiomegaly noted. 3. Bilateral small pleural effusion. Bilateral lower lobe bronchitic changes. Mild bronchitic changes right middle lobe. There is small atelectasis or infiltrate in left lower lobe posterior medially retrocardiac. 4. Bilateral hilar and mediastinal calcified lymph nodes probable prior granulomatous disease. 5. No pulmonary edema. 6. Degenerative changes thoracic spine. Electronically Signed   By: Lahoma Crocker M.D.   On: 06/02/2016 18:51    Microbiology: No results found for this or any previous visit (from the past 240 hour(s)).   Labs: Basic Metabolic Panel:  Recent Labs Lab 06/20/16 2000 06/21/16 0518 06/22/16 0530 06/23/16 0537  NA 126* 130* 130* 133*  K 4.9 4.3 4.8 4.6  CL 96* 100* 103 106  CO2 20* 21* 22 21*  GLUCOSE 101* 85 128* 128*  BUN 25* 22* 16 13  CREATININE 1.05 0.98 0.90 0.66  CALCIUM 8.9 8.9 8.5* 8.5*  MG  --   --  1.4* 1.7   Liver Function Tests:  Recent Labs Lab 06/20/16 2000  AST 24  ALT 11*  ALKPHOS 73  BILITOT 0.7    PROT 6.8  ALBUMIN 3.6   No results for input(s): LIPASE, AMYLASE in the last 168 hours. No results for input(s): AMMONIA in the last 168 hours. CBC:  Recent Labs Lab 06/20/16 2000 06/21/16 0518  WBC 8.2 7.5  NEUTROABS 4.8  --   HGB 12.3* 11.8*  HCT 36.1* 35.6*  MCV 84.7 86.2  PLT 260 228   Cardiac Enzymes: No results for input(s): CKTOTAL, CKMB, CKMBINDEX, TROPONINI in the last 168 hours. BNP: BNP (last 3 results)  Recent Labs  06/02/16 1412  BNP 1,005.6*    ProBNP (last 3 results) No results for input(s): PROBNP in the last 8760 hours.  CBG:  Recent Labs Lab 06/22/16 1115 06/22/16 1641 06/22/16 2135 06/23/16 0742 06/23/16 1148  GLUCAP 114* 167* 193* 105* 155*       Signed:  Makaiya Geerdes MD, PhD  [facility] 06/23/2016, 1:32 PM

## 2016-06-23 NOTE — Progress Notes (Signed)
PMT progress note  Patient seen before discharge, resting in his bed, appears weak Wife and daughter at bedside  We reviewed patient's overall condition, I re discussed DNR DNI with them. Family wishes to continue to think about the decision. Patient understands CPR and resuscitative attempts but wishes not to make the decision himself, wants his family to make that decision for him.   BP 115/66 (BP Location: Right Arm)   Pulse 90   Temp 98 F (36.7 C) (Oral)   Resp (!) 163   Ht 5\' 6"  (1.676 m)   Wt 71.7 kg (158 lb 1.1 oz)   SpO2 (!) 10%   BMI 25.51 kg/m  Weak pale S1 S2 Clear Abdomen is some what distended No edema Awake alert  Life limiting illness: Pancreatic cancer C diff Ongoing fxnl decline  Discussions: We discussed in detail about the patient's overall condition.  Family requesting for supplemental O2 on d/c, discussed with Dr Erlinda Hong.  Family will consider hospice option soon, they wish to find out more from his upcoming rad onc appointment Symptom management discussions regarding bowel regimen, pain medications, nausea management etc discussed in detail Advised family against force feeding the patient, discussed about cancer related cachexia and anorexia in detail. Offered supportive care and active listening  25 minutes spent Loistine Chance MD [facility] health palliative medicine team [phone]

## 2016-06-24 ENCOUNTER — Ambulatory Visit: Payer: PPO

## 2016-06-24 ENCOUNTER — Ambulatory Visit
Admission: RE | Admit: 2016-06-24 | Discharge: 2016-06-24 | Disposition: A | Discharge status: Home / Self Care | Payer: PPO | Source: Ambulatory Visit | Attending: Radiation Oncology | Admitting: Radiation Oncology

## 2016-06-24 ENCOUNTER — Other Ambulatory Visit: Payer: Self-pay | Admitting: *Deleted

## 2016-06-24 ENCOUNTER — Ambulatory Visit: Payer: Self-pay | Admitting: *Deleted

## 2016-06-24 DIAGNOSIS — R188 Other ascites: Secondary | ICD-10-CM | POA: Insufficient documentation

## 2016-06-24 DIAGNOSIS — K861 Other chronic pancreatitis: Secondary | ICD-10-CM | POA: Insufficient documentation

## 2016-06-24 DIAGNOSIS — C259 Malignant neoplasm of pancreas, unspecified: Secondary | ICD-10-CM | POA: Insufficient documentation

## 2016-06-24 DIAGNOSIS — K769 Liver disease, unspecified: Secondary | ICD-10-CM | POA: Insufficient documentation

## 2016-06-24 DIAGNOSIS — J90 Pleural effusion, not elsewhere classified: Secondary | ICD-10-CM | POA: Insufficient documentation

## 2016-06-24 DIAGNOSIS — J9811 Atelectasis: Secondary | ICD-10-CM | POA: Insufficient documentation

## 2016-06-24 NOTE — Progress Notes (Signed)
GI Location of Tumor / Histology: Pancreatic cancer  Benjamin Hurley presented  months ago with symptoms of: abdominal pain left upper quadrant  Biopsies of (if applicable) revealed: Diagnosis 05/27/2016 FINE NEEDLE ASPIRATION, ENDOSCOPIC, LEFT UPPER QUADRANT MASS (SPECIMEN 1 OF 1 COLLECTED 05/27/16): MALIGNANT CELLS CONSISTENT WITH ADENOCARCINOMA  Past/Anticipated interventions by surgeon, if any: Dr. Rudene Anda 05/27/16 Upper EUS and biopsy  Colonoscopy with bx  Dr. Kennedy Bucker, MD  Diagnosis 05/13/16 1. Colon, polyp(s), transverse - TUBULAR ADENOMA(S). - HIGH GRADE DYSPLASIA IS NOT IDENTIFIED. 2. Colon, biopsy, mass - BENIGN POLYPOID COLORECTAL MUCOSA WITH A LYMPHOID AGGREGATE. - THERE IS NO EVIDENCE OF MALIGNANCY Transurethral resection of Prostate  Past/Anticipated interventions by medical oncology, if any: Dr. Burr Medico appt 06/07/16  Weight changes, if any: loss and gained  Bowel/Bladder complaints, if any: constipation and loose stools off and on, positive C-Diff 06/20/16 sent home with antibiotics  Nausea / Vomiting, if any: nausea, On liquid diet, poor oral intake, takes phenergan prn for nausea  Pain issues, if any:  Abdominal pain 3 years,  Goes to the [place] , [place],  Pain 8/9 abdomen pain at present only takes tylenol prn  Any blood per rectum:   NO  SAFETY ISSUES:  Left sided weakness, stroke, weakness, uses w/c,  Walks with walker with Physical therapist   Prior radiation? NO  Pacemaker/ICD? NO  Is the patient on methotrexate? NO  Current Complaints/Details:, Married, hx C-diff 06/20/16 and uncontrolled A-Fib, HX Stroke  Before back surgery and after.BPH, Chronic CHF,DM type 2,HTN,HC carotid endarectomy,  D/c home 06/20/16 ,Dr. Florencia Reasons note dicussed poor prognosis ,hospice discussed,family indecisive Mother Cancer  Allergies:Aspirin-nausea  BP 127/86 (BP Location: Left Arm, Patient Position: Sitting, Cuff Size: Normal)   Pulse 100   Temp 97.6 F (36.4 C) (Oral)   Resp 20    Ht 5\' 6"  (1.676 m)   Wt 154 lb (69.9 kg)   SpO2 100%   BMI 24.86 kg/m   Wt Readings from Last 3 Encounters:  06/25/16 154 lb (69.9 kg)  06/23/16 158 lb 1.1 oz (71.7 kg)  06/18/16 139 lb (63 kg)

## 2016-06-24 NOTE — Patient Outreach (Signed)
Pt hospitalized for complications C-difficile, diarrhea, dehydration  5/3-06/23/16- palliative care consult and goals of care completed while in hospital.  Telephone call to daughter Misha Antonini for transition of care week 1, spoke with daughter, HIPAA verified, [name] states she and her daughter are assisting pt,  Pt to see Dr. Lisbeth Renshaw (oncology) tomorrow and daughter will call and make primary MD post hospital appointment, pt will also see oncology nutritionist tomorrow and continues with full liquid diet, drinking glucerna and premier protein shakes,eating sodium (daughter states "they told us not to worry about the salt since his levels have been so low")  Per daughter, pt does not have diarrhea and is taking oral vancomycin, has all medications and taking as prescribed, pt is weighing daily and weight today 150 pounds,  CBG "has been good" per daughter. Family still not willing to discuss hospice at present, pt will start radiation and family will see how this aspect of care goes.   [facility] CM Care Plan Problem One     Most Recent Value  Care Plan Problem One  Knowledge deficit related to CHF, GI issues  Role Documenting the Problem One  Care Management Coordinator  Care Plan for Problem One  Active  [facility] Long Term Goal (31-90 days)  Pt will verbalize better understanding of disease processes (CHF, GI issues) to avoid hospital readmission within 60 days.  [facility]N Long Term Goal Start Date  06/14/16  Interventions for Problem One Long Term Goal  RN CM reviewed discharge instructions with daughter, reviewed full liquid diet.  [facility] CM Short Term Goal #1 (0-30 days)  knowledge deficit related to CHF zones/ action plan  [facility] CM Short Term Goal #1 Start Date  06/18/16  Interventions for Short Term Goal #1  RN CM reinforced CHF action plan with patient and wife, daughter, reviewed importance of daily weights and recording in [facility] calendar  [facility] CM Short Term Goal #2 (0-30 days)  pt will attend all upcoming GI related  appointments, complete needed tests within 30 days.  [facility] CM Short Term Goal #2 Start Date  06/14/16  Interventions for Short Term Goal #2  RN CM reviewed all upcoming appointments with primary MD, GI and oncology    [facility] CM Care Plan Problem Two     Most Recent Value  Care Plan Problem Two  High risk for falls  Role Documenting the Problem Two  Care Management Coordinator  Care Plan for Problem Two  Active  [facility]N CM Short Term Goal #1 (0-30 days)  Pt and wife will verbalize safety precautions within 30 days  [facility]N CM Short Term Goal #1 Start Date  05/31/16  [facility] CM Short Term Goal #1 Met Date   06/18/16  Interventions for Short Term Goal #2   RN CM reminded for pt to ask for assistance as needed and use walker, wheelchair as needed      PLAN Continue weekly transition of care calls See for home visit this month  [name] [facility], [place] Coordinator [phone]

## 2016-06-25 ENCOUNTER — Ambulatory Visit
Admission: RE | Admit: 2016-06-25 | Discharge: 2016-06-25 | Disposition: A | Discharge status: Home / Self Care | Payer: PPO | Source: Ambulatory Visit | Attending: Radiation Oncology | Admitting: Radiation Oncology

## 2016-06-25 ENCOUNTER — Encounter: Payer: Self-pay | Admitting: Radiation Oncology

## 2016-06-25 VITALS — BP 127/86 | HR 100 | Temp 97.6°F | Resp 20 | Ht 66.0 in | Wt 154.0 lb

## 2016-06-25 DIAGNOSIS — R110 Nausea: Secondary | ICD-10-CM

## 2016-06-25 DIAGNOSIS — G893 Neoplasm related pain (acute) (chronic): Secondary | ICD-10-CM | POA: Diagnosis not present

## 2016-06-25 DIAGNOSIS — J90 Pleural effusion, not elsewhere classified: Secondary | ICD-10-CM | POA: Diagnosis not present

## 2016-06-25 DIAGNOSIS — J9811 Atelectasis: Secondary | ICD-10-CM | POA: Diagnosis not present

## 2016-06-25 DIAGNOSIS — A0472 Enterocolitis due to Clostridium difficile, not specified as recurrent: Secondary | ICD-10-CM | POA: Diagnosis not present

## 2016-06-25 DIAGNOSIS — Z7189 Other specified counseling: Secondary | ICD-10-CM

## 2016-06-25 DIAGNOSIS — C259 Malignant neoplasm of pancreas, unspecified: Secondary | ICD-10-CM

## 2016-06-25 DIAGNOSIS — C250 Malignant neoplasm of head of pancreas: Secondary | ICD-10-CM

## 2016-06-25 DIAGNOSIS — R188 Other ascites: Secondary | ICD-10-CM | POA: Diagnosis not present

## 2016-06-25 DIAGNOSIS — K861 Other chronic pancreatitis: Secondary | ICD-10-CM | POA: Diagnosis not present

## 2016-06-25 DIAGNOSIS — K769 Liver disease, unspecified: Secondary | ICD-10-CM | POA: Diagnosis not present

## 2016-06-25 DIAGNOSIS — E440 Moderate protein-calorie malnutrition: Secondary | ICD-10-CM

## 2016-06-25 DIAGNOSIS — C251 Malignant neoplasm of body of pancreas: Secondary | ICD-10-CM

## 2016-06-25 DIAGNOSIS — R1012 Left upper quadrant pain: Secondary | ICD-10-CM

## 2016-06-25 MED ORDER — OXYCODONE HCL 5 MG PO TABS: 2.5000 mg | ORAL_TABLET | ORAL | 0 refills | Status: DC | PRN

## 2016-06-25 MED ORDER — ONDANSETRON HCL 8 MG PO TABS: 8.0000 mg | ORAL_TABLET | Freq: Three times a day (TID) | ORAL | 3 refills | Status: AC | PRN

## 2016-06-25 NOTE — Progress Notes (Signed)
Radiation Oncology         [phone] ________________________________  Name: Benjamin Hurley MRN: [id number]  Date: 06/25/2016  DOB: [birth year]  HE:NIDPOEU, Vickii Chafe, MD  Truitt Merle, MD     REFERRING PHYSICIAN: Truitt Merle, MD  DIAGNOSIS: The encounter diagnosis was Malignant neoplasm of body of pancreas [facility]).  Stage IA, cT1cN0Mx adenocarcinoma of the pancreas  HISTORY OF PRESENT ILLNESS: Benjamin Hurley is a 83 y.o. male seen at the request of Dr. Burr Medico for a new diagnosis of pancreatic cancer. The patient presented with LUQ abdominal pain and was found to have C. Diff colitis in March 2018, at which time he had been admitted on multiple occasions. A colonoscopy in March revealed a non cancerous polyp. He has been admitted at [facility] in April for a CHF exacerbation. He complained of abdominal pain for almost 3 years, as well as nausea with eating. Ultimately imaging studies including an MRI of the abdomen was performed revealing a mass extending into the splenic hilum from the tail of the pancreas. Left sided colonic thickening was also noted. An endoscopic biopsy on 05/27/16 revealed adenocarcinoma consistent with a pancreatic primary. He had an EUS at that time as well which revealed a 1.8 cm soft tissue mass in the pancreatic tail. No adenopathy was noted, though the study was limited by his calcified blood vessels. He was presented in conference and he saw Dr. Burr Medico on 06/07/16. Given his age, poor performance status, history of CVA following surgery, and need for chronic anticoagulation, he was not felt to be a good surgical candidate. He was offered palliative radiotherapy discussion versus hospice. The patient and his family are interested in an aggressive approach and are also pursuing a second opinion.  Of note, he was also admitted between 06/20/16-06/23/16 due to diarrhea, weakness, and what appears to be failure to thrive. He was found again to have C. Diff colitis. He was started on  oral vancomycin and at the time of discharge he did not have any diarrhea. He comes today with his wife and daughter to discuss options of treatment for his pancreatic cancer.  PREVIOUS RADIATION THERAPY: No   PAST MEDICAL HISTORY:  Past Medical History:  Diagnosis Date  . A-fib ([place])    on Eliquis  . Arthritis    neck  . BPH (benign prostatic hyperplasia)   . Cancer [facility])    pancreatic cancer  . Carotid stenosis   . CHF (congestive heart failure) ([place])   . Diabetes ([place])    type 2  . Essential hypertension   . GERD (gastroesophageal reflux disease)   . Glaucoma    both eyes  . Headache   . Peripheral neuropathy   . Stroke [facility]) 2014   left side weakness       PAST SURGICAL HISTORY: Past Surgical History:  Procedure Laterality Date  . CAROTID ENDARTERECTOMY Bilateral   . COLONOSCOPY WITH PROPOFOL N/A 05/13/2016   Procedure: COLONOSCOPY WITH PROPOFOL;  Surgeon: Ladene Artist, MD;  Location: [facility] ENDOSCOPY;  Service: Endoscopy;  Laterality: N/A;  . EUS N/A 05/27/2016   Procedure: UPPER ENDOSCOPIC ULTRASOUND (EUS) LINEAR;  Surgeon: Milus Banister, MD;  Location: [facility] ENDOSCOPY;  Service: Endoscopy;  Laterality: N/A;  . lumber spine    . TRANSURETHRAL RESECTION OF PROSTATE       FAMILY HISTORY:  Family History  Problem Relation Age of Onset  . Cancer Mother     cancer      SOCIAL HISTORY:  reports that he quit smoking about 50 years ago. His smoking use included Pipe. He quit after 5.00 years of use. He quit smokeless tobacco use about 50 years ago. His smokeless tobacco use included Chew. He reports that he does not drink alcohol or use drugs. The patient is married and lives in [place]. He is accompanied by his daughter and wife. He's retired from working in the [employer].   ALLERGIES: Aspirin   MEDICATIONS:  Current Outpatient Prescriptions  Medication Sig Dispense Refill  . acetaminophen (TYLENOL) 325 MG tablet Take 2 tablets (650 mg total) by mouth  every 6 (six) hours as needed for moderate pain. 20 tablet 0  . apixaban (ELIQUIS) 5 MG TABS tablet Take 5 mg by mouth 2 (two) times daily.    . carboxymethylcellulose (REFRESH PLUS) 0.5 % SOLN Place 1 drop into both eyes 2 (two) times daily.    . feeding supplement, GLUCERNA SHAKE, (GLUCERNA SHAKE) LIQD Take 237 mLs by mouth daily.    Marland Kitchen gabapentin (NEURONTIN) 100 MG capsule Take 300 mg by mouth at bedtime.    Marland Kitchen latanoprost (XALATAN) 0.005 % ophthalmic solution Place 1 drop into both eyes at bedtime.    . metoprolol (LOPRESSOR) 50 MG tablet Take 1 tablet (50 mg total) by mouth 2 (two) times daily. 60 tablet 0  . Multiple Vitamin (MULTIVITAMIN WITH MINERALS) TABS tablet Take 1 tablet by mouth daily. 30 tablet 0  . pantoprazole (PROTONIX) 40 MG tablet Take 40 mg by mouth daily.    . promethazine (PHENERGAN) 25 MG tablet Take 12.5 mg by mouth every 4 (four) hours as needed for nausea or vomiting.    . protein supplement shake (PREMIER PROTEIN) LIQD Take 325 mLs (11 oz total) by mouth 2 (two) times daily between meals. 30 Can 0  . simethicone (MYLICON) 80 MG chewable tablet Chew 80 mg by mouth every 6 (six) hours as needed for flatulence.    . sodium bicarbonate 650 MG tablet Take 1 tablet (650 mg total) by mouth daily. 30 tablet 0  . sucralfate (CARAFATE) 1 GM/10ML suspension Take 1 g by mouth 2 (two) times daily.    . vancomycin (VANCOCIN HCL) 125 MG capsule Oral vancomycin 160m 4times a day x12days, then 1224m2times a day for 7days, then 12571mnce daily for 7 days, then 125m15mce every other day for 7days, then 125mg8mry three days for 14days, then stop. 76 capsule 0   No current facility-administered medications for this encounter.      REVIEW OF SYSTEMS: On review of systems, the patient reports that he is not doing well.  He has weight loss and only recently gained some of his weight back. He has constipation since his hospital discharge on 06/23/16. He has abdominal pain that started 3  years ago and he rates the pain as a 8-9/10. He takes Tylenol PRN for the pain. He has nausea  For which he takes phenergan PRN without much relief. His abdominal pain worsens if he eats solid foods. He is on a liquid diet and drinks Glucerna and Premier Protein. He has left sided weakness due to a stroke 4 years ago. He uses a wheelchair and walks with a walker with physical therapy. He has lower extremity edema from CHF and chronic back pain. A complete review of systems is obtained and is otherwise negative.    PHYSICAL EXAM:  Wt Readings from Last 3 Encounters:  06/23/16 158 lb 1.1 oz (71.7 kg)  06/18/16 139 lb (  63 kg)  06/07/16 143 lb 3.2 oz (65 kg)   Temp Readings from Last 3 Encounters:  06/23/16 98 F (36.7 C) (Oral)  06/07/16 97.8 F (36.6 C) (Oral)  06/05/16 97.8 F (36.6 C) (Oral)   BP Readings from Last 3 Encounters:  06/23/16 115/66  06/18/16 108/60  06/07/16 (!) 109/52   Pulse Readings from Last 3 Encounters:  06/23/16 90  06/18/16 80  06/07/16 87     Pain Assessment Pain Score: 8  Pain Loc: Abdomen/10  In general this is a chronically ill appearing elderly Caucasian male in no acute distress. He is alert and oriented x4 and appropriate throughout the examination. HEENT reveals that the patient is normocephalic, atraumatic. EOMs are intact. PERRLA. Skin is intact without any evidence of gross lesions. His skin is pale, but not specifically jaundiced. Cardiovascular exam reveals a irregular rate and regular rhythm, no clicks rubs or murmurs are auscultated. Chest is clear to auscultation bilaterally. Lymphatic assessment is performed and does not reveal any adenopathy in the cervical, supraclavicular, axillary, or inguinal chains. Abdomen has active bowel sounds in all quadrants and is intact. Point tenderness along the left costophrenic angle in the left upper quadrant is noted but no rebound or guarding is noted . Dependent edema in the left posterior abdominal wall.  Lower extremities are positive for pretibial pitting edema 2+ bilaterally. Lower extremities are otherwise negative for deep calf tenderness, cyanosis, or clubbing.   ECOG = 2  0 - Asymptomatic (Fully active, able to carry on all predisease activities without restriction)  1 - Symptomatic but completely ambulatory (Restricted in physically strenuous activity but ambulatory and able to carry out work of a light or sedentary nature. For example, light housework, office work)  2 - Symptomatic, <50% in bed during the day (Ambulatory and capable of all self care but unable to carry out any work activities. Up and about more than 50% of waking hours)  3 - Symptomatic, >50% in bed, but not bedbound (Capable of only limited self-care, confined to bed or chair 50% or more of waking hours)  4 - Bedbound (Completely disabled. Cannot carry on any self-care. Totally confined to bed or chair)  5 - Death   Eustace Pen MM, Creech RH, Tormey DC, et al. [id number]). "Toxicity and response criteria of the [facility]". [place] Oncol. 5 (6): 649-55    LABORATORY DATA:  Lab Results  Component Value Date   WBC 7.5 06/21/2016   HGB 11.8 (L) 06/21/2016   HCT 35.6 (L) 06/21/2016   MCV 86.2 06/21/2016   PLT 228 06/21/2016   Lab Results  Component Value Date   NA 133 (L) 06/23/2016   K 4.6 06/23/2016   CL 106 06/23/2016   CO2 21 (L) 06/23/2016   Lab Results  Component Value Date   ALT 11 (L) 06/20/2016   AST 24 06/20/2016   ALKPHOS 73 06/20/2016   BILITOT 0.7 06/20/2016      RADIOGRAPHY: Dg Chest 2 View  Result Date: 06/02/2016 CLINICAL DATA:  Pancreatic cancer, shortness of breath for 2 weeks EXAM: CHEST  2 VIEW COMPARISON:  05/09/2016 FINDINGS: Cardiomediastinal silhouette is stable. No pulmonary edema. There is small left pleural effusion left basilar atelectasis or infiltrate. Mild degenerative changes lower thoracic and lumbar spine. IMPRESSION: No pulmonary edema. Small  left pleural effusion with left basilar atelectasis or infiltrate. Electronically Signed   By: Lahoma Crocker M.D.   On: 06/02/2016 13:51   Ct Angio Chest  Pe W And/or Wo Contrast  Result Date: 06/02/2016 CLINICAL DATA:  Shortness of breath, possible PE EXAM: CT ANGIOGRAPHY CHEST WITH CONTRAST TECHNIQUE: Multidetector CT imaging of the chest was performed using the standard protocol during bolus administration of intravenous contrast. Multiplanar CT image reconstructions and MIPs were obtained to evaluate the vascular anatomy. CONTRAST:  80 cc Isovue COMPARISON:  None FINDINGS: Cardiovascular: Cardiomegaly is noted. Atherosclerotic calcifications of thoracic aorta. Extensive atherosclerotic calcifications of coronary arteries. The study is of excellent technical quality. No pulmonary embolus is noted. Mediastinum/Nodes: There is a calcified right hilar lymph node axial image 49 measures 6.6 mm probable prior granulomatous disease. Multiple mediastinal calcified lymph nodes are noted probable prior granulomatous disease. Bilateral hilar lymph nodes calcified. Central airways are patent. Lungs/Pleura: There is bilateral small pleural effusion. Bilateral lower lobe bronchitic changes are noted. Mild bronchitic changes are noted right middle lobe. There is small atelectasis or infiltrate in left lower lobe posteriorly retrocardiac. No pulmonary edema is noted. Upper Abdomen: The visualized upper abdomen shows extensive atherosclerotic calcifications of splenic artery. Again noted is irregular soft tissue density in splenic hilum suspicious for pancreatic malignancy. Musculoskeletal: No destructive bony lesions are noted. Osteopenia and degenerative changes thoracic spine. Review of the MIP images confirms the above findings. IMPRESSION: 1. No pulmonary embolus is noted. 2. Extensive atherosclerotic calcifications of coronary arteries. Cardiomegaly noted. 3. Bilateral small pleural effusion. Bilateral lower lobe  bronchitic changes. Mild bronchitic changes right middle lobe. There is small atelectasis or infiltrate in left lower lobe posterior medially retrocardiac. 4. Bilateral hilar and mediastinal calcified lymph nodes probable prior granulomatous disease. 5. No pulmonary edema. 6. Degenerative changes thoracic spine. Electronically Signed   By: Lahoma Crocker M.D.   On: 06/02/2016 18:51       IMPRESSION/PLAN: 1. Stage IA, cT1cN0Mx adenocarcinoma of the pancreas. The patient met with Dr. Lisbeth Renshaw today and he outlined the pathology results as well as his imaging studies that have been collected to date. He recommends additional imaging with a CT scan of the abdomen with pancreatic protocol to better outline the extent of disease in the abdomen which will also aid in [place] offering radiotherapy. Dr. Lisbeth Renshaw discusses options for palliative radiotherapy versus SBRT. He offers the patient the option to consider a 5 fractions of SBRT vs 10 fraction course of fractionated conventional XRT. We will proceed with CT imaging to better outline his options, and to proceed with simulation this week. We discussed the risks, benefits, short, and long term effects of radiotherapy, and the patient is interested in proceeding. Dr. Lisbeth Renshaw discusses the delivery and logistics of radiotherapy. The patient is in agreement with this. 2. Nausea, likely secondary to #1.The patient is experiencing nausea despite phenergan. We discussed the use of zofran 8 mg q 8 hours prn. He is counseled on the side effect profile of the medication as well. 3. C. diff colitis. He was started on oral vancomycin on 06/23/16 and at the time of discharge from the hospital he did not have any diarrhea. He will complete this course as well. 4. Protein calorie malnutrition. The patient will meet with Ernestene Kiel on Thursday this week. We appreciate her recommendation for nutrition. 5. Abdominal pain secondary to #1. The patient is interested in pain medication at home. He  will be given a prescription for oxycodone 5 mg, and take 1/2 to 1 tablet po q 4-6 hours prn pain. He was counseled on the side effect profile of the medication as well given his age.  6. Goals of care. The patient is interested in enrolling in hospice following his radiotherapy. I will place an order so that he's been evaluated by hospice and they can formally enroll following completion of radiotherapy. We discussed code status as well and he has declared DNR status. Two signed copies are given to the patient to have on hand at home as well as one when out of the house.  The above documentation reflects my direct findings during this shared patient visit. Please see the separate note by Dr. Lisbeth Renshaw on this date for the remainder of the patient's plan of care.    Carola Rhine, PAC  This document serves as a record of services personally performed by Shona Simpson, PA-C and Kyung Rudd, MD. It was created on their behalf by Darcus Austin, a trained medical scribe. The creation of this record is based on the scribe's personal observations and the providers' statements to them. This document has been checked and approved by the attending provider.

## 2016-06-25 NOTE — Progress Notes (Signed)
Please see the Nurse Progress Note in the MD Initial Consult Encounter for this patient. 

## 2016-06-27 ENCOUNTER — Ambulatory Visit: Admission: RE | Admit: 2016-06-27 | Payer: PPO | Source: Ambulatory Visit | Admitting: Radiation Oncology

## 2016-06-27 ENCOUNTER — Ambulatory Visit: Payer: PPO | Admitting: Nutrition

## 2016-06-27 ENCOUNTER — Telehealth: Payer: Self-pay | Admitting: *Deleted

## 2016-06-27 NOTE — Progress Notes (Signed)
83 year old male diagnosed with cancer of the pancreas. He is a patient of Dr. Burr Medico.  Past medical history includes stroke, GERD, essential hypertension, diabetes, congestive heart failure.  Medications include MVI, Protonix, Phenergan, and Carafate.  Labs include sodium 133, glucose 128 on May 6.  Height: 66 inches. Weight: 154 pounds. BMI: 24.86.  I met with patient, his wife and his daughter. Family is very anxious about what patient is allowed to eat. Patient is on a full liquid diet secondary to partial colonic obstruction. He enjoys Glucerna and reports he drinks 2 daily Denies problems with his bowels. Reports nausea continues.  Nutrition diagnosis:  Moderate malnutrition the context of acute illness as evidenced by moderate moderate depletion of body fat,  moderate depletion of muscle mass along with edema in lower extremities.  Intervention: I educated patient and family on full liquid diet and provided fact sheet. Encouraged patient to continue oral nutrition supplements 3 times a day.  Provided some samples. Provided recipes for shakes for smoothies as desired if tolerated. Questions were answered.  Teach back method used.  Contact information was given.  Monitoring, evaluation, goals:  Patient will tolerate full liquid diet to improve quality-of-life and not contribute to further obstruction.  Next visit: No follow-up is scheduled.  Patient will be followed by hospice after radiation therapy is complete.  Family has my contact information for questions.  **Disclaimer: This note was dictated with voice recognition software. Similar sounding words can inadvertently be transcribed and this note may contain transcription errors which may not have been corrected upon publication of note.**

## 2016-06-27 NOTE — Telephone Encounter (Signed)
CALLED PATIENT'S DAUGHTER (SHARON) TO INFORM OF CT FOR 07-04-16- ARRIVAL TIME - 12:45 PM @ [facility], PT. TO BE NPO- 4 HRS. PRIOR TO TEST, LVM FOR A RETURN CALL

## 2016-07-01 ENCOUNTER — Ambulatory Visit: Payer: PPO | Admitting: *Deleted

## 2016-07-01 ENCOUNTER — Other Ambulatory Visit: Payer: Self-pay | Admitting: *Deleted

## 2016-07-01 NOTE — Patient Outreach (Signed)
Telephone call to pt for transition of care week 2, spoke with daughter Lavan Imes, HIPAA verified, limited conversation today due to hospice is at patient's home providing assessment, [name] states " we're still not sure what we're going to do, we're just talking to them and will decide"  Pt saw primary care MD Friday and had blood work/ sodium level checked and waiting on results, pt has all medications and taking as prescribed, pt has not begun radiation yet and will depend on what CT scan this Thursday shows.  Weight is 150 pounds.    PLAN Continue weekly transition of care calls Follow up on hospice services  [name] [facility], [place] Coordinator [phone]

## 2016-07-04 ENCOUNTER — Ambulatory Visit (HOSPITAL_COMMUNITY)
Admission: RE | Admit: 2016-07-04 | Discharge: 2016-07-04 | Disposition: A | Discharge status: Home / Self Care | Payer: PPO | Source: Ambulatory Visit | Attending: Radiation Oncology | Admitting: Radiation Oncology

## 2016-07-04 ENCOUNTER — Ambulatory Visit
Admission: RE | Admit: 2016-07-04 | Discharge: 2016-07-04 | Disposition: A | Discharge status: Home / Self Care | Payer: PPO | Source: Ambulatory Visit | Attending: Radiation Oncology | Admitting: Radiation Oncology

## 2016-07-04 DIAGNOSIS — C252 Malignant neoplasm of tail of pancreas: Secondary | ICD-10-CM | POA: Diagnosis not present

## 2016-07-04 DIAGNOSIS — K861 Other chronic pancreatitis: Secondary | ICD-10-CM | POA: Diagnosis not present

## 2016-07-04 DIAGNOSIS — C259 Malignant neoplasm of pancreas, unspecified: Secondary | ICD-10-CM | POA: Diagnosis not present

## 2016-07-04 DIAGNOSIS — J90 Pleural effusion, not elsewhere classified: Secondary | ICD-10-CM | POA: Diagnosis not present

## 2016-07-04 DIAGNOSIS — K869 Disease of pancreas, unspecified: Secondary | ICD-10-CM | POA: Diagnosis not present

## 2016-07-04 DIAGNOSIS — C250 Malignant neoplasm of head of pancreas: Secondary | ICD-10-CM | POA: Diagnosis not present

## 2016-07-04 DIAGNOSIS — D735 Infarction of spleen: Secondary | ICD-10-CM | POA: Insufficient documentation

## 2016-07-04 MED ORDER — IOPAMIDOL (ISOVUE-300) INJECTION 61%
INTRAVENOUS | Status: AC
  Administered 2016-07-04: 100 mL
  Filled 2016-07-04: qty 100

## 2016-07-05 ENCOUNTER — Other Ambulatory Visit: Payer: Self-pay | Admitting: Licensed Clinical Social Worker

## 2016-07-05 NOTE — Patient Outreach (Signed)
Assessment:  CSW spoke via phone with Frazier Richards. CSW verified identity of Roni Scow. [name] and CSW spoke of client needs. Client sees Dr. Anda Latina as primary care doctor. Client has prescribed medications and is taking medications as prescribed. Client has support from  his spouse and from his daughter. Client has been sleeping adequately. Client uses a Full Liquid diet. RN [name] has spoken with client and family about client's diet and use of Glucerna supplement. CSW and [name] spoke of client care plan. CSW encouraged that client or family representative communicate with CSW in next 30 days to discuss community resources of assistance for client. Client has [employer].Client uses a wheelchair as needed for ambulation assistance.Client desires to remain at home with needed supports in place. Client and family have been meeting with medical providers as needed to address client's treatment needs. Client has been considering radiation treatments to address pancreatic cancer issues of client. Palliative care consult was completed with client at his recent hospitalization. Hospice representative has met with client and family at home of client recently.  Client and family have also met with registered dietician to discuss current diet of client and nutrition needs of client. Eduard Penkala informed CSW on 07/05/16 that client and family had decided for client to begin receiving radiation treatments on 07/17/16. She said client would begin radiation treatments on 07/17/16 and complete radiation treatments on 07/30/16. She said client had been pleased with care received from medical providers. Client has been pleased with care received by client at [facility].  She said client does fatigue occasionally. She said that registered dietician client  and family had met with recently was very helpful. [name] said that client is still on Full Liquid diet.  [name] said that RN  [name] is scheduled to conduct home visit with client on 07/09/16. [name] said client and family were appreciative of [facility] staff support at this time. CSW spoke with [name] about Palliative Care services for client versus Hospice Care services for client.  CSW and [name] spoke of pain management for client.  CSW encouraged [name] to communicate with RN [name] to further discuss pain issues for client.  CSW and [name] spoke of insurance of client and preauthorization from [employer] prior to scheduled client treatments. [name] said that staff at [facility] had been helping client in communicating with [employer] insurance regarding preauthorization for client's treatments.  CSW encouraged client or [name] to call CSW at [phone] as needed to discuss social work needs of client. [name] was appreciative of phone call from [place] on 07/05/16.   Plan:  Client or Venus Ruhe to communicate with CSW in next 30 days to discuss community resources of assistance for client.  CSW to collaborate with RN [name] as needed in monitoring needs of client.  CSW to call client or Milo Schreier in 4 weeks to assess client needs at that time.  [name].Madilyn Cephas MSW, LCSW Licensed Clinical Social Worker [facility] Care Management [phone]

## 2016-07-08 ENCOUNTER — Telehealth: Payer: Self-pay | Admitting: *Deleted

## 2016-07-08 ENCOUNTER — Ambulatory Visit: Payer: Self-pay | Admitting: *Deleted

## 2016-07-08 NOTE — Telephone Encounter (Signed)
Frazier Richards left vm asking results of patient's CT results, wants to know how big the tumor has gotten, she is MPOA for the patient and only wants her to be called these result either Dr. Lisbeth Renshaw or his PA=Alison

## 2016-07-09 ENCOUNTER — Telehealth: Payer: Self-pay | Admitting: *Deleted

## 2016-07-09 ENCOUNTER — Encounter: Payer: Self-pay | Admitting: *Deleted

## 2016-07-09 ENCOUNTER — Other Ambulatory Visit: Payer: Self-pay | Admitting: *Deleted

## 2016-07-09 NOTE — Telephone Encounter (Signed)
Called [name] patient's daughter, to let her know that she will be called tomorrow after conference with Dr's. Of patient's CT results, she thanked this RN for calling 12:16 PM

## 2016-07-09 NOTE — Patient Outreach (Signed)
[name]) Care Management   07/09/2016  Benjamin Hurley 04-Dec-1925 [id number]  Benjamin Hurley is an 83 y.o. male  Subjective: Routine home visit with pt, HIPAA verified, daughter [name] present and states radiation starts next week "everything approved now"  CT scan completed this past Thursday and will get results tomorrow., continues weighing daily with range 150-155 pounds.  Daughter states pt gets bloated at times and she has reported this to primary MD , pt states " I'm going to take miralax today because I haven't had bowel movement in 2- 3 days"  Per daughter, pt continues on 2 liters liquid diet and eating "enough sodium" Sodium level now 134 per daughter, drinking 1-2 glucerna daily.  Pt continues vancomycin for C. Diff, denies diarrhea.  Objective:   Vitals:   07/09/16 1217  BP: 120/60  Pulse: 88  Resp: 18  SpO2: 96%  Weight: 152 lb (68.9 kg)  CBG 150 today  ROS  Physical Exam  Constitutional: He is oriented to person, place, and time. He appears well-developed and well-nourished.  HENT:  Head: Normocephalic.  Neck: Normal range of motion. Neck supple.  Cardiovascular: Normal rate.   Respiratory: Effort normal and breath sounds normal.  GI: Soft. Bowel sounds are normal.  Musculoskeletal: Normal range of motion. He exhibits edema.  1+ edema right lower extremity  Neurological: He is alert and oriented to person, place, and time.  Skin: Skin is warm and dry.  Psychiatric: He has a normal mood and affect. His behavior is normal. Judgment and thought content normal.    Encounter Medications:   Outpatient Encounter Prescriptions as of 07/09/2016  Medication Sig  . acetaminophen (TYLENOL) 325 MG tablet Take 2 tablets (650 mg total) by mouth every 6 (six) hours as needed for moderate pain.  Marland Kitchen apixaban (ELIQUIS) 5 MG TABS tablet Take 5 mg by mouth 2 (two) times daily.  . carboxymethylcellulose (REFRESH PLUS) 0.5 % SOLN Place 1 drop into both eyes 2  (two) times daily.  . feeding supplement, GLUCERNA SHAKE, (GLUCERNA SHAKE) LIQD Take 237 mLs by mouth daily.  . furosemide (LASIX) 40 MG tablet Take 40 mg by mouth daily. Take as needed for dyspnea, edema in feet.  . gabapentin (NEURONTIN) 100 MG capsule Take 300 mg by mouth at bedtime.  Marland Kitchen glipiZIDE (GLUCOTROL) 5 MG tablet Take 5 mg by mouth daily.  Marland Kitchen latanoprost (XALATAN) 0.005 % ophthalmic solution Place 1 drop into both eyes at bedtime.  . metoprolol (LOPRESSOR) 50 MG tablet Take 1 tablet (50 mg total) by mouth 2 (two) times daily.  . Multiple Vitamin (MULTIVITAMIN WITH MINERALS) TABS tablet Take 1 tablet by mouth daily.  . ondansetron (ZOFRAN) 8 MG tablet Take 1 tablet (8 mg total) by mouth every 8 (eight) hours as needed for nausea or vomiting.  Marland Kitchen oxyCODONE (OXY IR/ROXICODONE) 5 MG immediate release tablet Take 0.5-1 tablets (2.5-5 mg total) by mouth every 4 (four) hours as needed for severe pain.  . pantoprazole (PROTONIX) 40 MG tablet Take 40 mg by mouth daily.  . promethazine (PHENERGAN) 25 MG tablet Take 12.5 mg by mouth every 4 (four) hours as needed for nausea or vomiting.  . protein supplement shake (PREMIER PROTEIN) LIQD Take 325 mLs (11 oz total) by mouth 2 (two) times daily between meals.  . simethicone (MYLICON) 80 MG chewable tablet Chew 80 mg by mouth every 6 (six) hours as needed for flatulence.  . sodium bicarbonate 650 MG tablet Take 1 tablet (650 mg  total) by mouth daily.  . sucralfate (CARAFATE) 1 GM/10ML suspension Take 1 g by mouth 2 (two) times daily.  . vancomycin (VANCOCIN HCL) 125 MG capsule Oral vancomycin 190m 4times a day x12days, then 1233m2times a day for 7days, then 125106mnce daily for 7 days, then 125m18mce every other day for 7days, then 125mg39mry three days for 14days, then stop.   No facility-administered encounter medications on file as of 07/09/2016.     Functional Status:   In your present state of health, do you have any difficulty performing the  following activities: 06/21/2016 06/02/2016  Hearing? Y Y  Tempie Donningion? N N  Difficulty concentrating or making decisions? N N  Walking or climbing stairs? Y Y  Dressing or bathing? N N  Doing errands, shopping? Y Y  Preparing Food and eating ? - -  Using the Toilet? - -  In the past six months, have you accidently leaked urine? - -  Do you have problems with loss of bowel control? - -  Managing your Medications? - -  Managing your Finances? - -  Housekeeping or managing your Housekeeping? - -  Some recent data might be hidden    Fall/Depression Screening:    Fall Risk  06/25/2016 05/30/2016 05/21/2016  Falls in the past year? Yes Yes Yes  Number falls in past yr: 1 1 1   Injury with Fall? No No No  Risk for fall due to : History of fall(s);Impaired balance/gait Impaired balance/gait;Impaired mobility Impaired balance/gait;Impaired mobility  Follow up Falls evaluation completed;Falls prevention discussed Falls evaluation completed Falls prevention discussed   PHQ 2/9 Scores 05/30/2016 05/21/2016  PHQ - 2 Score 0 0    Assessment:  Pt to start radiation next week, decided not to utilize hospice services at this time (hospice came to pt home for assessment).  Pt will receive results CT scan tomorrow.  Pt has all medications and taking as prescribed, continues checking CBG and weighing daily.  Daughter continues assisting pt daily. Wife continues overseeing daily weights, medications, etc.  Daughter in touch with primary care MD for any issues such as bloating, nausea.    THN CM Care Plan Problem One     Most Recent Value  Care Plan Problem One  Knowledge deficit related to CHF, GI issues  Role Documenting the Problem One  Care Management Coordinator  Care Plan for Problem One  Active  THN Long Term Goal (31-90 days)  Pt will verbalize better understanding of disease processes (CHF, GI issues) to avoid hospital readmission within 60 days.  THN Long Term Goal Start Date  06/14/16  Interventions for  Problem One Long Term Goal  RN CM reviewed full liquid diet, importance of taking zofran q 8 hours as prescribed for nausea  THN CM Short Term Goal #1 (0-30 days)  knowledge deficit related to CHF zones/ action plan  THN CM Short Term Goal #1 Start Date  06/18/16  Interventions for Short Term Goal #1  RN CM reinforced CHF action plan with patient and wife, daughter, reviewed importance of daily weights and [facility], praised and encouraged for efforts with daily weights and keeping good records  THN CM Short Term Goal #2 (0-30 days)  pt will attend all upcoming GI related appointments, complete needed tests within 30 days.  THN CM Short Term Goal #2 Start Date  06/14/16  [facility] Go[facility]2 Met Date  07/09/16  Interventions for Short Term Goal #2  RNCM  review of upcoming appointment schedule and planning, joint visit with [facility] CSW and VA benefits, processes discussed    THN CM Care Plan Problem Two     Most Recent Value  Care Plan Problem Two  High risk for falls  Role Documenting the Problem Two  Care Management [place] for Problem Two  Active  THN CM Short Term Goal #1 (0-30 days)  Pt and wife will verbalize safety precautions within 30 days  THN CM Short Term Goal #1 Start Date  05/31/16  [facility] CM Short Term Goal #1 Met Date   06/18/16      Plan: continue weekly transition of care calls See for home visit in June  [name] [facility], [place] Coordinator [phone]

## 2016-07-11 ENCOUNTER — Inpatient Hospital Stay (HOSPITAL_COMMUNITY)
Admission: AD | Admit: 2016-07-11 | Discharge: 2016-07-12 | DRG: 436 | Disposition: A | Discharge status: Hospice | Payer: PPO | Source: Other Acute Inpatient Hospital | Attending: Internal Medicine | Admitting: Internal Medicine

## 2016-07-11 ENCOUNTER — Telehealth: Payer: Self-pay | Admitting: Radiation Oncology

## 2016-07-11 ENCOUNTER — Encounter (HOSPITAL_COMMUNITY): Payer: Self-pay

## 2016-07-11 DIAGNOSIS — I110 Hypertensive heart disease with heart failure: Secondary | ICD-10-CM | POA: Diagnosis not present

## 2016-07-11 DIAGNOSIS — H409 Unspecified glaucoma: Secondary | ICD-10-CM | POA: Diagnosis not present

## 2016-07-11 DIAGNOSIS — I480 Paroxysmal atrial fibrillation: Secondary | ICD-10-CM | POA: Diagnosis present

## 2016-07-11 DIAGNOSIS — I509 Heart failure, unspecified: Secondary | ICD-10-CM | POA: Diagnosis not present

## 2016-07-11 DIAGNOSIS — M4692 Unspecified inflammatory spondylopathy, cervical region: Secondary | ICD-10-CM | POA: Diagnosis present

## 2016-07-11 DIAGNOSIS — Z8673 Personal history of transient ischemic attack (TIA), and cerebral infarction without residual deficits: Secondary | ICD-10-CM | POA: Diagnosis not present

## 2016-07-11 DIAGNOSIS — K219 Gastro-esophageal reflux disease without esophagitis: Secondary | ICD-10-CM | POA: Diagnosis not present

## 2016-07-11 DIAGNOSIS — Z7189 Other specified counseling: Secondary | ICD-10-CM | POA: Diagnosis not present

## 2016-07-11 DIAGNOSIS — Z7901 Long term (current) use of anticoagulants: Secondary | ICD-10-CM

## 2016-07-11 DIAGNOSIS — Z7984 Long term (current) use of oral hypoglycemic drugs: Secondary | ICD-10-CM

## 2016-07-11 DIAGNOSIS — Z7289 Other problems related to lifestyle: Secondary | ICD-10-CM | POA: Diagnosis not present

## 2016-07-11 DIAGNOSIS — C259 Malignant neoplasm of pancreas, unspecified: Principal | ICD-10-CM

## 2016-07-11 DIAGNOSIS — I6529 Occlusion and stenosis of unspecified carotid artery: Secondary | ICD-10-CM | POA: Diagnosis not present

## 2016-07-11 DIAGNOSIS — N400 Enlarged prostate without lower urinary tract symptoms: Secondary | ICD-10-CM | POA: Diagnosis present

## 2016-07-11 DIAGNOSIS — Z886 Allergy status to analgesic agent status: Secondary | ICD-10-CM | POA: Diagnosis not present

## 2016-07-11 DIAGNOSIS — I4891 Unspecified atrial fibrillation: Secondary | ICD-10-CM | POA: Diagnosis not present

## 2016-07-11 DIAGNOSIS — R918 Other nonspecific abnormal finding of lung field: Secondary | ICD-10-CM | POA: Diagnosis not present

## 2016-07-11 DIAGNOSIS — Z79899 Other long term (current) drug therapy: Secondary | ICD-10-CM

## 2016-07-11 DIAGNOSIS — C787 Secondary malignant neoplasm of liver and intrahepatic bile duct: Secondary | ICD-10-CM | POA: Diagnosis not present

## 2016-07-11 DIAGNOSIS — Z66 Do not resuscitate: Secondary | ICD-10-CM | POA: Diagnosis present

## 2016-07-11 DIAGNOSIS — Z87891 Personal history of nicotine dependence: Secondary | ICD-10-CM | POA: Diagnosis not present

## 2016-07-11 DIAGNOSIS — Z8619 Personal history of other infectious and parasitic diseases: Secondary | ICD-10-CM | POA: Diagnosis not present

## 2016-07-11 DIAGNOSIS — K5900 Constipation, unspecified: Secondary | ICD-10-CM | POA: Diagnosis present

## 2016-07-11 DIAGNOSIS — K5669 Other partial intestinal obstruction: Secondary | ICD-10-CM | POA: Diagnosis not present

## 2016-07-11 DIAGNOSIS — K56609 Unspecified intestinal obstruction, unspecified as to partial versus complete obstruction: Secondary | ICD-10-CM | POA: Diagnosis not present

## 2016-07-11 DIAGNOSIS — E1142 Type 2 diabetes mellitus with diabetic polyneuropathy: Secondary | ICD-10-CM | POA: Diagnosis not present

## 2016-07-11 DIAGNOSIS — R109 Unspecified abdominal pain: Secondary | ICD-10-CM | POA: Diagnosis not present

## 2016-07-11 DIAGNOSIS — R1110 Vomiting, unspecified: Secondary | ICD-10-CM | POA: Diagnosis present

## 2016-07-11 DIAGNOSIS — C252 Malignant neoplasm of tail of pancreas: Secondary | ICD-10-CM | POA: Diagnosis not present

## 2016-07-11 DIAGNOSIS — K56699 Other intestinal obstruction unspecified as to partial versus complete obstruction: Secondary | ICD-10-CM | POA: Diagnosis not present

## 2016-07-11 DIAGNOSIS — Z515 Encounter for palliative care: Secondary | ICD-10-CM | POA: Diagnosis not present

## 2016-07-11 DIAGNOSIS — K869 Disease of pancreas, unspecified: Secondary | ICD-10-CM | POA: Diagnosis not present

## 2016-07-11 DIAGNOSIS — G629 Polyneuropathy, unspecified: Secondary | ICD-10-CM | POA: Diagnosis present

## 2016-07-11 DIAGNOSIS — I5022 Chronic systolic (congestive) heart failure: Secondary | ICD-10-CM | POA: Diagnosis not present

## 2016-07-11 DIAGNOSIS — R9431 Abnormal electrocardiogram [ECG] [EKG]: Secondary | ICD-10-CM | POA: Diagnosis not present

## 2016-07-11 DIAGNOSIS — E119 Type 2 diabetes mellitus without complications: Secondary | ICD-10-CM | POA: Diagnosis not present

## 2016-07-11 LAB — MRSA PCR SCREENING: MRSA by PCR: NEGATIVE

## 2016-07-11 MED ORDER — MORPHINE SULFATE (PF) 2 MG/ML IV SOLN
1.0000 mg | INTRAVENOUS | Status: DC | PRN
  Administered 2016-07-11 – 2016-07-12 (×2): 1 mg via INTRAVENOUS
  Filled 2016-07-11 (×2): qty 1

## 2016-07-11 MED ORDER — ACETAMINOPHEN 325 MG PO TABS: 650.0000 mg | ORAL_TABLET | Freq: Four times a day (QID) | ORAL | Status: DC | PRN

## 2016-07-11 MED ORDER — SENNA 8.6 MG PO TABS
2.0000 | ORAL_TABLET | Freq: Every day | ORAL | Status: DC
  Administered 2016-07-11: 17.2 mg via ORAL
  Filled 2016-07-11: qty 2

## 2016-07-11 MED ORDER — HALOPERIDOL LACTATE 2 MG/ML PO CONC
0.5000 mg | ORAL | Status: DC | PRN
  Filled 2016-07-11: qty 0.3

## 2016-07-11 MED ORDER — ACETAMINOPHEN 650 MG RE SUPP: 650.0000 mg | Freq: Four times a day (QID) | RECTAL | Status: DC | PRN

## 2016-07-11 MED ORDER — ONDANSETRON HCL 4 MG PO TABS: 4.0000 mg | ORAL_TABLET | Freq: Four times a day (QID) | ORAL | Status: DC | PRN

## 2016-07-11 MED ORDER — POLYETHYLENE GLYCOL 3350 17 G PO PACK
17.0000 g | PACK | Freq: Every day | ORAL | Status: DC
  Administered 2016-07-11: 17 g via ORAL
  Filled 2016-07-11: qty 1

## 2016-07-11 MED ORDER — MORPHINE SULFATE (PF) 2 MG/ML IV SOLN: 2.0000 mg | INTRAVENOUS | Status: DC | PRN

## 2016-07-11 MED ORDER — SODIUM CHLORIDE 0.9 % IV SOLN
INTRAVENOUS | Status: DC
  Administered 2016-07-11 – 2016-07-12 (×2): via INTRAVENOUS

## 2016-07-11 MED ORDER — METOPROLOL TARTRATE 5 MG/5ML IV SOLN: 5.0000 mg | INTRAVENOUS | Status: DC | PRN

## 2016-07-11 MED ORDER — HALOPERIDOL LACTATE 5 MG/ML IJ SOLN: 0.5000 mg | INTRAMUSCULAR | Status: DC | PRN

## 2016-07-11 MED ORDER — OXYCODONE HCL 5 MG PO TABS: 5.0000 mg | ORAL_TABLET | ORAL | Status: DC | PRN

## 2016-07-11 MED ORDER — ONDANSETRON HCL 4 MG/2ML IJ SOLN
4.0000 mg | Freq: Four times a day (QID) | INTRAMUSCULAR | Status: DC | PRN
  Administered 2016-07-11 – 2016-07-12 (×2): 4 mg via INTRAVENOUS
  Filled 2016-07-11 (×2): qty 2

## 2016-07-11 MED ORDER — INSULIN ASPART 100 UNIT/ML ~~LOC~~ SOLN: 0.0000 [IU] | Freq: Three times a day (TID) | SUBCUTANEOUS | Status: DC

## 2016-07-11 MED ORDER — ALBUTEROL SULFATE (2.5 MG/3ML) 0.083% IN NEBU: 2.5000 mg | INHALATION_SOLUTION | RESPIRATORY_TRACT | Status: DC | PRN

## 2016-07-11 MED ORDER — BIOTENE DRY MOUTH MT LIQD: 15.0000 mL | OROMUCOSAL | Status: DC | PRN

## 2016-07-11 MED ORDER — GLYCOPYRROLATE 0.2 MG/ML IJ SOLN
0.2000 mg | INTRAMUSCULAR | Status: DC | PRN
  Filled 2016-07-11: qty 1

## 2016-07-11 MED ORDER — ACETAMINOPHEN 325 MG PO TABS
650.0000 mg | ORAL_TABLET | Freq: Four times a day (QID) | ORAL | Status: DC | PRN
  Administered 2016-07-12: 650 mg via ORAL
  Filled 2016-07-11: qty 2

## 2016-07-11 MED ORDER — GLYCOPYRROLATE 1 MG PO TABS
1.0000 mg | ORAL_TABLET | ORAL | Status: DC | PRN
  Filled 2016-07-11: qty 1

## 2016-07-11 MED ORDER — POLYVINYL ALCOHOL 1.4 % OP SOLN: 1.0000 [drp] | Freq: Four times a day (QID) | OPHTHALMIC | Status: DC | PRN

## 2016-07-11 MED ORDER — HALOPERIDOL 0.5 MG PO TABS
0.5000 mg | ORAL_TABLET | ORAL | Status: DC | PRN
  Filled 2016-07-11: qty 1

## 2016-07-11 MED ORDER — SODIUM CHLORIDE 0.9% FLUSH: 3.0000 mL | Freq: Two times a day (BID) | INTRAVENOUS | Status: DC

## 2016-07-11 MED ORDER — METOPROLOL TARTRATE 5 MG/5ML IV SOLN: 5.0000 mg | Freq: Four times a day (QID) | INTRAVENOUS | Status: DC

## 2016-07-11 NOTE — Progress Notes (Signed)
Report called to receiving nurse. VSS.

## 2016-07-11 NOTE — H&P (Signed)
HISTORY AND PHYSICAL       PATIENT DETAILS Name: Benjamin Hurley Age: 83 y.o. Sex: male Date of Birth: 08-29-1925 Admit Date: 07/11/2016 EPP:IRJJOAC, Vickii Chafe, MD   Patient coming from: Home   CHIEF COMPLAINT:  Worsening abdominal pain, nausea, vomiting, abdominal distention for the past 3-4 days  HPI: Benjamin Hurley is a 83 y.o. male with medical history significant of pancreatic cancer invading into the colon causing a chronic partial obstruction for which she is on a liquid diet for the past few weeks, he recently was admitted to the hospital for C. difficile colitis, he also has a  history of chronic systolic heart, atrial fibrillation and is on chronic anticoagulation-he presented to [facility] with worsening abdominal pain, bloating, nausea and a few episodes of vomiting. Per family, patient has had pain and nausea ongoing for the past few weeks, but these have dramatically worsened over the past few days. Patient had a few episodes of nonbloody nonbilious vomiting. Pain is mostly in the mid abdominal area, per patient it is sharp, without any radiation, 10/10 at its intensity, with no particular aggravating or relieving factors. Per family, hospice has already been involved-and they have prescribed him narcotics which the family has not yet filled. Family has tried to control the pain with just Tylenol.  Per patient, he has not had a bowel movement and close to a week, family has tried numerous over-the-counter remedies like prune juice and MiraLAX without any success.  While at [facility], he was found to have abdominal distention-likely secondary to worsening of his chronic small bowel obstruction. CT scan also showed liver metastases which are new.  NG tube was inserted, but due to discomfort, patient pulled it out. He apparently has had no vomiting today, but abdomen continues to be distended.  He was subsequently transferred to [facility] for  multidisciplinary care including hospitalist, oncology, possible general surgery and palliative evaluation.  During my evaluation, he appeared very drowsy due to numerous doses of morphine that he had received. But is able to answer most of my questions. Family later arrived and provided more history.   REVIEW OF SYSTEMS:  Constitutional:   No night sweat  HEENT:    No headaches, Dysphagia,Tooth/dental problems,Sore throat  Cardio-vascular: No chest pain,Orthopnea, PND  GI:  No diarrhea, melena or hematochezia  Resp: No shortness of breath, cough, hemoptysis,plueritic chest pain.   Skin:  No rash or lesions.  GU:    No flank pain.  Musculoskeletal: No joint pain or swelling.   Endocrine: No heat intolerance, no cold intolerance, no polyuria, no polydipsia  Psych: No memory loss.   ALLERGIES:   Allergies  Allergen Reactions  . Aspirin Nausea Only and Other (See Comments)    Makes pt very sick    PAST MEDICAL HISTORY: Past Medical History:  Diagnosis Date  . A-fib ([place])    on Eliquis  . Arthritis    neck  . BPH (benign prostatic hyperplasia)   . Cancer [facility])    pancreatic cancer  . Carotid stenosis   . CHF (congestive heart failure) ([place])   . Diabetes ([place])    type 2  . Essential hypertension   . GERD (gastroesophageal reflux disease)   . Glaucoma    both eyes  . Headache   . Peripheral neuropathy   . Stroke [facility]) 2014   left side weakness    PAST SURGICAL HISTORY: Past Surgical History:  Procedure  Laterality Date  . CAROTID ENDARTERECTOMY Bilateral   . COLONOSCOPY WITH PROPOFOL N/A 05/13/2016   Procedure: COLONOSCOPY WITH PROPOFOL;  Surgeon: Ladene Artist, MD;  Location: [facility] ENDOSCOPY;  Service: Endoscopy;  Laterality: N/A;  . EUS N/A 05/27/2016   Procedure: UPPER ENDOSCOPIC ULTRASOUND (EUS) LINEAR;  Surgeon: Milus Banister, MD;  Location: [facility] ENDOSCOPY;  Service: Endoscopy;  Laterality: N/A;  . lumber spine    . TRANSURETHRAL RESECTION OF  PROSTATE      MEDICATIONS AT HOME: Prior to Admission medications   Medication Sig Start Date End Date Taking? Authorizing Provider  acetaminophen (TYLENOL) 325 MG tablet Take 2 tablets (650 mg total) by mouth every 6 (six) hours as needed for moderate pain. 05/17/16   Arrien, Jimmy Picket, MD  apixaban (ELIQUIS) 5 MG TABS tablet Take 5 mg by mouth 2 (two) times daily.    [provider]  carboxymethylcellulose (REFRESH PLUS) 0.5 % SOLN Place 1 drop into both eyes 2 (two) times daily.    [provider]  feeding supplement, GLUCERNA SHAKE, (GLUCERNA SHAKE) LIQD Take 237 mLs by mouth daily.    [provider]  furosemide (LASIX) 40 MG tablet Take 40 mg by mouth daily. Take as needed for dyspnea, edema in feet.    [provider]  gabapentin (NEURONTIN) 100 MG capsule Take 300 mg by mouth at bedtime. 03/13/16   [provider]  glipiZIDE (GLUCOTROL) 5 MG tablet Take 5 mg by mouth daily. 06/05/16   [provider]  latanoprost (XALATAN) 0.005 % ophthalmic solution Place 1 drop into both eyes at bedtime.    [provider]  metoprolol (LOPRESSOR) 50 MG tablet Take 1 tablet (50 mg total) by mouth 2 (two) times daily. 05/17/16   Arrien, Jimmy Picket, MD  Multiple Vitamin (MULTIVITAMIN WITH MINERALS) TABS tablet Take 1 tablet by mouth daily. 05/17/16   Arrien, Jimmy Picket, MD  ondansetron (ZOFRAN) 8 MG tablet Take 1 tablet (8 mg total) by mouth every 8 (eight) hours as needed for nausea or vomiting. 06/25/16   Hayden Pedro, PA-C  oxyCODONE (OXY IR/ROXICODONE) 5 MG immediate release tablet Take 0.5-1 tablets (2.5-5 mg total) by mouth every 4 (four) hours as needed for severe pain. 06/25/16   Hayden Pedro, PA-C  pantoprazole (PROTONIX) 40 MG tablet Take 40 mg by mouth daily.    [provider]  promethazine (PHENERGAN) 25 MG tablet Take 12.5 mg by mouth every 4 (four) hours as needed for nausea or vomiting.     [provider]  protein supplement shake (PREMIER PROTEIN) LIQD Take 325 mLs (11 oz total) by mouth 2 (two) times daily between meals. 06/23/16   Florencia Reasons, MD  simethicone (MYLICON) 80 MG chewable tablet Chew 80 mg by mouth every 6 (six) hours as needed for flatulence.    [provider]  sodium bicarbonate 650 MG tablet Take 1 tablet (650 mg total) by mouth daily. 06/23/16   Florencia Reasons, MD  sucralfate (CARAFATE) 1 GM/10ML suspension Take 1 g by mouth 2 (two) times daily.    [provider]  vancomycin (VANCOCIN HCL) 125 MG capsule Oral vancomycin 125mg  4times a day x12days, then 125mg  2times a day for 7days, then 125mg  once daily for 7 days, then 125mg  once every other day for 7days, then 125mg  every three days for 14days, then stop. 06/23/16   Florencia Reasons, MD    FAMILY HISTORY: Family History  Problem Relation Age of Onset  . Cancer Mother  cancer     SOCIAL HISTORY:  reports that he quit smoking about 50 years ago. His smoking use included Pipe. He quit after 5.00 years of use. He quit smokeless tobacco use about 50 years ago. His smokeless tobacco use included Chew. He reports that he does not drink alcohol or use drugs.  PHYSICAL EXAM: Blood pressure 117/68, pulse (!) 116, temperature 97.3 F (36.3 C), temperature source Oral, resp. rate 16, height 5\' 6"  (1.676 m), weight 68.9 kg (151 lb 14.4 oz), SpO2 94 %.  General appearance:Lethargic but easily arouses. Appears to be chronically sick appearing and very frail. Atrophy of bitemporal area, his arms and legs appeared to be much thinner than his torso.  Eyes:, pupils equally reactive to light and accomodation HEENT: Atraumatic and Normocephalic Neck: supple, no JVD. No cervical lymphadenopathy.  Resp:Good air entry bilaterally, no added sounds  CVS: S1 S2 irregular and tachycardic GI: Bowel sounds are very sluggish, abdomen is diffusely distended. He has mild but diffuse tenderness all over. No peritoneal  signs Extremities: B/L Lower Ext shows + edema, both legs are warm to touch Neurology: Moves all 4 extremities-but his generalized weakness Psychiatric: Normal judgment and insight. Alert and oriented x 3.  Musculoskeletal:No digital cyanosis Skin:No Rash, warm and dry Wounds:N/A  LABS ON ADMISSION:  I have personally reviewed following labs and imaging studies  CBC: No results for input(s): WBC, NEUTROABS, HGB, HCT, MCV, PLT in the last 168 hours.  Basic Metabolic Panel: No results for input(s): NA, K, CL, CO2, GLUCOSE, BUN, CREATININE, CALCIUM, MG, PHOS in the last 168 hours.  GFR: Estimated Creatinine Clearance: 57.6 mL/min (by C-G formula based on SCr of 0.66 mg/dL).  Liver Function Tests: No results for input(s): AST, ALT, ALKPHOS, BILITOT, PROT, ALBUMIN in the last 168 hours. No results for input(s): LIPASE, AMYLASE in the last 168 hours. No results for input(s): AMMONIA in the last 168 hours.  Coagulation Profile: No results for input(s): INR, PROTIME in the last 168 hours.  Cardiac Enzymes: No results for input(s): CKTOTAL, CKMB, CKMBINDEX, TROPONINI in the last 168 hours.  BNP (last 3 results) No results for input(s): PROBNP in the last 8760 hours.  HbA1C: No results for input(s): HGBA1C in the last 72 hours.  CBG: No results for input(s): GLUCAP in the last 168 hours.  Lipid Profile: No results for input(s): CHOL, HDL, LDLCALC, TRIG, CHOLHDL, LDLDIRECT in the last 72 hours.  Thyroid Function Tests: No results for input(s): TSH, T4TOTAL, FREET4, T3FREE, THYROIDAB in the last 72 hours.  Anemia Panel: No results for input(s): VITAMINB12, FOLATE, FERRITIN, TIBC, IRON, RETICCTPCT in the last 72 hours.  Urine analysis:    Component Value Date/Time   COLORURINE YELLOW 06/21/2016 1157   APPEARANCEUR CLEAR 06/21/2016 1157   LABSPEC 1.006 06/21/2016 1157   PHURINE 5.0 06/21/2016 1157   GLUCOSEU NEGATIVE 06/21/2016 1157   HGBUR NEGATIVE 06/21/2016 1157    BILIRUBINUR NEGATIVE 06/21/2016 1157   KETONESUR NEGATIVE 06/21/2016 1157   PROTEINUR NEGATIVE 06/21/2016 1157   NITRITE NEGATIVE 06/21/2016 1157   LEUKOCYTESUR NEGATIVE 06/21/2016 1157    Sepsis Labs: Lactic Acid, Venous    Component Value Date/Time   LATICACIDVEN 1.70 06/20/2016 2013     Microbiology: No results found for this or any previous visit (from the past 240 hour(s)).    RADIOLOGIC STUDIES ON ADMISSION: No results found.  I have personally reviewed images of CT of the abdomen-shows significant bowel distention    ASSESSMENT AND PLAN: Worsening bowel obstruction  due to pancreatic cancer eroding into the colon: Patient removed  NG tube and does not wish another one to be reinserted, he is clearly in some discomfort due to abdominal distention. Although general surgery was consulted, I have canceled the consultation as after extensive discussion with family, we have decided to forego aggressive care and focus mostly on hospice/comfort care. If able to tolerate clear liquids- continue clear liquids, continue as needed antiemetics, start as needed morphine for pain.  Pancreatic cancer with new liver metastases-known splenic and colon extension: See above regarding worsening bowel obstruction. Case was discussed with patient's primary oncologist Dr. Erie Noe is recommending that we transition to comfort/hospice care. Subsequently I spent an extensive amount of time discussing with the spouse and the patient's daughter at bedside, explained that the cancer was the primary driver of his numerous medical issues and likely causing worsening bowel obstruction, nausea, pain. Explained that oncology is recommending transitioning to comfort measures, explained that he is a very poor candidate for aggressive care given his overall frailty, extensive cancer burden and chronic systolic heart failure. After extensive discussion, family is agreeable to transition to hospice/comfort care. I have  also consulted the palliative care team to further delineate goals of care and to help in discharge disposition. I have started the patient on as needed morphine and other comfort medications.  A. fib with RVR: Rate appears to be in the low 100s-we will try and use as needed IV metoprolol for comfort measures. I see no role for anticoagulation-I have explained to the family that since we have transitioned to hospice/comfort care-we will try and minimize medications as much as we can-especially when he has significant amount of nausea and vomiting. I therefore will discontinue Eliquis  Constipation: Per family patient has not had a bowel movement for close to a week-I suspect this is probably due to mechanical obstruction from underlying malignancy. Given significant bowel dilatation-avoid enema for now. Will place patient on oral laxatives and follow. Note he is not on any narcotics at home at this time  Chronic systolic heart failure: He does have some mild lower extremity edema but otherwise his volume status appears stable. No role for additional medications including diuretics in the setting of comfort measures. If he develops pulmonary edema, and then could consider diuretics as a comfort measure.  Recent history of C. difficile colitis: Has not had a bowel movement in close to a week-has been transitioned to comfort measures-no role to continue oral vancomycin any further.  Goals of care: DO NOT RESUSCITATE-very poor overall prognoses-as noted above-after extensive discussion with spouse and daughter at bedside-we have decided to mostly focus on comfort. Have consulted palliative care, patient's primary oncologist to see patient later this evening. Family indicating that they would rather go home with hospice care at this time-per family they have already been established with rocking him hospice.  Above noted plan was discussed with patient/familyface to face at bedside, they were in agreement.    CONSULTS: None   DVT Prophylaxis: None   Code Status: DNR  Disposition Plan:  Discharge back home with hospice care in the next few days.If he deteriorates, may need residential hospice   Admission status:  Inpatient  going to medical floor  The medical decision making on this patient was of high complexity and the patient is at high risk for clinical deterioration, therefore this is a level 3 visit.   Total time spent  55 minutes.Greater than 50% of this time was spent in  counseling, explanation of diagnosis, planning of further management, and coordination of care.  Oren Binet [facility] Pager [phone]  If 7PM-7AM, please contact night-coverage [email or web address] Password [email or web address] 07/11/2016, 1:57 PM

## 2016-07-11 NOTE — Progress Notes (Signed)
Benjamin Hurley   DOB:1925/06/20   JS#:[id number]   YIF#:[id number]  Oncology follow up note   Subjective:  Pt was admitted for bowel obstruction this morning. He was drowsy but comfortable when I saw him. I spoke with pt's wife and daughter.   Objective:  Vitals:   07/11/16 1208 07/11/16 1216  BP:  117/68  Pulse:  (!) 116  Resp:  16  Temp: 97.3 F (36.3 C) 97.3 F (36.3 C)    Body mass index is 24.52 kg/m. No intake or output data in the 24 hours ending 07/11/16 1459  Exam not performed   CBG (last 3)  No results for input(s): GLUCAP in the last 72 hours.   Labs:  Lab Results  Component Value Date   WBC 7.5 06/21/2016   HGB 11.8 (L) 06/21/2016   HCT 35.6 (L) 06/21/2016   MCV 86.2 06/21/2016   PLT 228 06/21/2016   NEUTROABS 4.8 06/20/2016    Urine Studies No results for input(s): UHGB, CRYS in the last 72 hours.  Invalid input(s): UACOL, UAPR, USPG, UPH, UTP, UGL, UKET, UBIL, UNIT, UROB, ULEU, UEPI, UWBC, URBC, UBAC, CAST, UCOM, BILUA  Basic Metabolic Panel: No results for input(s): NA, K, CL, CO2, GLUCOSE, BUN, CREATININE, CALCIUM, MG, PHOS in the last 168 hours. GFR Estimated Creatinine Clearance: 57.6 mL/min (by C-G formula based on SCr of 0.66 mg/dL). Liver Function Tests: No results for input(s): AST, ALT, ALKPHOS, BILITOT, PROT, ALBUMIN in the last 168 hours. No results for input(s): LIPASE, AMYLASE in the last 168 hours. No results for input(s): AMMONIA in the last 168 hours. Coagulation profile No results for input(s): INR, PROTIME in the last 168 hours.  CBC: No results for input(s): WBC, NEUTROABS, HGB, HCT, MCV, PLT in the last 168 hours. Cardiac Enzymes: No results for input(s): CKTOTAL, CKMB, CKMBINDEX, TROPONINI in the last 168 hours. BNP: Invalid input(s): POCBNP CBG: No results for input(s): GLUCAP in the last 168 hours. D-Dimer No results for input(s): DDIMER in the last 72 hours. Hgb A1c No results for input(s): HGBA1C in the last 72  hours. Lipid Profile No results for input(s): CHOL, HDL, LDLCALC, TRIG, CHOLHDL, LDLDIRECT in the last 72 hours. Thyroid function studies No results for input(s): TSH, T4TOTAL, T3FREE, THYROIDAB in the last 72 hours.  Invalid input(s): FREET3 Anemia work up No results for input(s): VITAMINB12, FOLATE, FERRITIN, TIBC, IRON, RETICCTPCT in the last 72 hours. Microbiology Recent Results (from the past 240 hour(s))  MRSA PCR Screening     Status: None   Collection Time: 07/11/16 12:14 PM  Result Value Ref Range Status   MRSA by PCR NEGATIVE NEGATIVE Final    Comment:        The GeneXpert MRSA Assay (FDA approved for NASAL specimens only), is one component of a comprehensive MRSA colonization surveillance program. It is not intended to diagnose MRSA infection nor to guide or monitor treatment for MRSA infections.       Studies:  No results found.  Assessment: 83 y.o.male who was admitted for SBO due to cancer.    1. 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 AF 4. CHF    Plan:  -I discussed his recent CT scan findings, which was reviewed in our GI tumor Board yesterday morning, and unfortunately the scan showed liver metastasis, worsening by observation due to the tumor in detail of the pancreas -She is not a candidate for palliative surgery or chronic stenting, which was  discussed before with patient and his family. I recommend hospice, and a comfort care. Patient's wife and daughter has agreed.  -I have contacted hospice of [facility], and left a message about the referral, hopefully he can go home or their inpt hospice facility tomorrow.  -Pt has a hospital bed and bedside commode at home, would need medications for comfort measurements on discharge.    Truitt Merle, MD 07/11/2016  2:59 PM

## 2016-07-11 NOTE — Progress Notes (Signed)
Patient from ICU. Patient is drowsy,slightly opened eyes when name was called. Vital signs obtained. Patient kept clean and dry. Will continue to monitor.

## 2016-07-12 ENCOUNTER — Encounter: Payer: Self-pay | Admitting: *Deleted

## 2016-07-12 ENCOUNTER — Telehealth: Payer: Self-pay | Admitting: *Deleted

## 2016-07-12 MED ORDER — ACETAMINOPHEN 325 MG PO TABS: 650.0000 mg | ORAL_TABLET | Freq: Four times a day (QID) | ORAL | 0 refills | Status: AC | PRN

## 2016-07-12 MED ORDER — MORPHINE SULFATE (CONCENTRATE) 10 MG/0.5ML PO SOLN: 10.0000 mg | ORAL | 0 refills | Status: AC | PRN

## 2016-07-12 MED ORDER — HALOPERIDOL 0.5 MG PO TABS: 0.5000 mg | ORAL_TABLET | ORAL | 0 refills | Status: AC | PRN

## 2016-07-12 MED ORDER — GLYCOPYRROLATE 1 MG PO TABS: 1.0000 mg | ORAL_TABLET | ORAL | 0 refills | Status: AC | PRN

## 2016-07-12 MED ORDER — FLEET ENEMA 7-19 GM/118ML RE ENEM
1.0000 | ENEMA | Freq: Two times a day (BID) | RECTAL | Status: DC | PRN
  Filled 2016-07-12: qty 1

## 2016-07-12 MED ORDER — ONDANSETRON 4 MG PO TBDP: 8.0000 mg | ORAL_TABLET | Freq: Three times a day (TID) | ORAL | Status: DC | PRN

## 2016-07-12 MED ORDER — MORPHINE SULFATE (CONCENTRATE) 10 MG/0.5ML PO SOLN: 10.0000 mg | ORAL | Status: DC | PRN

## 2016-07-12 MED ORDER — ONDANSETRON 8 MG PO TBDP: 8.0000 mg | ORAL_TABLET | Freq: Three times a day (TID) | ORAL | 0 refills | Status: AC | PRN

## 2016-07-12 MED ORDER — PROMETHAZINE HCL 25 MG PO TABS: 12.5000 mg | ORAL_TABLET | Freq: Three times a day (TID) | ORAL | Status: DC

## 2016-07-12 MED ORDER — SORBITOL 70 % SOLN: 960.0000 mL | TOPICAL_OIL | Freq: Once | ORAL | Status: DC

## 2016-07-12 MED ORDER — DEXAMETHASONE SODIUM PHOSPHATE 4 MG/ML IJ SOLN
4.0000 mg | INTRAMUSCULAR | Status: DC
  Administered 2016-07-12: 4 mg via INTRAVENOUS
  Filled 2016-07-12: qty 1

## 2016-07-12 MED ORDER — SORBITOL 70 % SOLN
960.0000 mL | TOPICAL_OIL | ORAL | Status: AC
  Administered 2016-07-12: 960 mL via RECTAL
  Filled 2016-07-12: qty 240

## 2016-07-12 NOTE — Discharge Instructions (Signed)
Constipation, Adult °Constipation is when a person: °· Poops (has a bowel movement) fewer times in a week than normal. °· Has a hard time pooping. °· Has poop that is dry, hard, or bigger than normal. ° °Follow these instructions at home: °Eating and drinking ° °· Eat foods that have a lot of fiber, such as: °? Fresh fruits and vegetables. °? Whole grains. °? Beans. °· Eat less of foods that are high in fat, low in fiber, or overly processed, such as: °? French fries. °? Hamburgers. °? Cookies. °? Candy. °? Soda. °· Drink enough fluid to keep your pee (urine) clear or pale yellow. °General instructions °· Exercise regularly or as told by your doctor. °· Go to the restroom when you feel like you need to poop. Do not hold it in. °· Take over-the-counter and prescription medicines only as told by your doctor. These include any fiber supplements. °· Do pelvic floor retraining exercises, such as: °? Doing deep breathing while relaxing your lower belly (abdomen). °? Relaxing your pelvic floor while pooping. °· Watch your condition for any changes. °· Keep all follow-up visits as told by your doctor. This is important. °Contact a doctor if: °· You have pain that gets worse. °· You have a fever. °· You have not pooped for 4 days. °· You throw up (vomit). °· You are not hungry. °· You lose weight. °· You are bleeding from the anus. °· You have thin, pencil-like poop (stool). °Get help right away if: °· You have a fever, and your symptoms suddenly get worse. °· You leak poop or have blood in your poop. °· Your belly feels hard or bigger than normal (is bloated). °· You have very bad belly pain. °· You feel dizzy or you faint. °This information is not intended to replace advice given to you by your health care provider. Make sure you discuss any questions you have with your health care provider. °Document Released: 07/24/2007 Document Revised: 08/25/2015 Document Reviewed: 07/26/2015 °Elsevier Interactive Patient Education ©  2017 [employer]. ° °

## 2016-07-12 NOTE — Progress Notes (Signed)
SMOG enema given with moderate results. Patient expresses relief

## 2016-07-12 NOTE — Progress Notes (Signed)
[facility] Psychosocial Distress Screening Clinical Social Work  Clinical Social Work was referred by distress screening protocol.  The patient scored a 6 on the Psychosocial Distress Thermometer which indicates moderate distress. Clinical Social Worker reviewed chart to assess for distress and other psychosocial needs. Pt has recently referred to hospice. No further [facility] CSW needs noted as these concerns will be addressed through hospice.   ONCBCN DISTRESS SCREENING 06/25/2016  Screening Type Initial Screening  Distress experienced in past week (1-10) 6  Practical problem type Food  Emotional problem type Adjusting to illness  Information Concerns Type Lack of info about maintaining fitness  Physical Problem type Pain;Nausea/vomiting;Breathing;Loss of appetitie;Constipation/diarrhea;Swollen arms/legs  Physician notified of physical symptoms Yes  Referral to clinical social work Yes    Clinical Social Worker follow up needed: No.  If yes, follow up plan:  Loren Racer, Marlinda Mike, OSW-C Clinical Social Worker [place]  [facility] Phone: [phone] Fax: [phone]

## 2016-07-12 NOTE — Telephone Encounter (Signed)
Received return call from [facility] regarding referral from Dr Burr Medico.  Information given & request Hospice house.  Plan on possible d/c today.  Will fax demographics & H&P to [phone],

## 2016-07-12 NOTE — Telephone Encounter (Signed)
Prior to the patient being able to move forward with radiation, we recommended a repeat CT to determine extent of disease prior to ablative SBRT. He unfortunately has disease in the liver and more concerning disease in the nodes of the abdomen. As a result we would offer palliative radiotherapy. However he was taken in transfer today from [place] due to concerns with possible bowel obstruction from his large tumor. We suspect that this is a LBO, and medical oncology has weighed in on his case upon arriving in [place] and have recommended hospice care as the patient is not a candidate for surgery due to cardiac morbidities. I spoke with his daughter regarding the findings, and she feels like hospice is the right direction to proceed. We discussed that if his bowels were working and he had made significant progress, palliative radiotherapy could be reconsidered, however at this time we will stay the course with hospice plans. [name], his daughter will call me back if they wish to revisit radiation.

## 2016-07-12 NOTE — Care Management Note (Signed)
Case Management Note  Patient Details  Name: Benjamin Hurley MRN: [id number] Date of Birth: 01/05/1926  Subjective/Objective:      83 yo admitted with SBO.              Action/Plan: Pt from home with spouse and had already met with [facility] at home to start services. This CM met with pt, wife and daughter Sharon at bedside to clarify home with hospice was the plan. Family confirms that pt wants to go home with [facility] hosp[facility]. Per daughter, pt has all equipment needed (adjustable bed, wheelchair, walker and 3in1). This CM contacted [facility] and confirmed referral for home hospice. This CM will fax H&P and demographics (fax:  [phone]). Pt to transport home via personal car.  Expected Discharge Date:                  Expected Discharge Plan:  Home w Hospice Care  In-House Referral:     Discharge planning Services  CM Consult  Post Acute Care Choice:  Hospice Choice offered to:  Patient, Spouse, Adult Children  DME Arranged:    DME Agency:     HH Arranged:  Disease Management HH Agency:  [facility]  Status of Service:  In process, will continue to follow  If discussed at Long Length of Stay Meetings, dates discussed:    Additional Comments:  ,  H, RN 07/12/2016, 10:08 AM  [phone]  

## 2016-07-12 NOTE — Progress Notes (Signed)
Unable to keep a condom catheter on pt due to anatomy, refused indwelling or I&O cath, assisted pt up to [facility] - voided approx 750 concentrated urine. Returned to bed - 2 family members spending th night in the room.

## 2016-07-12 NOTE — Consult Note (Signed)
Consultation Note Date: 07/12/2016   Patient Name: Benjamin Hurley  DOB: 08-15-1925  MRN: [id number]  Age / Sex: 83 y.o., male  PCP: Cleophas Dunker, MD Referring Physician: Robbie Lis, MD  Reason for Consultation: Hospice Evaluation, Non pain symptom management, Pain control and Psychosocial/spiritual support  HPI/Patient Profile: 83 y.o. male with metastatic pancreatic cancer-now approaching end of life. Dr. Burr Medico has recommended hospice care and patient and family are agreeable. He is struggling with obstruction and severe constipation, increasing pain and persistent nausea.  Clinical Assessment and Goals of Care: Appropriate for Hospice Care at home. Transitioning to full comfort care.  Goals are now symptom management and support for family to keep him at home.   SUMMARY OF RECOMMENDATIONS    1. Aggressive bowel regimen and try to decompress his bowels prior to discharge if this is possible. 2. CMRN to set up [facility] Home 3. I have adjusted his pain regimen and symptom meds- see below.   Code Status/Advance Care Planning:  DNR    Symptom Management:   1. Pain: Roxanol 10mg  SL q2 PRN, will probably need this to be scheduled every 4 hours. 2. Nausea: trigger is mechanical obstruction-zofran is probably not very helpful- promethazine or compazine, even haldol would be more helpful- ok to use with zofram ODT at home.  3. Constipation/Obstruction: SMOG 4. I have started him on Decadron for his obstruction and this will also help with his nausea and possibly help him feel better even if for a short time while he goes home.   Palliative Prophylaxis:   Bowel Regimen  Additional Recommendations (Limitations, Scope, Preferences):  Avoid Hospitalization  Psycho-social/Spiritual:   Desire for further Chaplaincy support:yes  Additional Recommendations: Caregiving   Support/Resources  Prognosis:   < 2 weeks  Discharge Planning: Home with Hospice      Primary Diagnoses: Present on Admission: . SBO (small bowel obstruction) ([place]) . Stricture of colon ([place]) . Pancreatic adenocarcinoma ([place]) . Chronic systolic CHF (congestive heart failure) ([place]) . Atrial fibrillation with RVR ([place]) . Vomiting   I have reviewed the medical record, interviewed the patient and family, and examined the patient. The following aspects are pertinent.  Past Medical History:  Diagnosis Date  . A-fib ([place])    on Eliquis  . Arthritis    neck  . BPH (benign prostatic hyperplasia)   . Cancer [facility])    pancreatic cancer  . Carotid stenosis   . CHF (congestive heart failure) ([place])   . Diabetes ([place])    type 2  . Essential hypertension   . GERD (gastroesophageal reflux disease)   . Glaucoma    both eyes  . Headache   . Peripheral neuropathy   . Stroke [facility]) 2014   left side weakness   Social History   Social History  . Marital status: Married    Spouse name: N/A  . Number of children: N/A  . Years of education: N/A   Occupational History  . retired    Social History Main  Topics  . Smoking status: Former Smoker    Years: 5.00    Types: Pipe    Quit date: 1968  . Smokeless tobacco: Former Systems developer    Types: Chew    Quit date: 1968  . Alcohol use No  . Drug use: No  . Sexual activity: Not Asked   Other Topics Concern  . None   Social History Narrative  . None   Family History  Problem Relation Age of Onset  . Cancer Mother        cancer    Scheduled Meds: . dexamethasone  4 mg Intravenous Q24H  . sorbitol, milk of mag, mineral oil, glycerin (SMOG) enema  960 mL Rectal NOW   Continuous Infusions: . sodium chloride 50 mL/hr at 07/12/16 0730   PRN Meds:.acetaminophen **OR** acetaminophen, antiseptic oral rinse, glycopyrrolate **OR** glycopyrrolate **OR** glycopyrrolate, haloperidol **OR** haloperidol **OR** haloperidol lactate, morphine  injection, morphine CONCENTRATE, polyvinyl alcohol, sodium phosphate Medications Prior to Admission:  Prior to Admission medications   Medication Sig Start Date End Date Taking? Authorizing Provider  acetaminophen (TYLENOL) 500 MG tablet Take 1,000 mg by mouth daily.   Yes [provider]  apixaban (ELIQUIS) 5 MG TABS tablet Take 5 mg by mouth 2 (two) times daily.   Yes [provider]  carboxymethylcellulose (REFRESH PLUS) 0.5 % SOLN Place 1 drop into both eyes 2 (two) times daily.   Yes [provider]  diphenhydramine-acetaminophen (TYLENOL PM) 25-500 MG TABS tablet Take 1 tablet by mouth at bedtime.   Yes [provider]  docusate sodium (COLACE) 100 MG capsule Take 100 mg by mouth 2 (two) times daily.   Yes [provider]  feeding supplement, GLUCERNA SHAKE, (GLUCERNA SHAKE) LIQD Take 237 mLs by mouth 2 (two) times daily with breakfast and lunch.    Yes [provider]  furosemide (LASIX) 40 MG tablet Take 40 mg by mouth daily. Take as needed for dyspnea, edema in feet.   Yes [provider]  gabapentin (NEURONTIN) 300 MG capsule Take 300 mg by mouth at bedtime.  07/08/16  Yes [provider]  glipiZIDE (GLUCOTROL) 5 MG tablet Take 5 mg by mouth daily. 06/05/16  Yes [provider]  latanoprost (XALATAN) 0.005 % ophthalmic solution Place 1 drop into both eyes at bedtime.   Yes [provider]  metoprolol (LOPRESSOR) 50 MG tablet Take 1 tablet (50 mg total) by mouth 2 (two) times daily. 05/17/16  Yes Arrien, Jimmy Picket, MD  Multiple Vitamin (MULTIVITAMIN WITH MINERALS) TABS tablet Take 1 tablet by mouth daily. 05/17/16  Yes Arrien, Jimmy Picket, MD  ondansetron (ZOFRAN) 8 MG tablet Take 1 tablet (8 mg total) by mouth every 8 (eight) hours as needed for nausea or vomiting. 06/25/16  Yes Hayden Pedro, PA-C  pantoprazole (PROTONIX) 40 MG tablet Take 40 mg by mouth daily.   Yes [provider]  promethazine (PHENERGAN) 25 MG tablet Take 12.5 mg by mouth every 4 (four) hours as needed for nausea or vomiting.   Yes [provider]  simethicone (MYLICON) 80 MG chewable tablet Chew 80 mg by mouth every 6 (six) hours as needed for flatulence.   Yes [provider]  sodium bicarbonate 650 MG tablet Take 1 tablet (650 mg total) by mouth daily. 06/23/16  Yes Florencia Reasons, MD  sucralfate (CARAFATE) 1 GM/10ML suspension Take 1 g by mouth 2 (two) times daily.   Yes [provider]  Vancomycin HCl (FIRST-VANCOMYCIN 25) 25 MG/ML  SOLN Take 125 mg by mouth. Oral vancomycin 125mg  4 times a day for 21 days, then 125mg  3 times a day for 7days, then 125mg  2 times a day for 7 days, then 125mg  once daily for 14 days, then 125mg  every other day for 14 days, then stop.   Yes [provider]  glycopyrrolate (ROBINUL) 1 MG tablet Take 1 tablet (1 mg total) by mouth every 4 (four) hours as needed (excessive secretions). 07/12/16   Robbie Lis, MD   Allergies  Allergen Reactions  . Aspirin Nausea Only and Other (See Comments)    Makes pt very sick   Review of Systems  Physical Exam  Vital Signs: BP 102/86 (BP Location: Right Arm)   Pulse (!) 47   Temp 98.2 F (36.8 C) (Oral)   Resp 14   Ht 5\' 6"  (1.676 m)   Wt 68.9 kg (151 lb 14.4 oz)   SpO2 92%   BMI 24.52 kg/m  Pain Assessment: 0-10   Pain Score: 5    SpO2: SpO2: 92 % O2 Device:SpO2: 92 % O2 Flow Rate: .   IO: Intake/output summary:  Intake/Output Summary (Last 24 hours) at 07/12/16 1035 Last data filed at 07/12/16 0930  Gross per 24 hour  Intake             1435 ml  Output              750 ml  Net              685 ml    LBM: Last BM Date: 07/06/16 Baseline Weight: Weight: 68.9 kg (151 lb 14.4 oz) Most recent weight: Weight: 68.9 kg (151 lb 14.4 oz)     Palliative Assessment/Data:   Flowsheet Rows     Most Recent Value  Intake Tab  Referral Department  Hospitalist  Unit at Time  of Referral  Oncology Unit  Palliative Care Primary Diagnosis  Cancer  Date Notified  07/11/16  Palliative Care Type  Return patient Palliative Care  Reason for referral  Clarify Goals of Care  Date of Admission  07/11/16  Date first seen by Palliative Care  07/12/16  # of days IP prior to Palliative referral  0  Clinical Assessment  Palliative Performance Scale Score  20%  Pain Max last 24 hours  10  Pain Min Last 24 hours  3  Dyspnea Max Last 24 Hours  0  Dyspnea Min Last 24 hours  0  Nausea Max Last 24 Hours  10  Nausea Min Last 24 Hours  8  Anxiety Max Last 24 Hours  0  Anxiety Min Last 24 Hours  0  Other Max Last 24 Hours  0  Psychosocial & Spiritual Assessment  Social Work Plan of Care  Grief and Bereavement support  Palliative Care Outcomes  Patient/Family meeting held?  Yes  Palliative Care Outcomes  Improved pain interventions, Transitioned to hospice       Time Total: 50 minutes Greater than 50%  of this time was spent counseling and coordinating care related to the above assessment and plan.  Signed by: Lane Hacker, DO   Please contact Palliative Medicine Team phone at [phone] for questions and concerns.  For individual provider: See Shea Evans

## 2016-07-12 NOTE — Discharge Summary (Signed)
Physician Discharge Summary  Benjamin Hurley NWG:[id number] DOB: 11/02/1925 DOA: 07/11/2016  PCP: Cleophas Dunker, MD  Admit date: 07/11/2016 Discharge date: 07/12/2016  Recommendations for Outpatient Follow-up:  1. Follow up with PCP in next week or so to make sure symptoms are controlled.   Discharge Diagnoses:  Principal Problem:   SBO (small bowel obstruction) (HCC) Active Problems:   Atrial fibrillation with RVR (HCC)   Goals of care, counseling/discussion   Stricture of colon (HCC)   Chronic systolic CHF (congestive heart failure) (HCC)   Pancreatic adenocarcinoma (HCC)   Vomiting    Discharge Condition: stable   Diet recommendation: as tolerated   History of present illness:   Per HPI: "83 y.o. male with medical history significant of pancreatic cancer invading into the colon causing a chronic partial obstruction for which she is on a liquid diet for the past few weeks, he recently was admitted to the hospital for C. difficile colitis, he also has a  history of chronic systolic heart, atrial fibrillation and is on chronic anticoagulation-he presented to [facility] with worsening abdominal pain, bloating, nausea and a few episodes of vomiting. Per family, patient has had pain and nausea ongoing for the past few weeks, but these have dramatically worsened over the past few days. Patient had a few episodes of nonbloody nonbilious vomiting. Pain is mostly in the mid abdominal area, per patient it is sharp, without any radiation, 10/10 at its intensity, with no particular aggravating or relieving factors. Per family, hospice has already been involved-and they have prescribed him narcotics which the family has not yet filled. Family has tried to control the pain with just Tylenol.  Per patient, he has not had a bowel movement and close to a week, family has tried numerous over-the-counter remedies like prune juice and MiraLAX without any success.  While at [facility], he  was found to have abdominal distention-likely secondary to worsening of his chronic small bowel obstruction. CT scan also showed liver metastases which are new.  NG tube was inserted, but due to discomfort, patient pulled it out. He apparently has had no vomiting today, but abdomen continues to be distended.  He was subsequently transferred to [facility] for multidisciplinary care including hospitalist, oncology, possible general surgery and palliative evaluation."   Hospital Course:   Principal Problem:   SBO (small bowel obstruction) ([place]) - Pt initially with NG tube but pulled it out - Pt felt better and expressed wish to go home - Per oncology, okay for discharge with hospice   Active Problems:   Atrial fibrillation, paroxysmal - CHADS vasc score at least 3 - Held apixaban due to risk of bleed - Rate controlled with metoprolol    Chronic systolic CHF (congestive heart failure) ([place]) - Compensated - Continue lasix    Pancreatic adenocarcinoma ([place]) - Hospice recommended by oncology     Signed:  Leisa Lenz, MD  [facility] 07/12/2016, 2:05 PM  Pager #: [phone]  Time spent in minutes: less than 30 minutes   Consultations:  Oncology, Dr. Burr Medico   Discharge Exam: Vitals:   07/11/16 1556 07/11/16 2217  BP: 128/86 102/86  Pulse: (!) 135 (!) 47  Resp: 14 14  Temp: 98.6 F (37 C) 98.2 F (36.8 C)   Vitals:   07/11/16 1500 07/11/16 1523 07/11/16 1556 07/11/16 2217  BP: 111/73  128/86 102/86  Pulse: 96  (!) 135 (!) 47  Resp: 15  14 14   Temp:  (!) 96.4 F (35.8  C) 98.6 F (37 C) 98.2 F (36.8 C)  TempSrc:  Oral Axillary Oral  SpO2: 95%  98% 92%  Weight:      Height:        General: Pt is alert, follows commands appropriately, not in acute distress Cardiovascular: Regular rate and rhythm, S1/S2 + Respiratory: Clear to auscultation bilaterally, no wheezing, no crackles, no rhonchi Abdominal: Soft, non tender, non distended, bowel  sounds +, no guarding Extremities: no edema, no cyanosis, pulses palpable bilaterally DP and PT Neuro: Grossly nonfocal  Discharge Instructions  Discharge Instructions    Call MD for:  persistant nausea and vomiting    Complete by:  As directed    Call MD for:  redness, tenderness, or signs of infection (pain, swelling, redness, odor or green/yellow discharge around incision site)    Complete by:  As directed    Call MD for:  severe uncontrolled pain    Complete by:  As directed    Diet - low sodium heart healthy    Complete by:  As directed    Increase activity slowly    Complete by:  As directed      Allergies as of 07/12/2016      Reactions   Aspirin Nausea Only, Other (See Comments)   Makes pt very sick      Medication List    STOP taking these medications   apixaban 5 MG Tabs tablet Commonly known as:  ELIQUIS   diphenhydramine-acetaminophen 25-500 MG Tabs tablet Commonly known as:  TYLENOL PM   furosemide 40 MG tablet Commonly known as:  LASIX   gabapentin 300 MG capsule Commonly known as:  NEURONTIN   glipiZIDE 5 MG tablet Commonly known as:  GLUCOTROL     TAKE these medications   acetaminophen 500 MG tablet Commonly known as:  TYLENOL Take 1,000 mg by mouth daily. What changed:  Another medication with the same name was added. Make sure you understand how and when to take each.   acetaminophen 325 MG tablet Commonly known as:  TYLENOL Take 2 tablets (650 mg total) by mouth every 6 (six) hours as needed for mild pain (or Fever >/= 101). What changed:  You were already taking a medication with the same name, and this prescription was added. Make sure you understand how and when to take each.   carboxymethylcellulose 0.5 % Soln Commonly known as:  REFRESH PLUS Place 1 drop into both eyes 2 (two) times daily.   docusate sodium 100 MG capsule Commonly known as:  COLACE Take 100 mg by mouth 2 (two) times daily.   feeding supplement (GLUCERNA SHAKE)  Liqd Take 237 mLs by mouth 2 (two) times daily with breakfast and lunch.   FIRST-VANCOMYCIN 25 25 MG/ML Soln Take 125 mg by mouth. Oral vancomycin 125mg  4 times a day for 21 days, then 125mg  3 times a day for 7days, then 125mg  2 times a day for 7 days, then 125mg  once daily for 14 days, then 125mg  every other day for 14 days, then stop.   glycopyrrolate 1 MG tablet Commonly known as:  ROBINUL Take 1 tablet (1 mg total) by mouth every 4 (four) hours as needed (excessive secretions).   haloperidol 0.5 MG tablet Commonly known as:  HALDOL Take 1 tablet (0.5 mg total) by mouth every 4 (four) hours as needed for agitation (or delirium).   latanoprost 0.005 % ophthalmic solution Commonly known as:  XALATAN Place 1 drop into both eyes at bedtime.  metoprolol tartrate 50 MG tablet Commonly known as:  LOPRESSOR Take 1 tablet (50 mg total) by mouth 2 (two) times daily.   morphine CONCENTRATE 10 MG/0.5ML Soln concentrated solution Take 0.5 mLs (10 mg total) by mouth every 2 (two) hours as needed for moderate pain, severe pain, anxiety or shortness of breath.   multivitamin with minerals Tabs tablet Take 1 tablet by mouth daily.   ondansetron 8 MG disintegrating tablet Commonly known as:  ZOFRAN-ODT Take 1 tablet (8 mg total) by mouth every 8 (eight) hours as needed for nausea or vomiting.   ondansetron 8 MG tablet Commonly known as:  ZOFRAN Take 1 tablet (8 mg total) by mouth every 8 (eight) hours as needed for nausea or vomiting.   pantoprazole 40 MG tablet Commonly known as:  PROTONIX Take 40 mg by mouth daily.   promethazine 25 MG tablet Commonly known as:  PHENERGAN Take 12.5 mg by mouth every 4 (four) hours as needed for nausea or vomiting.   simethicone 80 MG chewable tablet Commonly known as:  MYLICON Chew 80 mg by mouth every 6 (six) hours as needed for flatulence.   sodium bicarbonate 650 MG tablet Take 1 tablet (650 mg total) by mouth daily.   sucralfate 1 GM/10ML  suspension Commonly known as:  CARAFATE Take 1 g by mouth 2 (two) times daily.      Follow-up Information    Cleophas Dunker, MD. Schedule an appointment as soon as possible for a visit in 1 week(s).   Specialty:  Nurse Practitioner Contact information: [place] [address] [phone]            The results of significant diagnostics from this hospitalization (including imaging, microbiology, ancillary and laboratory) are listed below for reference.    Significant Diagnostic Studies: Ct Abdomen W Contrast  Result Date: 07/04/2016 CLINICAL DATA:  Abdominal pain for 3 years with nausea. Pancreatic adenocarcinoma. Diagnosed in April. EXAM: CT ABDOMEN WITH CONTRAST TECHNIQUE: Multidetector CT imaging of the abdomen was performed using the standard protocol following bolus administration of intravenous contrast. CONTRAST:  100 cc of Isovue 370 COMPARISON:  MRI of 05/16/2015.  CT of 05/10/2015. FINDINGS: Lower chest: Bibasilar atelectasis. Mild cardiomegaly, without pericardial effusion. Multivessel coronary artery atherosclerosis. Small bilateral pleural effusions. Hepatobiliary: Development of vague hepatic lesions, including a hypoattenuating hepatic dome 3.0 cm lesion on image 15/ series 8, and a 1.9 cm hypoattenuating lesion on image 47/series 8. high left hepatic lobe lesion is similar and favored to represent a cyst, including on image 19/series 8. Normal gallbladder. No biliary duct dilatation. Pancreas: Pancreatic atrophy with duct dilatation throughout. Stone within the pancreatic duct in the region of the pancreatic head, including at 15 mm on image 53/ series 8. This is chronic. Pancreatic tail mass with direct extension into the spleen. This is ill-defined and somewhat difficult to quantify. Measures on the order of 7.8 x 3.2 cm on image 44/series 8. Compare 7.8 x 3.0 cm on the prior exam (when remeasured). This suggests gross stability. A hypoattenuating more  inferior component or adjacent collection measures 3.1 cm on image 47/series 8 and is new since the prior CT. Spleen: Minimal anterior splenic infarct is new on image 38/series 8. Adrenals/Urinary Tract: Bilateral adrenal thickening. Mild renal cortical thinning bilaterally. No hydronephrosis. Stomach/Bowel: Gastric antral underdistention. Scattered colonic diverticula. Splenic flexure involvement with tumor again identified. The more proximal colon remains mildly dilated, including at 7.2 cm. Mid small bowel loops are dilated up to 4.0  cm on image 79/series 8. Increased from 3.3 cm maximally on the prior CT. Vascular/Lymphatic: Advanced aortic and branch vessel atherosclerosis. Soft tissue thickening, likely indicative of adenopathy, along the tract of the inferior mesenteric artery. 1.8 x 3.0 cm on image 94/series 8. Compare 1.5 x 2.7 cm previously. Other: Increase in small volume abdominal ascites. No well-defined omental or peritoneal metastasis. Musculoskeletal: Anasarca. Thoracolumbar spondylosis with areas of trace malalignment, likely secondary. IMPRESSION: 1. Grossly similar size of a a pancreatic tail mass with direct extension into the splenic hilum. An adjacent lower density component could represent progressive tumor or be related to concurrent pancreatitis. 2. Development of low-density hepatic lesions, most consistent with metastatic disease. 3. dilated colon proximal to the left upper quadrant mass. Suspicious for partial colonic obstruction. Mid small bowel dilatation is slightly increased since the prior CT and may represent Obstruction as well. 4. Slight increase in abdominal ascites. 5. Small bilateral pleural effusions with bibasilar atelectasis. 6. Slight increase in soft tissue thickening along the inferior mesenteric artery, likely due to progressive adenopathy. 7. Small volume splenic infarct, new. 8. Chronic pancreatitis. Electronically Signed   By: Abigail Miyamoto M.D.   On: 07/04/2016 13:20     Microbiology: Recent Results (from the past 240 hour(s))  MRSA PCR Screening     Status: None   Collection Time: 07/11/16 12:14 PM  Result Value Ref Range Status   MRSA by PCR NEGATIVE NEGATIVE Final    Comment:        The GeneXpert MRSA Assay (FDA approved for NASAL specimens only), is one component of a comprehensive MRSA colonization surveillance program. It is not intended to diagnose MRSA infection nor to guide or monitor treatment for MRSA infections.      Labs: Basic Metabolic Panel: No results for input(s): NA, K, CL, CO2, GLUCOSE, BUN, CREATININE, CALCIUM, MG, PHOS in the last 168 hours. Liver Function Tests: No results for input(s): AST, ALT, ALKPHOS, BILITOT, PROT, ALBUMIN in the last 168 hours. No results for input(s): LIPASE, AMYLASE in the last 168 hours. No results for input(s): AMMONIA in the last 168 hours. CBC: No results for input(s): WBC, NEUTROABS, HGB, HCT, MCV, PLT in the last 168 hours. Cardiac Enzymes: No results for input(s): CKTOTAL, CKMB, CKMBINDEX, TROPONINI in the last 168 hours. BNP: BNP (last 3 results)  Recent Labs  06/02/16 1412  BNP 1,005.6*    ProBNP (last 3 results) No results for input(s): PROBNP in the last 8760 hours.  CBG: No results for input(s): GLUCAP in the last 168 hours.

## 2016-07-12 NOTE — Progress Notes (Signed)
Nutrition Brief Note  Pt identified for MST   Chart reviewed. Per MD note, "after extensive discussion with family, we have decided to forego aggressive care and focus mostly on hospice/comfort care"  No further nutrition interventions warranted at this time.  Please re-consult as needed.   Koleen Distance MS, RD, LDN Pager #- [phone]

## 2016-07-16 ENCOUNTER — Encounter: Payer: Self-pay | Admitting: *Deleted

## 2016-07-16 ENCOUNTER — Other Ambulatory Visit: Payer: Self-pay | Admitting: *Deleted

## 2016-07-16 NOTE — Patient Outreach (Signed)
Telephone call to patient's daughter Warwick Nick, HIPAA verified, [name] states pt is at home now and has in home hospice services.  RN CM sent in basket to [facility] and informed of case closure.  RN CM faxed case closure letter to primary MD Dr. Cleophas Dunker.  [name] [facility] [place] Coordinator [phone]

## 2016-07-17 ENCOUNTER — Ambulatory Visit: Payer: PPO | Admitting: Radiation Oncology

## 2016-07-17 ENCOUNTER — Ambulatory Visit: Payer: Self-pay | Admitting: *Deleted

## 2016-07-18 ENCOUNTER — Ambulatory Visit: Payer: PPO | Admitting: Radiation Oncology

## 2016-07-19 ENCOUNTER — Ambulatory Visit: Payer: PPO

## 2016-07-22 ENCOUNTER — Ambulatory Visit: Payer: PPO

## 2016-07-22 ENCOUNTER — Encounter: Payer: Self-pay | Admitting: Licensed Clinical Social Worker

## 2016-07-22 ENCOUNTER — Other Ambulatory Visit: Payer: Self-pay | Admitting: Licensed Clinical Social Worker

## 2016-07-22 NOTE — Patient Outreach (Signed)
Assessment:  CSW and RN [name] have been providing [facility] program support for client in recent weeks. [name] RN has provided [facility] care for client. CSW Theadore Nan has provided [facility] CSW services support for client. RN [name] informed CSW that client has now been accepted [facility] for care. Almyra Free informed CSW that client was now receiving in home support from [facility].  Thus, [name] informed CSW that she has now discharged client from [facility]. Since client is now receiving in home services with [facility] [place], [place] is discharging Adryen Cookson from [facility] CSW services on 07/22/16.  Plan:  CSW is discharging Richardson Dopp from [facility] CSW services on 07/22/16 since client is now receiving in home care with [facility] [place].  CSW to inform Alycia Rossetti that [facility] discharged client from [facility] services on 07/22/16.  CSW to fax physician case closure letter to Dr. Anda Latina informing Dr. Anda Latina that [facility] discharged client from [facility] CSW services on 07/22/16.   [name].Megahn Killings MSW, LCSW Licensed Clinical Social Worker [facility] Care Management [phone]

## 2016-07-23 ENCOUNTER — Ambulatory Visit: Payer: PPO

## 2016-07-23 DIAGNOSIS — R69 Illness, unspecified: Secondary | ICD-10-CM | POA: Diagnosis not present

## 2016-07-24 ENCOUNTER — Ambulatory Visit: Payer: PPO

## 2016-07-25 ENCOUNTER — Ambulatory Visit: Payer: PPO

## 2016-07-26 ENCOUNTER — Ambulatory Visit: Payer: PPO | Admitting: Radiation Oncology

## 2016-07-26 ENCOUNTER — Ambulatory Visit: Payer: PPO | Admitting: Cardiovascular Disease

## 2016-07-29 ENCOUNTER — Ambulatory Visit: Payer: PPO

## 2016-07-30 ENCOUNTER — Ambulatory Visit: Payer: PPO

## 2016-07-31 ENCOUNTER — Ambulatory Visit: Payer: Self-pay | Admitting: *Deleted

## 2016-08-01 ENCOUNTER — Telehealth: Payer: Self-pay | Admitting: *Deleted

## 2016-08-07 ENCOUNTER — Ambulatory Visit: Payer: PPO | Admitting: Licensed Clinical Social Worker

## 2016-08-18 NOTE — Telephone Encounter (Signed)
Message given to Dr Burr Medico & Medical Records & Scheduling.

## 2016-08-18 NOTE — Telephone Encounter (Signed)
Benjamin Hurley from [facility] called wanting to notify MD Burr Medico that patient passed away at home this morning 08-23-16 @ 6:41am

## 2016-08-18 DEATH — deceased

## 2019-03-15 IMAGING — CT CT ANGIO CHEST
2 of 6 series · 18 of 36 positions shown · IV contrast (ISOVUE 370)
Comparison: None

CLINICAL DATA: Shortness of breath, possible PE

EXAM:
CT ANGIOGRAPHY CHEST WITH CONTRAST
TECHNIQUE: Multidetector CT imaging of the chest was performed using the
standard protocol during bolus administration of intravenous
contrast. Multiplanar CT image reconstructions and MIPs were
obtained to evaluate the vascular anatomy.
CONTRAST:  80 cc Isovue

[Series 6: thins for pacs · axial · 0.70mm/px · z∈[-300,-56]mm · 17 of 272 slices shown]
[im 14/272  lung]
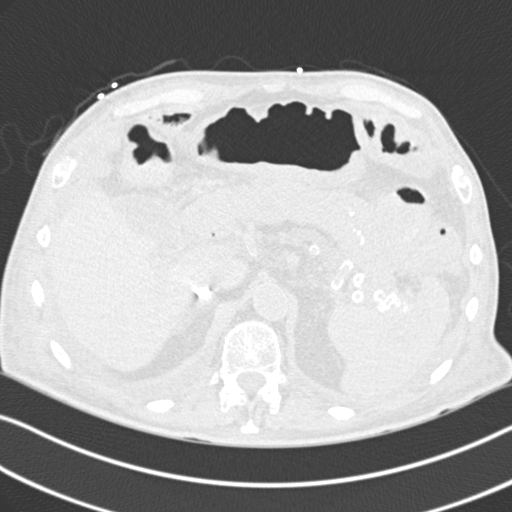
[im 28/272  mediastinal]
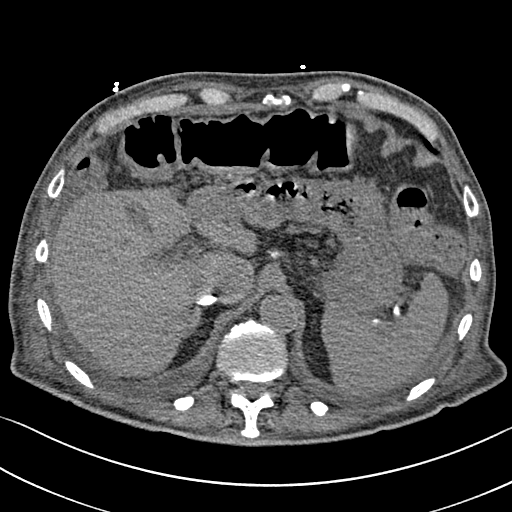
[im 41/272  lung]
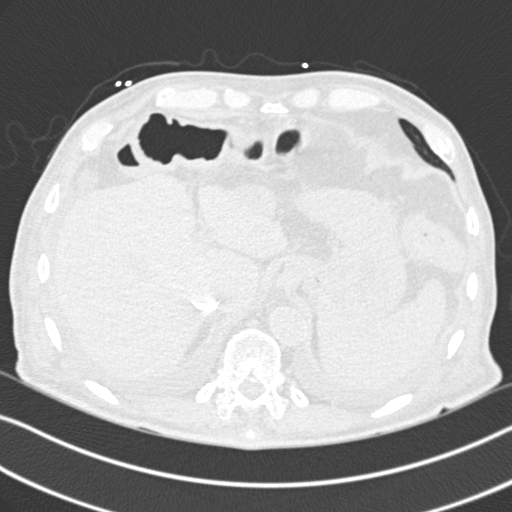
[im 55/272  mediastinal]
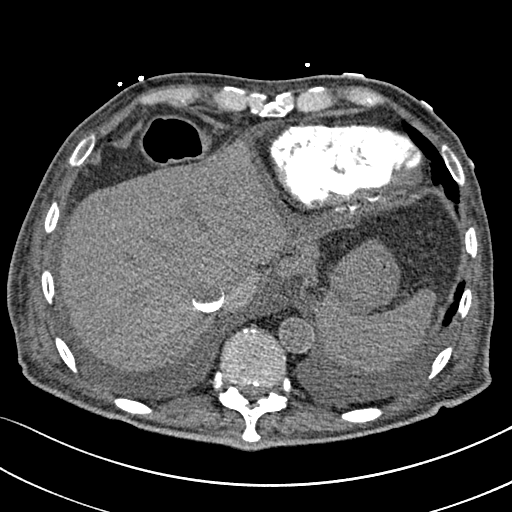
[im 82/272  lung]
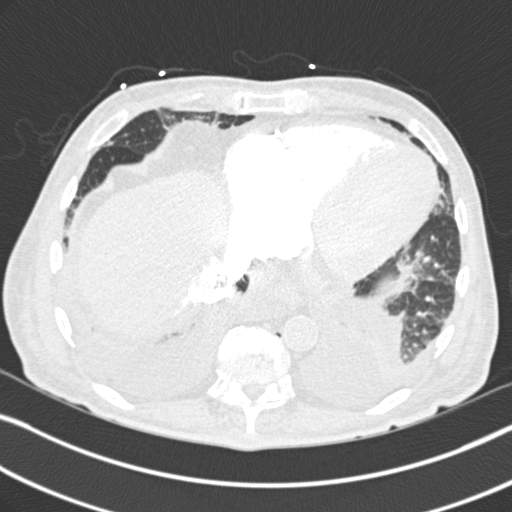
[im 95/272  mediastinal]
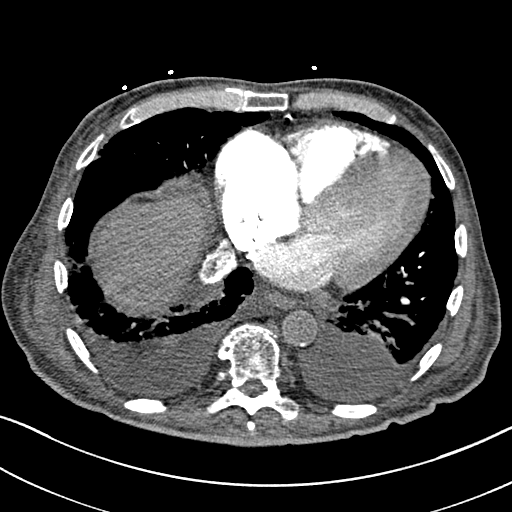
[im 109/272  lung]
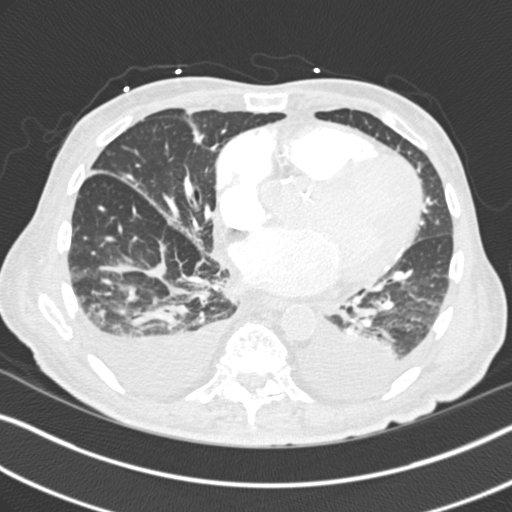
[im 122/272  mediastinal]
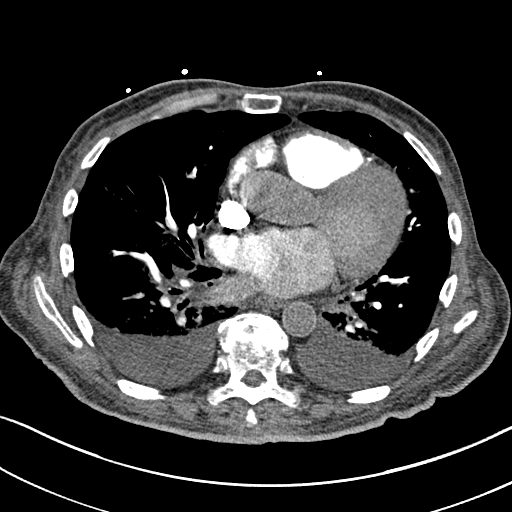
[im 136/272  lung]
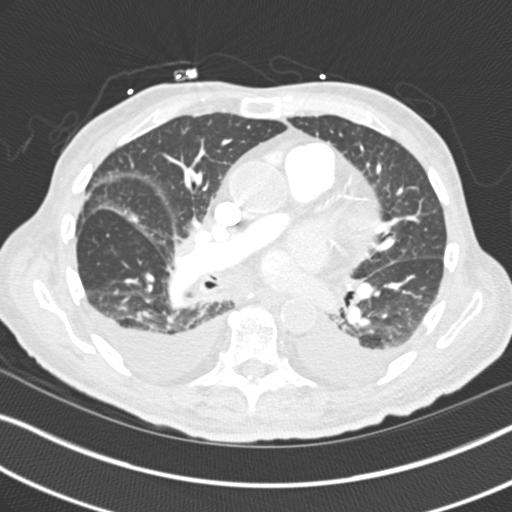
[im 150/272  mediastinal]
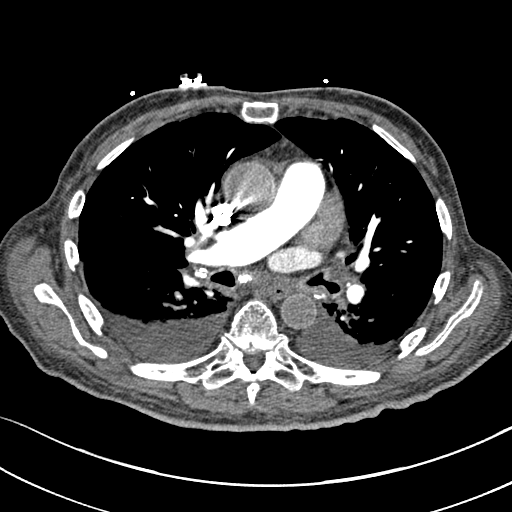
[im 163/272  lung]
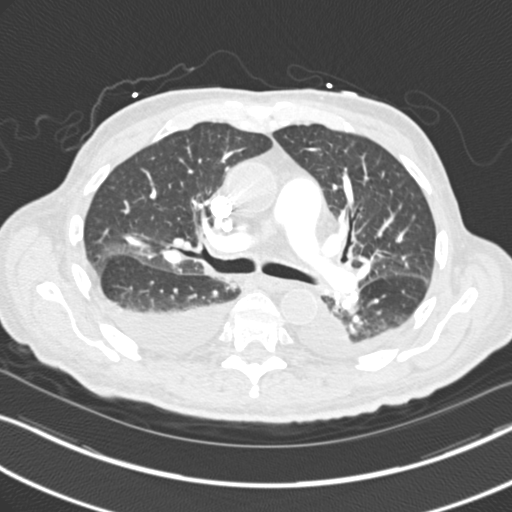
[im 177/272  mediastinal]
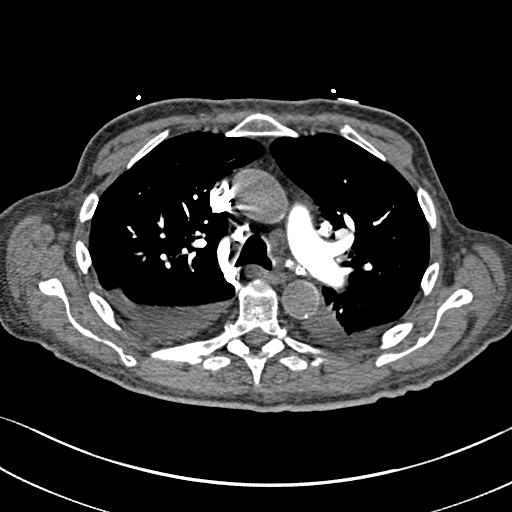
[im 190/272  lung]
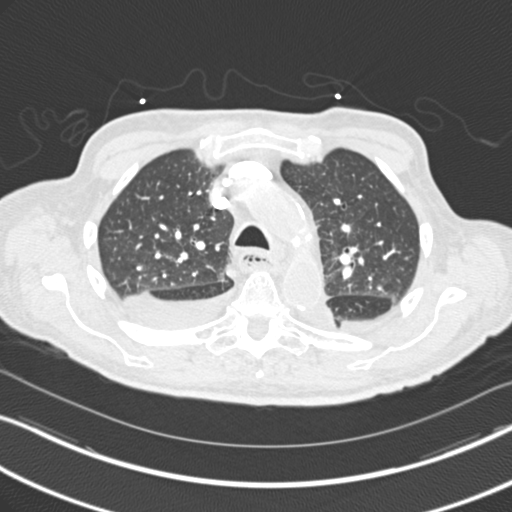
[im 217/272  mediastinal]
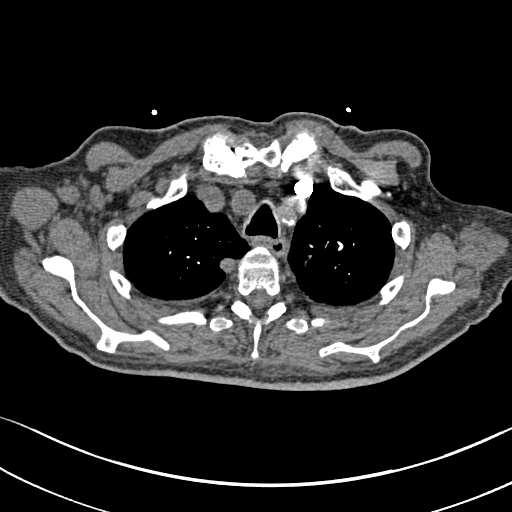
[im 231/272  lung]
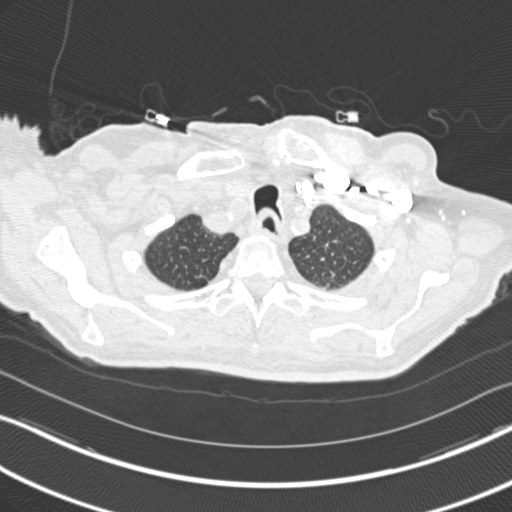
[im 244/272  mediastinal]
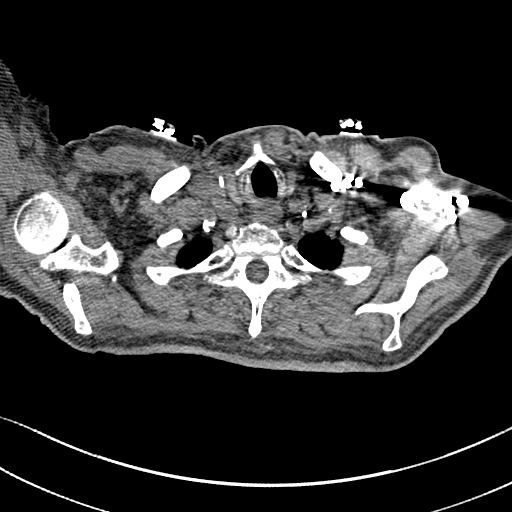
[im 258/272  lung]
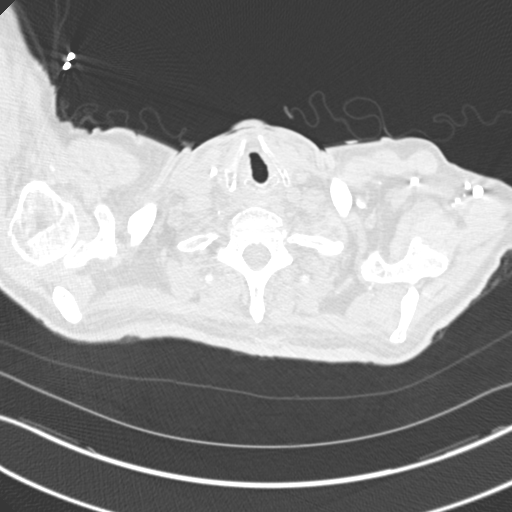

[Series 8: coronal mpr · coronal · 0.56mm/px · 1 of 128 slices shown]
[im 64/128  mediastinal]
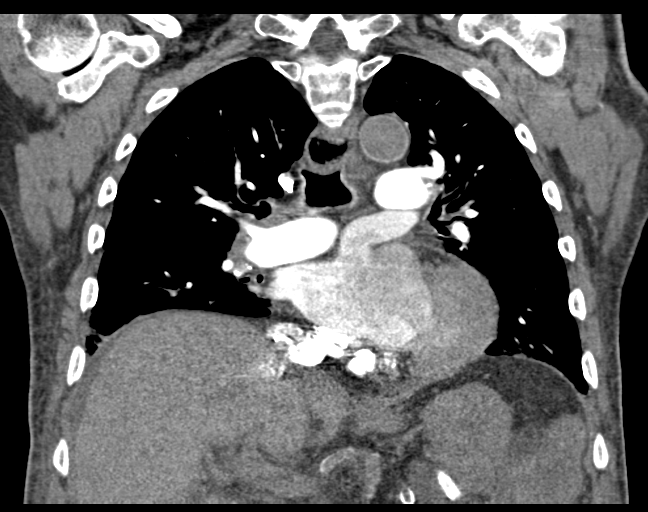

[18 of 36 positions shown; findings below may reference images not displayed]

FINDINGS: Cardiovascular: Cardiomegaly is noted. Atherosclerotic
calcifications of thoracic aorta. Extensive atherosclerotic
calcifications of coronary arteries. The study is of excellent
technical quality. No pulmonary embolus is noted.

Mediastinum/Nodes: There is a calcified right hilar lymph node axial
image 49 measures 6.6 mm probable prior granulomatous disease.
Multiple mediastinal calcified lymph nodes are noted probable prior
granulomatous disease. Bilateral hilar lymph nodes calcified.
Central airways are patent.

Lungs/Pleura: There is bilateral small pleural effusion. Bilateral
lower lobe bronchitic changes are noted. Mild bronchitic changes are
noted right middle lobe. There is small atelectasis or infiltrate in
left lower lobe posteriorly retrocardiac. No pulmonary edema is
noted.

Upper Abdomen: The visualized upper abdomen shows extensive
atherosclerotic calcifications of splenic artery. Again noted is
irregular soft tissue density in splenic hilum suspicious for
pancreatic malignancy.

Musculoskeletal: No destructive bony lesions are noted. Osteopenia
and degenerative changes thoracic spine.

Review of the MIP images confirms the above findings.
IMPRESSION: 1. No pulmonary embolus is noted.
2. Extensive atherosclerotic calcifications of coronary arteries.
Cardiomegaly noted.
3. Bilateral small pleural effusion. Bilateral lower lobe bronchitic
changes. Mild bronchitic changes right middle lobe. There is small
atelectasis or infiltrate in left lower lobe posterior medially
retrocardiac.
4. Bilateral hilar and mediastinal calcified lymph nodes probable
prior granulomatous disease.
5. No pulmonary edema.
6. Degenerative changes thoracic spine.
